# Patient Record
Sex: Female | Born: 1940 | Race: White | Smoking: Never smoker
Health system: Northeastern US, Academic
[De-identification: ages and names within clinical notes are randomized; demographics above are authoritative.]

## PROBLEM LIST (undated history)

## (undated) VITALS — BP 128/62 | HR 56 | Temp 97.3°F | Resp 16 | Ht 66.0 in | Wt 199.0 lb

## (undated) DIAGNOSIS — C50919 Malignant neoplasm of unspecified site of unspecified female breast: Secondary | ICD-10-CM

## (undated) DIAGNOSIS — W5503XA Scratched by cat, initial encounter: Secondary | ICD-10-CM

## (undated) DIAGNOSIS — E785 Hyperlipidemia, unspecified: Secondary | ICD-10-CM

## (undated) DIAGNOSIS — T7840XA Allergy, unspecified, initial encounter: Secondary | ICD-10-CM

## (undated) DIAGNOSIS — IMO0001 Reserved for inherently not codable concepts without codable children: Secondary | ICD-10-CM

## (undated) DIAGNOSIS — M719 Bursopathy, unspecified: Secondary | ICD-10-CM

## (undated) DIAGNOSIS — C8590 Non-Hodgkin lymphoma, unspecified, unspecified site: Secondary | ICD-10-CM

## (undated) DIAGNOSIS — R030 Elevated blood-pressure reading, without diagnosis of hypertension: Secondary | ICD-10-CM

## (undated) DIAGNOSIS — E119 Type 2 diabetes mellitus without complications: Secondary | ICD-10-CM

## (undated) DIAGNOSIS — R600 Localized edema: Secondary | ICD-10-CM

## (undated) DIAGNOSIS — L0390 Cellulitis, unspecified: Secondary | ICD-10-CM

## (undated) DIAGNOSIS — S80812A Abrasion, left lower leg, initial encounter: Secondary | ICD-10-CM

## (undated) HISTORY — DX: Malignant neoplasm of unspecified site of unspecified female breast: C50.919

## (undated) HISTORY — PX: TONSILLECTOMY: SHX28A

## (undated) HISTORY — PX: ROTATOR CUFF REPAIR: SHX139

## (undated) HISTORY — PX: ABDOMINAL HYSTERECTOMY: SHX81

## (undated) HISTORY — DX: Non-Hodgkin lymphoma, unspecified, unspecified site: C85.90

## (undated) HISTORY — DX: Reserved for inherently not codable concepts without codable children: IMO0001

## (undated) HISTORY — DX: Type 2 diabetes mellitus without complications: E11.9

## (undated) HISTORY — PX: APPENDECTOMY: SHX54

## (undated) HISTORY — PX: BILATERAL OOPHORECTOMY: SHX1221

## (undated) HISTORY — DX: Elevated blood-pressure reading, without diagnosis of hypertension: R03.0

## (undated) HISTORY — DX: Allergy, unspecified, initial encounter: T78.40XA

---

## 1990-10-25 DIAGNOSIS — C8590 Non-Hodgkin lymphoma, unspecified, unspecified site: Secondary | ICD-10-CM

## 1990-10-25 HISTORY — DX: Non-Hodgkin lymphoma, unspecified, unspecified site: C85.90

## 2005-09-07 ENCOUNTER — Ambulatory Visit: Payer: Self-pay | Admitting: Family Medicine

## 2005-11-15 ENCOUNTER — Ambulatory Visit: Payer: Self-pay | Admitting: Family Medicine

## 2005-11-22 ENCOUNTER — Ambulatory Visit: Payer: Self-pay | Admitting: Family Medicine

## 2006-01-14 ENCOUNTER — Encounter: Admission: RE | Admit: 2006-01-14 | Discharge: 2006-01-14 | Payer: Self-pay | Admitting: Family Medicine

## 2006-02-18 ENCOUNTER — Encounter: Admission: RE | Admit: 2006-02-18 | Discharge: 2006-02-18 | Payer: Self-pay | Admitting: Family Medicine

## 2006-06-10 ENCOUNTER — Ambulatory Visit: Payer: Self-pay | Admitting: Family Medicine

## 2006-08-12 ENCOUNTER — Ambulatory Visit: Payer: Self-pay | Admitting: Family Medicine

## 2006-08-18 ENCOUNTER — Encounter: Admission: RE | Admit: 2006-08-18 | Discharge: 2006-08-18 | Payer: Self-pay | Admitting: Family Medicine

## 2007-05-17 ENCOUNTER — Ambulatory Visit: Payer: Self-pay | Admitting: Family Medicine

## 2007-05-17 DIAGNOSIS — J309 Allergic rhinitis, unspecified: Secondary | ICD-10-CM | POA: Insufficient documentation

## 2007-05-17 DIAGNOSIS — Z87898 Personal history of other specified conditions: Secondary | ICD-10-CM

## 2007-05-17 DIAGNOSIS — R609 Edema, unspecified: Secondary | ICD-10-CM | POA: Insufficient documentation

## 2007-05-18 LAB — CONVERTED CEMR LAB
Albumin: 3.9 g/dL (ref 3.5–5.2)
Alkaline Phosphatase: 86 units/L (ref 39–117)
BUN: 11 mg/dL (ref 6–23)
Basophils Absolute: 0 10*3/uL (ref 0.0–0.1)
GFR calc Af Amer: 93 mL/min
Hemoglobin: 13.4 g/dL (ref 12.0–15.0)
Lymphocytes Relative: 31.6 % (ref 12.0–46.0)
MCHC: 34.7 g/dL (ref 30.0–36.0)
MCV: 88.1 fL (ref 78.0–100.0)
Monocytes Absolute: 0.5 10*3/uL (ref 0.2–0.7)
Monocytes Relative: 8 % (ref 3.0–11.0)
Neutro Abs: 3.8 10*3/uL (ref 1.4–7.7)
Neutrophils Relative %: 58.4 % (ref 43.0–77.0)
Potassium: 4.2 meq/L (ref 3.5–5.1)
Sodium: 143 meq/L (ref 135–145)
TSH: 4.11 microintl units/mL (ref 0.35–5.50)
Total Protein: 7.1 g/dL (ref 6.0–8.3)

## 2007-05-19 ENCOUNTER — Ambulatory Visit: Payer: Self-pay

## 2007-07-12 ENCOUNTER — Encounter: Admission: RE | Admit: 2007-07-12 | Discharge: 2007-07-12 | Payer: Self-pay | Admitting: Family Medicine

## 2007-10-26 HISTORY — PX: OTHER SURGICAL HISTORY: SHX169

## 2008-06-19 ENCOUNTER — Ambulatory Visit: Payer: Self-pay | Admitting: Family Medicine

## 2008-06-19 DIAGNOSIS — M654 Radial styloid tenosynovitis [de Quervain]: Secondary | ICD-10-CM

## 2008-06-19 DIAGNOSIS — L989 Disorder of the skin and subcutaneous tissue, unspecified: Secondary | ICD-10-CM | POA: Insufficient documentation

## 2008-08-06 ENCOUNTER — Ambulatory Visit: Payer: Self-pay | Admitting: Family Medicine

## 2008-08-06 LAB — CONVERTED CEMR LAB
AST: 59 units/L — ABNORMAL HIGH (ref 0–37)
Albumin: 4 g/dL (ref 3.5–5.2)
BUN: 13 mg/dL (ref 6–23)
Basophils Absolute: 0 10*3/uL (ref 0.0–0.1)
Basophils Relative: 0.4 % (ref 0.0–3.0)
Calcium: 9.2 mg/dL (ref 8.4–10.5)
Cholesterol: 191 mg/dL (ref 0–200)
Creatinine, Ser: 0.9 mg/dL (ref 0.4–1.2)
Eosinophils Absolute: 0 10*3/uL (ref 0.0–0.7)
Eosinophils Relative: 1.2 % (ref 0.0–5.0)
GFR calc Af Amer: 81 mL/min
GFR calc non Af Amer: 67 mL/min
HCT: 41.9 % (ref 36.0–46.0)
MCHC: 35.1 g/dL (ref 30.0–36.0)
MCV: 89.9 fL (ref 78.0–100.0)
Monocytes Absolute: 0.6 10*3/uL (ref 0.1–1.0)
Neutro Abs: 2.1 10*3/uL (ref 1.4–7.7)
Neutrophils Relative %: 54.4 % (ref 43.0–77.0)
RBC: 4.66 M/uL (ref 3.87–5.11)
TSH: 4.16 microintl units/mL (ref 0.35–5.50)
Total Bilirubin: 1 mg/dL (ref 0.3–1.2)
VLDL: 21 mg/dL (ref 0–40)
WBC: 3.9 10*3/uL — ABNORMAL LOW (ref 4.5–10.5)

## 2008-08-07 ENCOUNTER — Encounter: Payer: Self-pay | Admitting: Family Medicine

## 2008-08-08 LAB — CONVERTED CEMR LAB
Bilirubin Urine: NEGATIVE
Glucose, Urine, Semiquant: NEGATIVE
Specific Gravity, Urine: 1.025
WBC Urine, dipstick: NEGATIVE
pH: 5.5

## 2008-08-13 ENCOUNTER — Ambulatory Visit: Payer: Self-pay | Admitting: Family Medicine

## 2008-08-13 DIAGNOSIS — E119 Type 2 diabetes mellitus without complications: Secondary | ICD-10-CM | POA: Insufficient documentation

## 2008-08-13 DIAGNOSIS — E785 Hyperlipidemia, unspecified: Secondary | ICD-10-CM

## 2009-01-06 ENCOUNTER — Ambulatory Visit: Payer: Self-pay | Admitting: Gastroenterology

## 2009-01-10 ENCOUNTER — Telehealth: Payer: Self-pay | Admitting: Gastroenterology

## 2009-01-13 ENCOUNTER — Ambulatory Visit: Payer: Self-pay | Admitting: Gastroenterology

## 2009-01-13 HISTORY — PX: OTHER SURGICAL HISTORY: SHX169

## 2009-08-19 ENCOUNTER — Ambulatory Visit: Payer: Self-pay | Admitting: Family Medicine

## 2009-08-19 LAB — CONVERTED CEMR LAB
Blood in Urine, dipstick: NEGATIVE
Glucose, Urine, Semiquant: NEGATIVE
Ketones, urine, test strip: NEGATIVE
Nitrite: NEGATIVE
Specific Gravity, Urine: 1.02
pH: 5

## 2009-08-27 ENCOUNTER — Ambulatory Visit: Payer: Self-pay | Admitting: Family Medicine

## 2009-08-27 LAB — CONVERTED CEMR LAB
Albumin: 3.9 g/dL (ref 3.5–5.2)
BUN: 13 mg/dL (ref 6–23)
Basophils Absolute: 0 10*3/uL (ref 0.0–0.1)
CO2: 27 meq/L (ref 19–32)
Calcium: 8.8 mg/dL (ref 8.4–10.5)
Creatinine,U: 109.4 mg/dL
Eosinophils Absolute: 0.1 10*3/uL (ref 0.0–0.7)
Glucose, Bld: 171 mg/dL — ABNORMAL HIGH (ref 70–99)
HCT: 40.7 % (ref 36.0–46.0)
Hemoglobin: 14 g/dL (ref 12.0–15.0)
Hgb A1c MFr Bld: 6.6 % — ABNORMAL HIGH (ref 4.6–6.5)
Lymphs Abs: 1.7 10*3/uL (ref 0.7–4.0)
MCHC: 34.5 g/dL (ref 30.0–36.0)
Microalb, Ur: 1.7 mg/dL (ref 0.0–1.9)
Neutro Abs: 3.4 10*3/uL (ref 1.4–7.7)
Platelets: 187 10*3/uL (ref 150.0–400.0)
Potassium: 4.2 meq/L (ref 3.5–5.1)
RDW: 13.9 % (ref 11.5–14.6)
Sodium: 141 meq/L (ref 135–145)
TSH: 4.82 microintl units/mL (ref 0.35–5.50)
Triglycerides: 175 mg/dL — ABNORMAL HIGH (ref 0.0–149.0)

## 2009-09-03 ENCOUNTER — Encounter: Admission: RE | Admit: 2009-09-03 | Discharge: 2009-09-03 | Payer: Self-pay | Admitting: Family Medicine

## 2009-09-05 ENCOUNTER — Encounter (INDEPENDENT_AMBULATORY_CARE_PROVIDER_SITE_OTHER): Payer: Self-pay | Admitting: *Deleted

## 2009-09-05 ENCOUNTER — Encounter: Admission: RE | Admit: 2009-09-05 | Discharge: 2009-09-05 | Payer: Self-pay | Admitting: Family Medicine

## 2009-09-05 HISTORY — PX: DIAGNOSTIC MAMMOGRAM: HXRAD719

## 2009-10-14 ENCOUNTER — Encounter: Admission: RE | Admit: 2009-10-14 | Discharge: 2009-10-22 | Payer: Self-pay | Admitting: Family Medicine

## 2009-10-15 ENCOUNTER — Encounter: Payer: Self-pay | Admitting: Family Medicine

## 2010-05-14 ENCOUNTER — Encounter: Payer: Self-pay | Admitting: Family Medicine

## 2010-07-14 ENCOUNTER — Ambulatory Visit: Payer: Self-pay | Admitting: Family Medicine

## 2010-07-14 DIAGNOSIS — M549 Dorsalgia, unspecified: Secondary | ICD-10-CM | POA: Insufficient documentation

## 2010-07-14 LAB — CONVERTED CEMR LAB
Nitrite: NEGATIVE
Protein, U semiquant: NEGATIVE
Urobilinogen, UA: 0.2
WBC Urine, dipstick: NEGATIVE

## 2010-07-15 ENCOUNTER — Telehealth: Payer: Self-pay | Admitting: Family Medicine

## 2010-07-15 LAB — CONVERTED CEMR LAB
Albumin: 4.3 g/dL (ref 3.5–5.2)
Alkaline Phosphatase: 87 units/L (ref 39–117)
BUN: 16 mg/dL (ref 6–23)
Basophils Absolute: 0 10*3/uL (ref 0.0–0.1)
CO2: 27 meq/L (ref 19–32)
Calcium: 9.6 mg/dL (ref 8.4–10.5)
Creatinine, Ser: 0.9 mg/dL (ref 0.4–1.2)
Creatinine,U: 98.7 mg/dL
Eosinophils Absolute: 0.1 10*3/uL (ref 0.0–0.7)
Glucose, Bld: 224 mg/dL — ABNORMAL HIGH (ref 70–99)
Hemoglobin: 14.2 g/dL (ref 12.0–15.0)
Lymphocytes Relative: 31.7 % (ref 12.0–46.0)
Lymphs Abs: 1.8 10*3/uL (ref 0.7–4.0)
MCHC: 34.8 g/dL (ref 30.0–36.0)
Microalb, Ur: 1 mg/dL (ref 0.0–1.9)
Neutro Abs: 3.4 10*3/uL (ref 1.4–7.7)
RDW: 15 % — ABNORMAL HIGH (ref 11.5–14.6)
Sodium: 137 meq/L (ref 135–145)
TSH: 7 microintl units/mL — ABNORMAL HIGH (ref 0.35–5.50)

## 2010-07-16 ENCOUNTER — Telehealth: Payer: Self-pay | Admitting: Family Medicine

## 2010-07-20 ENCOUNTER — Telehealth: Payer: Self-pay | Admitting: Family Medicine

## 2010-11-15 ENCOUNTER — Encounter: Payer: Self-pay | Admitting: Family Medicine

## 2010-11-24 NOTE — Progress Notes (Signed)
Summary: Please return call  Phone Note Call from Patient Call back at Home Phone [phone]   Caller: Patient---live call Summary of Call: pt wants Dr Clent Ridges to return her call personally regarding her thyroid issue. Refused to speak with a nurse. Initial call taken by: Warnell Forester,  July 16, 2010 1:39 PM  Follow-up for Phone Call        She needs to speak to my nurse or else schedule an OV  Follow-up by: Nelwyn Salisbury MD,  July 17, 2010 8:34 AM  Additional Follow-up for Phone Call Additional follow up Details #1::        pt stated go ahead and call in synthriod to [facility]  and wants to make sure letter will be mailed Additional Follow-up by: Pura Spice, RN,  July 17, 2010 9:09 AM    Additional Follow-up for Phone Call Additional follow up Details #2::    done Follow-up by: Nelwyn Salisbury MD,  July 17, 2010 9:53 AM

## 2010-11-24 NOTE — Assessment & Plan Note (Signed)
Summary: CONSULT RE: BACK ISSUES/CJR   Vital Signs:  Patient profile:   70 year old female Weight:      214 pounds BMI:     38.66 O2 Sat:      97 % Temp:     98.1 degrees F Pulse rate:   89 / minute BP sitting:   140 / 80  (left arm) Cuff size:   large  Vitals Entered By: Pura Spice, RN (July 14, 2010 9:50 AM)  Contraindications/Deferment of Procedures/Staging:    Test/Procedure: FLU VAX    Reason for deferment: patient declined  CC: would like xr back    History of Present Illness: 70 yr old female for a brief wellness evaluation related to her employment. 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 Part of this exam involved Xrays of her spine, and she was told that she had more signs of "degeneration" in her spine than what this company allows their workers to have. Consequently she was fired. Her job involves lifting weights up to about 20 lbs, but it does not invlove any heavy lifting. She recently applied for unemployment benefits, and they told her to get a statement from me to certify that she is healthy enough to work. She does have some mild low back pain and stiffness from time to time, and she gets good relief by taking one or two Aleeve tablets a day. This has never prevented her from working. otherwise she feels fine. Her BP at home is stable in the range of 110-120 over 70-80.   Allergies: 1)  ! Penicillin  Past History:  Past Medical History: Reviewed history from 08/27/2009 and no changes required. Allergic rhinitis Non Hodgkins  lymphoma 1992, resolved elevated BP Diabetes mellitus, type II  Past Surgical History: Reviewed history from 08/27/2009 and no changes required. Appendectomy Hysterectomy Oophorectomy-bilateral removal of basal cell carcinoma from left upper arm 2009 per Dr. Terri Piedra colonoscopy  01-13-09 per Dr. Jarold Motto, diverticulosis only, repeat in 10 yrs  Family History: Reviewed history from 08/13/2008 and no changes required. Family History Breast cancer 1st degree relative <50 Family History of CAD Female 1st degree relative <50 Family History Diabetes 1st degree relative Family History of Prostate CA 1st degree relative <50  Social History: Reviewed history from 08/13/2008 and no changes required. Widow/Widower Alcohol use-yes Drug use-no Regular exercise-yes Former smoker  Review of Systems  The patient denies anorexia, fever, weight loss, weight gain, vision loss, decreased hearing, hoarseness, chest pain, syncope, dyspnea on exertion, peripheral edema, prolonged cough, headaches, hemoptysis, abdominal pain, melena, hematochezia, severe indigestion/heartburn, hematuria, incontinence, genital sores, muscle weakness, suspicious skin lesions, transient blindness, difficulty walking, depression, unusual weight change, abnormal bleeding, enlarged lymph nodes, angioedema, breast masses, and testicular masses.    Physical Exam  General:  overweight-appearing.   Head:  Normocephalic and atraumatic without obvious abnormalities. No apparent alopecia or balding. Eyes:  No corneal or conjunctival inflammation noted. EOMI. Perrla. Funduscopic exam benign, without hemorrhages, exudates or papilledema. Vision grossly normal. Ears:  External ear exam shows no significant lesions or deformities.  Otoscopic examination reveals clear canals, tympanic membranes are intact bilaterally without bulging, retraction, inflammation or discharge. Hearing is grossly normal bilaterally. Nose:  External nasal examination shows no deformity or inflammation. Nasal mucosa are pink and moist without lesions or exudates. Mouth:  Oral mucosa and  oropharynx without lesions or exudates.  Teeth in good repair. Neck:  No deformities, masses, or tenderness noted. Chest Wall:  No deformities, masses, or  tenderness noted. Lungs:  Normal respiratory effort, chest expands symmetrically. Lungs are clear to auscultation, no crackles or wheezes. Heart:  Normal rate and regular rhythm. S1 and S2 normal without gallop, murmur, click, rub or other extra sounds. Abdomen:  Bowel sounds positive,abdomen soft and non-tender without masses, organomegaly or hernias noted. Msk:  No deformity or scoliosis noted of thoracic or lumbar spine.  No tenderness of the spine or neck, full ROM.  Pulses:  R and L carotid,radial,femoral,dorsalis pedis and posterior tibial pulses are full and equal bilaterally Extremities:  No clubbing, cyanosis, edema, or deformity noted with normal full range of motion of all joints.   Neurologic:  No cranial nerve deficits noted. Station and gait are normal. Plantar reflexes are down-going bilaterally. DTRs are symmetrical throughout. Sensory, motor and coordinative functions appear intact. Skin:  Intact without suspicious lesions or rashes Cervical Nodes:  No lymphadenopathy noted Psych:  Cognition and judgment appear intact. Alert and cooperative with normal attention span and concentration. No apparent delusions, illusions, hallucinations   Impression & Recommendations:  Problem # 1:  WELL ADULT EXAM (ICD-V70.0)  Orders: UA Dipstick w/o Micro (automated)  (81003) Venipuncture (36644) TLB-BMP (Basic Metabolic Panel-BMET) (80048-METABOL) TLB-CBC Platelet - w/Differential (85025-CBCD) TLB-Hepatic/Liver Function Pnl (80076-HEPATIC) TLB-TSH (Thyroid Stimulating Hormone) (84443-TSH) Specimen Handling (03474)  Complete Medication List: 1)  Claritin 10 Mg Tabs (Loratadine) .... Once daily 2)  Multivitamins Tabs (Multiple vitamin) .... Once daily 3)  B-12 Microlozenge 500 Mcg Subl (Cyanocobalamin) .... Once daily 4)  Flax Oil (Flaxseed (linseed)) .... Once daily 5)  Red Yeast Rice Powd (Red yeast rice extract) .... Once daily 6)  Vitamin B Complex-c Caps (B complex-c) .... Once  daily 7)  Calcium Carbonate-vitamin D 600-400 Mg-unit Tabs (Calcium carbonate-vitamin d) .... Once daily 8)  Nattokinase 100 Mg Caps (Nattokinase) .... Once daily 9)  Hops Valerian  .... Once daily 10)  Probiotics  .... Once daily 11)  Ash [place]  .... Once daily  Other Orders: TLB-A1C / Hgb A1C (Glycohemoglobin) (83036-A1C) TLB-Microalbumin/Creat Ratio, Urine (82043-MALB) T-Cervical Spine Comp 4 Views (25956LO) T-Thoracic Spine 2 Views (75643PI) T-Lumbar Spine 2 Views (72100TC)  Patient Instructions: 1)  It is important that you exercise reguarly at least 20 minutes 5 times a week. If you develop chest pain, have severe difficulty breathing, or feel very tired, stop exercising immediately and seek medical attention.  2)  You need to lose weight. Consider a lower calorie diet and regular exercise.  3)  Get labs today.  4)  She will get Xrays of the neck and spine later today.  5)  After these results are back, we can make a statement as to her health and ability to work.   Laboratory Results   Urine Tests  Date/Time Recieved: July 14, 2010 11:14 AM  Date/Time Reported: July 14, 2010 11:14 AM   Routine Urinalysis   Color: yellow Appearance: Clear Glucose: 1+   (Normal Range: Negative) Bilirubin: negative   (Normal Range: Negative) Ketone: negative   (Normal Range: Negative) Spec. Gravity: 1.025   (Normal Range: 1.003-1.035) Blood: negative   (Normal Range: Negative) pH: 5.0   (Normal Range: 5.0-8.0) Protein: negative   (Normal Range: Negative) Urobilinogen: 0.2   (Normal Range: 0-1) Nitrite: negative   (Normal Range: Negative) Leukocyte Esterace: negative   (Normal Range: Negative)    Comments: [place]  Jeanmarie Plant, CMA  July 14, 2010 11:14 AM      Appended Document: CONSULT RE: BACK ISSUES/CJR     Allergies: 1)  ! Penicillin   Impression & Recommendations:  Problem # 1:  BACK PAIN (ICD-724.5)  Complete Medication List: 1)  Claritin 10 Mg  Tabs (Loratadine) .... Once daily 2)  Multivitamins Tabs (Multiple vitamin) .... Once daily 3)  B-12 Microlozenge 500 Mcg Subl (Cyanocobalamin) .... Once daily 4)  Flax Oil (Flaxseed (linseed)) .... Once daily 5)  Red Yeast Rice Powd (Red yeast rice extract) .... Once daily 6)  Vitamin B Complex-c Caps (B complex-c) .... Once daily 7)  Calcium Carbonate-vitamin D 600-400 Mg-unit Tabs (Calcium carbonate-vitamin d) .... Once daily 8)  Nattokinase 100 Mg Caps (Nattokinase) .... Once daily 9)  Hops Valerian  .... Once daily 10)  Probiotics  .... Once daily 11)  Ash [place]ag[place]  .... Once daily 12)  Synthroid 50 Mcg Tabs (Levothyroxine sodium) .Marland Kitchen.. 1 by mouth once daily

## 2010-11-24 NOTE — Progress Notes (Signed)
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 Initial call taken by: Pura Spice, RN,  July 15, 2010 1:01 PM  Follow-up for Phone Call        a letter was dictated today Follow-up by: Nelwyn Salisbury MD,  July 15, 2010 1:13 PM  Additional Follow-up for Phone Call Additional follow up Details #1::        pt aware.  Additional Follow-up by: Pura Spice, RN,  July 15, 2010 2:01 PM

## 2010-11-24 NOTE — Progress Notes (Signed)
Summary: REQUEST FOR Rx  Phone Note Call from Patient   Caller:  PATIENT Summary of Call: Pt adv that Rx for Synthroid was not sent into [facility]..... Pt adv they ([facility]) told her that Rx was not received ....Marland KitchenMarland KitchenTherefore, pt wants a written script for medication left up front for her to p/u...Marland KitchenMarland KitchenAlso, requested verification that letter she requested was faxed to [phone] (for unemployment).  Initial call taken by: Debbra Riding,  July 20, 2010 12:18 PM  Follow-up for Phone Call        done  Follow-up by: Pura Spice, RN,  July 20, 2010 12:28 PM    New/Updated Medications: SYNTHROID 50 MCG TABS (LEVOTHYROXINE SODIUM) 1 by mouth once daily Prescriptions: SYNTHROID 50 MCG TABS (LEVOTHYROXINE SODIUM) 1 by mouth once daily  #30 x 11   Entered by:   Pura Spice, RN   Authorized by:   Nelwyn Salisbury MD   Signed by:   Pura Spice, RN on 07/20/2010   Method used:   Print then Give to Patient   RxID:   [id number]   Appended Document: REQUEST FOR Rx Patient informed that the letterhead copy is ready to be picked up.  Appended Document: REQUEST FOR Rx Pt came by office and was provided with copy of letter / document as well as the confirmation that it was faxed to # provided.

## 2010-12-08 ENCOUNTER — Other Ambulatory Visit: Payer: Self-pay | Admitting: Family Medicine

## 2010-12-08 DIAGNOSIS — Z1231 Encounter for screening mammogram for malignant neoplasm of breast: Secondary | ICD-10-CM

## 2010-12-21 ENCOUNTER — Ambulatory Visit
Admission: RE | Admit: 2010-12-21 | Discharge: 2010-12-21 | Disposition: A | Discharge status: Home / Self Care | Payer: Medicare Other | Source: Ambulatory Visit | Attending: Family Medicine | Admitting: Family Medicine

## 2010-12-21 DIAGNOSIS — Z1231 Encounter for screening mammogram for malignant neoplasm of breast: Secondary | ICD-10-CM

## 2011-01-01 ENCOUNTER — Other Ambulatory Visit: Payer: Self-pay | Admitting: Gastroenterology

## 2011-01-01 ENCOUNTER — Other Ambulatory Visit: Payer: Self-pay | Admitting: Cardiology

## 2011-01-01 ENCOUNTER — Ambulatory Visit
Admit: 2011-01-01 | Discharge: 2011-01-01 | Disposition: A | Discharge status: Home / Self Care | Payer: Self-pay | Admitting: Cardiology

## 2011-01-01 ENCOUNTER — Encounter: Payer: Self-pay | Admitting: Gastroenterology

## 2011-01-01 ENCOUNTER — Ambulatory Visit
Admit: 2011-01-01 | Discharge: 2011-01-01 | Disposition: A | Discharge status: Home / Self Care | Payer: Self-pay | Source: Ambulatory Visit | Attending: Cardiology | Admitting: Cardiology

## 2011-01-01 ENCOUNTER — Ambulatory Visit: Admit: 2011-01-01 | Discharge: 2011-01-01 | Disposition: A | Discharge status: Home / Self Care | Payer: Self-pay

## 2011-01-01 ENCOUNTER — Encounter: Payer: Self-pay | Admitting: Cardiology

## 2011-01-01 DIAGNOSIS — I10 Essential (primary) hypertension: Secondary | ICD-10-CM | POA: Insufficient documentation

## 2011-01-01 DIAGNOSIS — R002 Palpitations: Secondary | ICD-10-CM | POA: Insufficient documentation

## 2011-01-01 HISTORY — DX: Palpitations: R00.2

## 2011-01-01 HISTORY — DX: Essential (primary) hypertension: I10

## 2011-01-01 LAB — EKG 12-LEAD
P: 58 degrees
QRS: 44 degrees
Rate: 67 {beats}/min
Severity: NORMAL
Severity: NORMAL
Statement: NORMAL
T: 17 degrees
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
## 2011-01-01 NOTE — Procedures (Signed)
 [facility]   STRESS ECHOCARDIOGRAM     NAME: Stacie Kim  MRN: [id number]  ID: [id number]  DOB: 12-12-1940  PROCEDURE DATE: January 01, 2011  REFERRING PROVIDER: ROBERT MENESES     HT: 2 in  WT: 209 lbs        Medications:  [employer] Med List reviewed with patient   Allergies: NKDA       Location: Outpatient   Technical Quality: Diagnostic.     Echo contrast:   Optison (perflutren lipid microspheres) used in small   diluted incremental doses to enhance endocardial border definition.  IV access:  #20 ga. IV placed in Rt. AF for Optison doses.  IV discontinued   after stress test completed without incident.  Comments:   Indication:  HTN, CHF, Dyspnea     The patient's identity was confirmed using at least two separate   identifiers during this visit and appropriate test/procedure was verified   with physician order : yesencounter form 3/9 Dr. Delton See.     Patient instructed and verbalizes understanding of test procedure: Yes     Other than cardiac symptoms, is patient experiencing pain that impacts   their ability to complete the test?  No    If yes, rate on 0-10 scale:       CARDIAC RISK FACTORS: Hypertension, Family History: Father and Brothers.     Daughters with Arrythmias, Other:       CARDIAC HISTORY   Myocardial infarction: None.   Cardiac catheterization: None.   CABG: None.   PCI/stenting: None.   Prior stress testing:  Stress Echo 2009 in [place].         HEMODYNAMICS     BASELINE / STRESS       --Resting heart rate: 63 bpm                                         --Max heart rate: 130 bpm       --Resting blood pressure: 140/60 mmHg       --Maximum blood pressure:  184/60 mmHg       --Resting EKG analysis: normal sinus rhythm.       --Percent of predicted maximum heart rate achieved: 85 percent            STRESS TEST PROCEDURE: Standard Bruce   per protocol          --Exercise Time: 4 minutes 43 seconds       --METS: 7.0        --Blood Pressure Response:Normal.         --Heart rate response: Normal.           --ECG analysis with stress:  no ischemic changes.         --Arrhythmias: Occasional PVC and  bigeminy with one triplet noted   during exercise.  SVT at a rate of 163, lasting aprox 30 seconds,  noted in   early recovery.         --Chest Pain: None reported.        --Reason for termination: Fatigue. Target heart rate reached.     Dizziness.     DRUGS ADMINISTERED:    Optison, (perflutren lipid microspheres) used in   small diluted incremental doses        BASELINE ECHOCARDIOGRAM     Left ventricular wall motion:  Normal resting left ventricular wall  motion.   LVEF:  55-60 percent    RVdd: 2.7cm; LVed: 4.7cm; IVSd: 1.0cm; PWd: 1.0cm; Ao: 3.2cm; LA: 4.5cm      Mitral regurgitation:  Trace to mild.     Aortic regurgitation: None.      Tricuspid regurgitation:  Trace.      Pulmonic regurgitation:  Trace to mild.               Right ventricular systolic pressure estimate: N/A  Other Findings:   Left atrial enlargement.                      ECHOCARDIOGRAM AT PEAK EXERCISE     Left ventricular wall motion:  Mild augmentation in all segments with   reduction in LV end diastolic dimension.   LVEF: 70 percent  Stress-induced ischemia: None.    OTHER:        CONCLUSIONS  1.  Reduced aerobic capacity with no chest pain with exercise.  Transient   dizziness during exercise, not associated with arrhythmia.  2.  Normal resting LV systolic function with left atrial enlargement and no   significant valvular abnormalities.  3.  Negative stress ECG for ischemia at 85% of max predicted heart rate.    Transient run of supraventricular tachycardia without significant symptoms.  4.  Mild augmentation of LV systolic function with no regional wall motion   abnormalities with stress.  5.  Negative stress echocardiogram for myocardial ischemia at the cardiac   workload achieved.     Sonographer:    Ancil Linsey RDCS, RVT         Tech / RN:  Lowella Grip RN, [facility]         Interpreting Cardiologist:        Honor Junes  MD.  Signature    Electronically signed by: Honor Junes  M.D.; 01/01/2011 2:27 PM EST;   Chartered loss adjuster.

## 2011-01-01 NOTE — Miscellaneous (Unsigned)
 Continuity of Care Record  Created: todo  From: MENESES, ROBERT  From:   From: [employer], EHR v10.2.7.53  To: DESMOND, Bryleigh  Purpose: Patient Use;       Problems  Diagnosis: Hypertension (401.9)   Diagnosis: Palpitations (Symptom) (785.1)     Family History  Family history of Coronary Artery Disease  Family history of Hypertension (V17.49)     Alerts  Allergy - Codeine Derivatives Headache     Medications  Lisinopril 10 MG Tablet ; RPT   PriLOSEC 20 MG Capsule Delayed Release; TAKE 1 CAPSULE DAILY. ; RPT

## 2011-03-12 NOTE — Letter (Signed)
July 15, 2010     RE:  Marisa Davenport, Marisa Davenport  MRN:  [id number]  /  DOB:  Sep 11, 1941   To whom it may concern,   This letter is concerning a patient of mine by the name of Marisa Davenport  (Date of birth Mar 30, 1941).  I have been seeing this patient for  some years as her primary care physician, and I most recently saw her on  July 14, 2010, to address some questions that had been raised about  her medical status.  She recently started a new job and was given a bit  of an employment physical exam, and part of this evaluation included x-  rays of her spine.  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  Consequently, she was told that she could  not work for this company.  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  She is certainly capable of bending and stooping and  lifting small amount of weights which would be involved in such a job.  She has mild back discomfort from time to time which she treats with  some over-the-counter medications effectively.  She asked me to obtain x-  rays here and to reevaluate her employability.   Indeed, we did get x-rays of her cervical spine, thoracic spine, and  lumbar spine yesterday.  The cervical and thoracic spine x-rays showed  mild degenerative changes only and nothing of significance.  Her lumbar  spine x-ray did show some scoliosis and some more advanced degenerative  changes, but nothing of particular significance.  During my examination  of her in the office yesterday, she seemed to have more than adequate  strength of her back with good flexibility and good stability of the  spine and lower body.   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  I see no reason at all that she should be prevented from  performing her expected duties in an adequate and safe fashion.   If I may be of further assistance, please let me know.    Sincerely,      Tera Mater. Clent Ridges, MD  Electronically Signed    SAF/MedQ  DD: 07/15/2010  DT: 07/16/2010  Job #: [id number]

## 2011-04-09 ENCOUNTER — Ambulatory Visit: Payer: Self-pay | Admitting: Cardiology

## 2011-04-23 ENCOUNTER — Ambulatory Visit: Payer: Self-pay | Admitting: Cardiology

## 2011-05-17 ENCOUNTER — Encounter: Payer: Self-pay | Admitting: Family Medicine

## 2011-05-17 ENCOUNTER — Ambulatory Visit (INDEPENDENT_AMBULATORY_CARE_PROVIDER_SITE_OTHER): Payer: Medicare Other | Admitting: Family Medicine

## 2011-05-17 VITALS — BP 150/90 | HR 91 | Temp 99.1°F | Wt 212.0 lb

## 2011-05-17 DIAGNOSIS — S43499A Other sprain of unspecified shoulder joint, initial encounter: Secondary | ICD-10-CM

## 2011-05-17 DIAGNOSIS — S46219A Strain of muscle, fascia and tendon of other parts of biceps, unspecified arm, initial encounter: Secondary | ICD-10-CM

## 2011-05-17 NOTE — Progress Notes (Signed)
  Subjective:    Patient ID: Marisa Davenport, female    DOB: 07/02/1941, 70 y.o.   MRN: [id number]  HPI Here to follow up on a work injury that occurred on 04-23-11 while she was lifting a bed mattress. She felt a "pop" and a sudden sharp pain in the right shoulder. Over the next 24 hours she developed a large lump in the upper right arm and some bruising there. The arm has felt weak and been painful ever since.    Review of Systems  Constitutional: Negative.   Respiratory: Negative.   Cardiovascular: Negative.        Objective:   Physical Exam  Constitutional: She appears well-developed and well-nourished.  Musculoskeletal:       The anterior right shoulder is tender. ROM is full but flexing the upper arm is painful           Assessment & Plan:  Probable torn biceps tendon. Refer to orthopedics.

## 2011-05-21 ENCOUNTER — Ambulatory Visit (INDEPENDENT_AMBULATORY_CARE_PROVIDER_SITE_OTHER): Payer: Medicare Other | Admitting: Family Medicine

## 2011-05-21 ENCOUNTER — Encounter: Payer: Self-pay | Admitting: Family Medicine

## 2011-05-21 VITALS — BP 138/90 | HR 82 | Temp 98.8°F | Wt 210.0 lb

## 2011-05-21 DIAGNOSIS — K529 Noninfective gastroenteritis and colitis, unspecified: Secondary | ICD-10-CM

## 2011-05-21 DIAGNOSIS — K5289 Other specified noninfective gastroenteritis and colitis: Secondary | ICD-10-CM

## 2011-05-21 LAB — BASIC METABOLIC PANEL
BUN: 17 mg/dL (ref 6–23)
Chloride: 100 mEq/L (ref 96–112)
Creatinine, Ser: 0.9 mg/dL (ref 0.4–1.2)
Glucose, Bld: 115 mg/dL — ABNORMAL HIGH (ref 70–99)
Potassium: 2.9 mEq/L — ABNORMAL LOW (ref 3.5–5.1)

## 2011-05-21 LAB — CBC WITH DIFFERENTIAL/PLATELET
Basophils Absolute: 0 10*3/uL (ref 0.0–0.1)
Eosinophils Relative: 1.6 % (ref 0.0–5.0)
HCT: 38.9 % (ref 36.0–46.0)
Lymphocytes Relative: 26.5 % (ref 12.0–46.0)
Lymphs Abs: 1.7 10*3/uL (ref 0.7–4.0)
Monocytes Relative: 10.4 % (ref 3.0–12.0)
Neutrophils Relative %: 61.1 % (ref 43.0–77.0)
Platelets: 260 10*3/uL (ref 150.0–400.0)
RDW: 14.5 % (ref 11.5–14.6)
WBC: 6.6 10*3/uL (ref 4.5–10.5)

## 2011-05-21 LAB — HEPATIC FUNCTION PANEL
ALT: 46 U/L — ABNORMAL HIGH (ref 0–35)
AST: 34 U/L (ref 0–37)
Albumin: 3.9 g/dL (ref 3.5–5.2)
Total Bilirubin: 1 mg/dL (ref 0.3–1.2)

## 2011-05-21 LAB — AMYLASE: Amylase: 21 U/L — ABNORMAL LOW (ref 27–131)

## 2011-05-21 LAB — POCT URINALYSIS DIPSTICK
Leukocytes, UA: NEGATIVE
Nitrite, UA: NEGATIVE
Protein, UA: NEGATIVE
Urobilinogen, UA: 0.2
pH, UA: 5

## 2011-05-21 LAB — LIPASE: Lipase: 25 U/L (ref 11.0–59.0)

## 2011-05-21 MED ORDER — CIPROFLOXACIN HCL 500 MG PO TABS: 500.0000 mg | ORAL_TABLET | Freq: Two times a day (BID) | ORAL | Status: AC

## 2011-05-21 MED ORDER — PROMETHAZINE HCL 25 MG PO TABS: 25.0000 mg | ORAL_TABLET | Freq: Four times a day (QID) | ORAL | Status: AC | PRN

## 2011-05-21 MED ORDER — METRONIDAZOLE 500 MG PO TABS: 500.0000 mg | ORAL_TABLET | Freq: Two times a day (BID) | ORAL | Status: AC

## 2011-05-21 NOTE — Progress Notes (Signed)
  Subjective:    Patient ID: Marisa Davenport, female    DOB: 05/07/1941, 70 y.o.   MRN: [id number]  HPI Here for one week of nausea and vomiting, mild diffuse abdominal cramps, and diarrhea. No fever. She is able to keep liquids down but cannot eat much solid food.    Review of Systems  Constitutional: Negative.   Respiratory: Negative.   Cardiovascular: Negative.   Gastrointestinal: Positive for nausea, vomiting, abdominal pain and diarrhea. Negative for constipation, blood in stool, abdominal distention and anal bleeding.       Objective:   Physical Exam  Constitutional: She appears well-developed and well-nourished.  Abdominal: Soft. Bowel sounds are normal. She exhibits no distension and no mass. There is no rebound and no guarding.       Mild diffuse tenderness          Assessment & Plan:   Treat with Cipro and Flagyl. Get labs.

## 2011-08-09 ENCOUNTER — Encounter: Payer: Self-pay | Admitting: Cardiology

## 2011-08-09 ENCOUNTER — Ambulatory Visit: Payer: Self-pay | Admitting: Cardiology

## 2011-08-09 ENCOUNTER — Ambulatory Visit: Payer: Self-pay

## 2011-08-09 ENCOUNTER — Ambulatory Visit
Admit: 2011-08-09 | Discharge: 2011-08-09 | Disposition: A | Discharge status: Home / Self Care | Payer: Self-pay | Source: Ambulatory Visit | Attending: Cardiology | Admitting: Cardiology

## 2011-08-09 VITALS — BP 132/72 | HR 65 | Ht 66.0 in | Wt 186.0 lb

## 2011-08-09 DIAGNOSIS — R079 Chest pain, unspecified: Secondary | ICD-10-CM

## 2011-08-09 DIAGNOSIS — I471 Supraventricular tachycardia: Secondary | ICD-10-CM

## 2011-08-09 DIAGNOSIS — I10 Essential (primary) hypertension: Secondary | ICD-10-CM

## 2011-08-09 DIAGNOSIS — R002 Palpitations: Secondary | ICD-10-CM

## 2011-08-09 MED ORDER — METOPROLOL TARTRATE 25 MG PO TABS *I*
25.0000 mg | ORAL_TABLET | Freq: Two times a day (BID) | ORAL | Status: DC
Start: 2011-08-09 — End: 2011-08-13

## 2011-08-09 NOTE — Progress Notes (Signed)
Dear Dr. Willette Pa:     [name], a 70 y.o. Female presents for follow up of  palpitations.    HPI: Stacie Kim presents with recurrent palpitations.  She states that over the past few months she has noted increased feelings of palpitations, associated with some mild chest tightness and shortness of breath.  The symptoms can occur at rest in the evening, but also seemed to be brought on by physical activity to a certain extent.  She was restarted on metoprolol tartrate once per day, with perhaps mild improvement in her symptoms.  She is currently on treatment for a UTI with ciprofloxacin, which may have slightly worsened her palpitations.  She denies orthopnea, PND or lower extremity edema.  She happily reports roughly 20 pound weight loss through dietary modifications and increased physical exercise.    Past Medical History   Diagnosis Date   . Hypertension 01/01/2011   . Palpitations (Symptom) 01/01/2011     Past Surgical History   Procedure Date   . Cholecystectomy      Cholecystectomy Conversion Data    . Hysterectomy      Hysterectomy Conversion Data        ROS: 10 point review of systems otherwise negative except as per HPI.    Allergies: Codeine and No known latex allergy    Medications:  Patient's Medications   New Prescriptions    METOPROLOL (LOPRESSOR) 25 MG TABLET    Take 1 tablet (25 mg total) by mouth 2 times daily     Previous Medications    CIPROFLOXACIN (CIPRO) 500 MG TABLET    Take 500 mg by mouth 2 times daily     Modified Medications    No medications on file   Discontinued Medications        Provider must reconcile medications    LISINOPRIL (PRINIVIL,ZESTRIL) 10 MG TABLET        METOPROLOL (TOPROL-XL) 50 MG 24 HR TABLET    Take 50 mg by mouth daily   Do not crush or chew. May be divided.     OMEPRAZOLE (PRILOSEC) 20 MG CAPSULE    TAKE 1 CAPSULE DAILY.        Vital Signs:  Blood pressure  132/72, pulse 65, height 1.676 m (5\' 6" ), weight 84.369 kg (186 lb).  Body mass index is 30.02 kg/(m^2).     GENERAL: Well appearing pleasant middle-aged white female. NAD. Color good.  NECK: Trachea midline, no stridor. Carotid upstrokes normal with no bruits. JVP normal.  RESPIRATORY: Normal bilateral breath sounds without wheezes, rales or rhonchi. No dullness to percussion.  COR: Normal precordium and PMI. Normal S1 and S2, regular. No murmur, rub, or gallops.  ABD: soft, nontender, nondistended, normoactive bowel sounds, no organomegaly.  VASCULAR: 2+ pulses at radial, dorsalis pedis, and posterior tibial pulses bilaterally. Capillary refill within 2 seconds. No signs of venous insufficiency.   EXTREMITIES: No cyanosis, clubbing, or edema.    Cholesterol Profile:  No results found for this basename: CHOL, HDL, LDLC, TRIG, CHHDC      No results found for this basename: na, K, Cl, co2, UN, CR, GLU, GFR     12 Lead ECG: Normal sinus rhythm 65 beats per minute.    Most recent cardiac testing: Stress echocardiogram 01/01/11    CONCLUSIONS   1. Reduced aerobic capacity with no chest pain with exercise. Transient   dizziness during exercise, not associated with arrhythmia.   2. Normal resting LV systolic function with left atrial enlargement  and no   significant valvular abnormalities.   3. Negative stress ECG for ischemia at 85% of max predicted heart rate.   Transient run of supraventricular tachycardia without significant symptoms.   4. Mild augmentation of LV systolic function with no regional wall motion   abnormalities with stress.   5. Negative stress echocardiogram for myocardial ischemia at the cardiac   workload achieved.      Assessment: Stacie Kim is a 70 y.o. Female with history of hypertension, previous presumed diastolic heart failure and paroxysmal SVT during the recovery period of her previous stress echocardiogram.  She presents with intermittent palpitations over the past few months, concerning for  paroxysmal SVT.  Otherwise, she appears quite well, with no signs of CHF and achievement of weight loss.    Plan:   1.  Palpitations- I recommended increasing her metoprolol tartrate and 25 mg twice a day, and updated her prescription.  I also recommended proceeding with a Holter monitor, which was fitted for her today in the office.  She will mail back the monitor to avoid having to drive all the way back to [place].  We will plan to see her in followup in one month, but I encouraged her to be in contact with [place] regarding the progress of her symptoms.  We discussed the possible consideration of a EP study with ablation of SVT if this seems to be correlating with her symptoms.    2.  Diastolic CHF- She appears quite euvolemic.  She has adequate blood pressure control on metoprolol alone.    I will see her in followup in one month, sooner if needed.    Thank you for the opportunity of sharing in the care of this patient.  We look forward to working with you in the future.    Honor Junes, MD  [facility]

## 2011-08-13 ENCOUNTER — Telehealth: Payer: Self-pay | Admitting: Cardiology

## 2011-08-13 DIAGNOSIS — R002 Palpitations: Secondary | ICD-10-CM

## 2011-08-13 MED ORDER — METOPROLOL TARTRATE 25 MG PO TABS *I*
25.0000 mg | ORAL_TABLET | Freq: Two times a day (BID) | ORAL | Status: AC
Start: 2011-08-13 — End: 2011-09-12

## 2011-08-13 NOTE — Telephone Encounter (Signed)
Hi NP's,    Patient called to say that Dr. Delton See was going to send Metoprolol to [facility] in [place] [employer]) and she has checked for three days and [employer] says they don't have it. Would you please send the Rx and call her [phone]) to confirm it's been sent?    If you respond to this message please send to [facility] [employer].  Thanks.  Truddie Hidden

## 2011-08-13 NOTE — Telephone Encounter (Signed)
Reordered even though it was ordered by Alycia Rossetti on 10/15.  Called pt and explained that it was resent.

## 2011-09-13 ENCOUNTER — Ambulatory Visit: Payer: Self-pay | Admitting: Cardiology

## 2011-10-28 ENCOUNTER — Ambulatory Visit
Admit: 2011-10-28 | Disposition: A | Discharge status: Home / Self Care | Payer: Self-pay | Source: Ambulatory Visit | Attending: Cardiology | Admitting: Cardiology

## 2011-11-01 ENCOUNTER — Ambulatory Visit: Payer: Self-pay | Admitting: Cardiology

## 2011-12-23 ENCOUNTER — Encounter: Payer: Self-pay | Admitting: Cardiology

## 2012-01-10 ENCOUNTER — Ambulatory Visit: Payer: Self-pay | Admitting: Cardiology

## 2012-07-13 ENCOUNTER — Encounter: Payer: Self-pay | Admitting: Gastroenterology

## 2012-08-03 ENCOUNTER — Encounter: Payer: Self-pay | Admitting: Breast Surgery

## 2012-08-03 DIAGNOSIS — N6099 Unspecified benign mammary dysplasia of unspecified breast: Secondary | ICD-10-CM | POA: Insufficient documentation

## 2012-08-07 ENCOUNTER — Other Ambulatory Visit: Payer: Self-pay | Admitting: Breast Surgery

## 2012-08-08 ENCOUNTER — Ambulatory Visit
Admit: 2012-08-08 | Discharge: 2012-08-08 | Disposition: A | Discharge status: Home / Self Care | Payer: Self-pay | Source: Ambulatory Visit | Attending: Breast Surgery | Admitting: Breast Surgery

## 2012-08-08 ENCOUNTER — Ambulatory Visit: Payer: Self-pay | Admitting: Breast Surgery

## 2012-08-08 VITALS — BP 159/60 | HR 61 | Temp 97.5°F | Resp 18 | Ht 66.93 in | Wt 201.3 lb

## 2012-08-08 DIAGNOSIS — N6099 Unspecified benign mammary dysplasia of unspecified breast: Secondary | ICD-10-CM

## 2012-08-08 NOTE — Patient Instructions (Addendum)
[facility] 10/24 AT 3:00PM

## 2012-08-09 LAB — SURGICAL PATHOLOGY

## 2012-08-14 ENCOUNTER — Encounter: Payer: Self-pay | Admitting: Breast Surgery

## 2012-08-14 NOTE — H&P (Signed)
Stacie Kim  [id number]    Referring Physician: Delaney Meigs  [address]  [place], [place] [address]    Chief Complaint: The patient is referred for consultation regarding management of her abnormal breast imaging.    HPI: Stacie Kim is a pleasant 71 y.o. White female seen on 08/08/2012 for consultation in [facility]. She was in her usual state of good health until 07/18/12 when she underwent her routine screening mammogram at [facility] that showed fibroglandular densities with a 9 mm asymmetry in the  upper outer quadrant of the right breast. BIRAD category 0, incomplete. Right diagnostic imaging was performed on 07/19/12 at [facility], including digital mammographic images with true lateral and spot compression/magnification views and right ultrasound which confirmed the mass with partially obscured margins in the UOQ of the right breast (10:00). The mass measured 0.9 cm on mammogram and 0.6 cm on ultrasound. BIRAD category 4, suspicious.   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 Unfortunately the post biopsy clip migrated quite a bit and no longer represents the area of biopsy.   Stacie Kim was asymptomatic at the time of diagnosis. She comes in today to discuss her management options.     Past Breast History:   She denies any prior breast masses, nipple discharge, skin changes or significant breast pain. She denies any prior breast biopsies or other breast surgery. She has been getting mammograms every year since the age of 48. She does perform breast self examination every few months.    Gynecologic History:  She underwent menarche at the age of 80 and had regular periods every 28 days. She is postmenopausal. No LMP recorded. Patient has had a hysterectomy. She has never used an IUD or hormonal birth control. She is G5P5. She had her first child  at the age of 70. She did breast feed her children for 5 months. She has never been evaluated for infertility.  Her ovaries are intact She underwent surgical menopause at the age of 59. She has not been using HRT.    Medical History:   Past Medical History   Diagnosis Date   . Hypertension 01/01/2011   . Palpitations (Symptom) 01/01/2011         Medications:   Current Outpatient Prescriptions   Medication Sig   . METOPROLOL TARTRATE By 25 mg no specified route   . pantoprazole (PROTONIX) 20 MG EC tablet Take 20 mg by mouth daily   Swallow whole. Do not crush, break, or chew.   . ciprofloxacin (CIPRO) 500 MG tablet Take 500 mg by mouth 2 times daily       No current facility-administered medications for this visit.       Allergies: is allergic to codeine and no known latex allergy.    Surgical history:   Past Surgical History   Procedure Laterality Date   . Cholecystectomy       Cholecystectomy Conversion Data    . Hysterectomy       Hysterectomy Conversion Data        Social:   History     Social History   . Marital Status: Legally Separated     Spouse Name: N/A     Number of Children: N/A   . Years of Education: N/A     Occupational History   . Not on file.  Social History Main Topics   . Smoking status: Never Smoker    . Smokeless tobacco: Never Used   . Alcohol Use: No   . Drug Use: No   . Sexually Active: Yes -- Female partner(s)     Other Topics Concern   . Not on file     Social History Narrative   . No narrative on file       Problem List:    Patient Active Problem List   Diagnosis Code   . Hypertension 401.9   . Palpitations (Symptom) 785.1   . 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 Conversion Other      20120309^Hypertension^401.9^Active^   . Conversion Other      [id number] Artery ZOXWRUE^454.00^Active^   . Breast cancer Neg Hx    . Ovarian cancer Neg Hx       No family status information on file.       Review of  Systems: Comprehensive ROS was taken on the patient intake form and reviewed with the patient. Pertinent items are noted in HPI.     Physical Exam:  Vitals: BP 159/60  Pulse 61  Temp(Src) 36.4 C (97.5 F) (Temporal)  Resp 18  Ht 5' 6.93" (1.7 m)  Wt 201 lb 4.5 oz (91.3 kg)  BMI 31.59 kg/m2  General appearance: alert, appears stated age and cooperative  Head: Normocephalic, without obvious abnormality, atraumatic  Eyes: conjunctivae/corneas clear. PERRL, EOM's intact.  Ears: External ears are normal, hearing is grossly intact.  Nose: Nares normal and patent.  Throat: lips, mucosa, and tongue normal; teeth and gums normal  Neck: no adenopathy and supple, symmetrical, trachea midline  Back: symmetric, no curvature. ROM normal. No CVA tenderness.  Lungs: clear to auscultation bilaterally  Heart: regular rate and rhythm, S1, S2 normal, no murmur, click, rub or gallop  Abdomen: Soft, nontender, nondistended, no organomegaly, no palpable masses.  Extremities: extremities normal, atraumatic, no cyanosis or edema  Pulses: 2+ and symmetric  Skin: Skin color, texture, turgor normal. No rashes or lesions  Lymph nodes: Cervical, supraclavicular, and axillary nodes normal.  Neurologic: Grossly normal  Breasts: Comprehensive breast examination was performed in the seated and supine positions. The breasts are of medium size and moderately ptotic/pendulous. The breasts are symmetric with normal shape and contour. The skin is without erythema, edema, nodules or other lesions. Nipples are normal and everted.There are no skin changes on arm maneuvers. On palpation there are no masses palpable in either breast. No discharge is expressible.      Imaging:     All breast imaging studies were personally reviewed by [place] and findings are per the HPI.    Pathology:   All pathology reports were personally reviewed by [place] and findings are per the HPI. Slides have been requested for our review.       Impression/Plan: Stacie Kim is a  lovely 71 y.o. year old woman with a new area of suspicious architectural distortion seen on mammogram. Stereotactic core biopsy showed atypical ductal hyperplasia.   Based on the modified Gail model, her risk of developing breast cancer in the next 5 years  is 4.4% and her lifetime risk is 13%, compared to 2.2% and 6.6% in the average 69 y.o. y.o. year old woman.    We reviewed her workup to date.  We discussed the clinical significance of atypical  ductal hyperplasia.  ADH is felt to be a biomarker for increased breast cancer risk imparting a 4 fold increase in the risk of developing breast cancer anywhere where in either breast. Because it is simply a risk factor rather than a pre-malignant condition, there is no treatment per se for this condition.     That being said there is a significant risk of under-staging with a core biopsy that shows atypical hyperplasia.  Depending on the study the risk of upstaging from ADH to DCIS is on the order of 10-30%. Because of this the general recommendation is for wider excision to exclude more significant disease in the local area.  At this time her alternatives include observation with high risk surveillance versus standard surgical needle directed excisional biopsy versus site select stereotactic excisional biopsy. Site Select excisional biopsy is a relatively new, minimally invasive technique that excises an intact specimen of 10, 15, or 22mm diameter, depending on the size of the device used. Because of the stereotactic localization, the area of concern can be targeted with 1mm accuracy, allowing much more precise excision than with the standard surgical approach. Therefore, less tissue can be removed while achieving the same goals: complete excision of the mammographic lesion and prior biopsy site while maintaining the ability to orient the specimen and evaluate the margins of excision. It can be used in lesions up to 1.5cm in diameter and still remove adequate tissue to  have negative margins if a cancer is found. This is done as a combined procedure with the mammographer and the surgeon in the breast imaging stereotactic suite. The risks and benefits of the various alternatives were discussed.  At the conclusion of our discussion she voiced a desire to pursue stereotactic site select excisional biopsy.  We will repeat a mammogram today to make sure that we can localize the area of the mammographic abnormality. If this is possible, we will schedule this in the next few weeks.     In terms of her risk management, we reviewed recommendations for high risk surveillance.  In general we feel that patients who are at high risk for developing breast cancer should undergo clinical breast examination every 6 month basis and annual screening mammogram.  If their lifetime risk is greater than 20% based on a modified Gail model, annual screening MRI is recommended. If both screening MRI and screening mammography are being done we recommend that they be offset by 6 months of this she is getting some form of breast imaging every 6 months.     We discussed discussed the role of selective estrogen receptor modulators for risk reduction.  Based on the NSABP-P1 study, tamoxifen confers a 50% reduction in the risk of subsequent breast cancer in women who are at increased risk of breast cancer, as defined as a Gail model 5-year risk of 1.67% or higher.  The Star trial shows an equivalent effect of raloxifene. She was too overwhelmed to make a decision about this at this time and we will discuss her options for chemoprevention further at a future visit.

## 2012-08-16 ENCOUNTER — Telehealth: Payer: Self-pay

## 2012-08-16 NOTE — Telephone Encounter (Signed)
Message copied by Norvel Richards on Wed Aug 16, 2012  4:17 PM  ------       Message from: Budd Palmer       Created: Fri Aug 11, 2012  4:03 PM       Regarding: Breast Imaging       Contact: [phone]                Please call patient on Tues.08/15/12 to go over the Breast Procedure Pre Call Information Sheet.   Patient is scheduled for [facility] Stereotactic Biopsy on Thurs.08/17/12 at 3:00 pm. She should arrive at 2:30 pm.              Please document in an erecord phone encounter using the smart text called [facility] Breast Imaging Pre-call. (smart text found in documentation > create note)              Thanks!                ------

## 2012-08-16 NOTE — Telephone Encounter (Signed)
Attempted to call pt, no voice mailbox set up.  Unable to leave msg for pt.                                         Breast Biopsy Pre-Call    BREAST IMAGING PRE-CALL    Following information given to patient for day of procedure: no   Patient told to eat a light meal (No need to fast)   Patient instructed not to use powder or deodorant under arms or breasts.   Patient instructed to wear a comfortable, 2 piece outfit and supportive and/or sports bra.   Instructed patient to take their prescribed medications, unless they were instructed to stop; take any medications for diabetes, heart, or high blood pressure.   Encouraged patient to bring someone to drive them home, but it is not necessary.    Instruct patient to arrive 15 minutes prior to procedure.   Patient instructed that they would  be awake for the biopsy but they will receive local anesthetic(lidocaine) to numb the biopsy area.   Patient told that they should plan to be at the [facility] or MRI for 2-3 hours total.   Instructed patient that a Radiologist and technologist would be with them during the entire procedure.   Patient instructed that there will also be a registered nurse available for them, if  Needed.   Plan to return to work unless your job requires lifting.   A nurse will call the patient the day after the procedure to see how you are doing.   The Radiologist or technologist will call the patient with results 48 hours after the biopsy.

## 2012-08-17 ENCOUNTER — Encounter: Payer: Self-pay | Admitting: Breast Surgery

## 2012-08-17 ENCOUNTER — Ambulatory Visit: Payer: Self-pay | Admitting: Breast Surgery

## 2012-08-17 ENCOUNTER — Ambulatory Visit
Admit: 2012-08-17 | Discharge: 2012-08-17 | Disposition: A | Discharge status: Home / Self Care | Payer: Self-pay | Source: Ambulatory Visit | Attending: Breast Surgery | Admitting: Breast Surgery

## 2012-08-17 ENCOUNTER — Telehealth: Payer: Self-pay

## 2012-08-17 VITALS — BP 145/67 | HR 62 | Temp 97.5°F | Resp 18 | Ht 66.93 in | Wt 199.7 lb

## 2012-08-17 DIAGNOSIS — Z9889 Other specified postprocedural states: Secondary | ICD-10-CM | POA: Insufficient documentation

## 2012-08-17 DIAGNOSIS — N6099 Unspecified benign mammary dysplasia of unspecified breast: Secondary | ICD-10-CM

## 2012-08-17 NOTE — Telephone Encounter (Signed)
Breast Biopsy Pre-Call   Pre procedure call made to Kory Dimarzio for Site Select biopsy scheduled on 08/17/12 at 3:00pm   Spoke with patient  Inform Radiologist if patient taking any of the following   Do you take Coumadin/Heparin/Plavix/Lovenox? No   If on Coumadin, has patient had a recent PTT? NA   If on Heparin, has patient had a recent INR? NA   Do you take aspirin? No   BREAST IMAGING PRE-CALL   Following information given to patient for day of procedure: yes   Patient told to eat a light meal (No need to fast)   Patient instructed not to use powder or deodorant under arms or breasts. (she has already left for [facility]- will remove when she arrives)  Patient instructed to wear a comfortable, 2 piece outfit and supportive and/or sports bra.   Instructed patient to take their prescribed medications, unless they were instructed to stop; take any medications for diabetes, heart, or high blood pressure.   Encouraged patient to bring someone to drive them home, but it is not necessary.   Instruct patient to arrive 15 minutes prior to procedure.   Patient instructed that they would be awake for the biopsy but they will receive local anesthetic(lidocaine) to numb the biopsy area.   Patient told that they should plan to be at the [facility] or MRI for 2-3 hours total.   Instructed patient that a Radiologist and technologist would be with them during the entire procedure.  Patient instructed that there will also be a registered nurse available for them, if Needed.   Plan to return to work unless your job requires lifting.  A nurse will call the patient the day after the procedure to see how you are doing.   The Radiologist or technologist will call the patient with results 48 hours after the biopsy.  08/17/12 11:15am; CMPeters, RN

## 2012-08-17 NOTE — Patient Instructions (Addendum)
Discharge Instructions    Breast Site Select Excisional Biopsy Postoperative Instructions    Wound Care:    The wound is sealed with tissue glue. Do not pick at the glue. It will begin to peel off on its own after 1-2 weeks, at which point you can pull it off.     You may shower beginning the day after your surgery. Pat your incision(s) dry afterwards. Do not soak your incision in a tub, hot tub, or pool for 7 days after your surgery.      The stitches are all under the skin and will dissolve on their own. They do not need to be removed.     You may benefit from wearing a sports bra or snuggly fitting bra around the clock for the first 3-5 days. This will compress the breast and may keep you much more comfortable.     Avoid jogging, aerobics or other strenuous activity for 2 weeks to allow the breast to heal. Walking or exercising on a stationary bicycle is permitted. (In other words, you don't want to engage in any activities that shake up your breast too much. It can cause pain and bleeding)    Postoperative Pain Management:   You should not have a lot of pain. The local anesthetic you received will wear off after 6-8 hours. For pain, use Tylenol or Ibuprofen (Advil, Aleve, Motrin, etc).    When to call:   Call if you have bleeding, severe pain, swelling, redness around the wound, or fever.     Call if you have any questions.    We will call you with the results.    [facility]  Telephone: [phone]

## 2012-08-17 NOTE — Procedures (Signed)
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     Additional findings (including unexpected complications):  none    SURGEON: Zenda Alpers, MD        ANESTHESIA: Local anesthesia using 1% lidocaine and 0.5% Marcaine 50/50   mixture by local infiltration.     ESTIMATED BLOOD LOSS: 10 cc.     INDICATIONS FOR PROCEDURE: The patient is a 71 y.o. year-old woman who on recent screening mammogram was noted to have an area of mass. Core biopsy showed ADH. The mammographic abnormality was fairly small and amenable to excision with a 22-mm SiteSelect. The patient was given the options of standard needle localization excisional biopsy versus an attempt at adequate excision using the 22-mm SiteSelect device and stereotactic localization. She opted for the SiteSelect procedure. The risks, benefits, and alternatives were discussed. She understands that should cancer be found and any of the margins be inadequate, she will require surgical excision.     DESCRIPTION OF PROCEDURE: After informed consent was obtained and the surgical site was marked, the patient was taken to the stereotactic room and positioned in a prone position on the stereotactic table. The right breast was put into compression and scout films were taken. 30-degree offset images were used to localize the lesion in conjunction with Dr. Royanne Foots. Unfortunately, the clip had migrated a fair distance, and so Dr Gershon Crane focused the localization on the mass.The target was centered over the lesion. The localization needle was advanced and 30-degree offset views were taken to confirm adequate localization. Once the localization was  assured, the wire was deployed. An approximately 2-cm skin incision was made after local anesthesia was achieved using 1% lidocaine and 0.5% Marcaine 50/50 mixture by local infiltration. The skin edges were retracted and the device was advanced to the appropriate depth. The entire area of tissue surrounding the tip of the guidewire was excised using the 22-mm SiteSelect device. Specimen mammogram confirmed capture of the mass.     Hemostasis was achieved using direct pressure. Hemoclips were deployed to mark the approximate area of the abnormality. Post clip 30-degree offset images were taken to confirm clip placement. The patient was then taken out of compression and turned over   into a supine position. Once hemostasis was assured, the wound was then closed in two layers. Subcutaneous tissues were approximated with interrupted 3-0 Vicryl suture. Skin was closed with running 4-0 Monocryl subcuticular closure. Dermabond tissue glue was applied. Both specimens were inked and placed into formalin. The specimens were then hand-carried to pathology. The patient tolerated the procedure well. There were no complications. Final sponge and needle counts were correct at the end of the case.     Estimated Blood Loss:  Minimal  Packing:  No  Drains:  No  Fluid Totals:  NA  Specimens to Pathology:  yes  Patient Condition:  good    Signed:  Zenda Alpers, MD   on 08/17/2012 at 3:06 PM

## 2012-08-18 ENCOUNTER — Encounter: Payer: Self-pay | Admitting: Family Medicine

## 2012-08-18 ENCOUNTER — Ambulatory Visit (INDEPENDENT_AMBULATORY_CARE_PROVIDER_SITE_OTHER): Payer: Medicare Other | Admitting: Family Medicine

## 2012-08-18 VITALS — BP 130/80 | HR 80 | Temp 97.7°F | Resp 20 | Ht 63.0 in | Wt 207.0 lb

## 2012-08-18 DIAGNOSIS — E785 Hyperlipidemia, unspecified: Secondary | ICD-10-CM

## 2012-08-18 DIAGNOSIS — R32 Unspecified urinary incontinence: Secondary | ICD-10-CM | POA: Insufficient documentation

## 2012-08-18 DIAGNOSIS — M549 Dorsalgia, unspecified: Secondary | ICD-10-CM

## 2012-08-18 DIAGNOSIS — Z23 Encounter for immunization: Secondary | ICD-10-CM

## 2012-08-18 DIAGNOSIS — Z87898 Personal history of other specified conditions: Secondary | ICD-10-CM

## 2012-08-18 DIAGNOSIS — E119 Type 2 diabetes mellitus without complications: Secondary | ICD-10-CM

## 2012-08-18 LAB — CBC WITH DIFFERENTIAL/PLATELET
Basophils Absolute: 0 10*3/uL (ref 0.0–0.1)
HCT: 40.4 % (ref 36.0–46.0)
Lymphs Abs: 1.4 10*3/uL (ref 0.7–4.0)
Monocytes Relative: 10.3 % (ref 3.0–12.0)
Neutrophils Relative %: 55.2 % (ref 43.0–77.0)
Platelets: 172 10*3/uL (ref 150.0–400.0)
RDW: 15.4 % — ABNORMAL HIGH (ref 11.5–14.6)

## 2012-08-18 LAB — POCT URINALYSIS DIPSTICK
Leukocytes, UA: NEGATIVE
Nitrite, UA: NEGATIVE
Protein, UA: NEGATIVE
Spec Grav, UA: 1.025
Urobilinogen, UA: 0.2

## 2012-08-18 LAB — HEMOGLOBIN A1C: Hgb A1c MFr Bld: 6.3 % (ref 4.6–6.5)

## 2012-08-18 LAB — BASIC METABOLIC PANEL
CO2: 26 mEq/L (ref 19–32)
Chloride: 106 mEq/L (ref 96–112)
Creatinine, Ser: 0.9 mg/dL (ref 0.4–1.2)
Glucose, Bld: 158 mg/dL — ABNORMAL HIGH (ref 70–99)

## 2012-08-18 LAB — HEPATIC FUNCTION PANEL
ALT: 29 U/L (ref 0–35)
Albumin: 3.7 g/dL (ref 3.5–5.2)
Alkaline Phosphatase: 78 U/L (ref 39–117)
Bilirubin, Direct: 0.1 mg/dL (ref 0.0–0.3)
Total Protein: 7.4 g/dL (ref 6.0–8.3)

## 2012-08-18 LAB — MICROALBUMIN / CREATININE URINE RATIO: Microalb Creat Ratio: 1.7 mg/g (ref 0.0–30.0)

## 2012-08-18 LAB — LIPID PANEL
Total CHOL/HDL Ratio: 5
Triglycerides: 163 mg/dL — ABNORMAL HIGH (ref 0.0–149.0)

## 2012-08-18 LAB — TSH: TSH: 3.38 u[IU]/mL (ref 0.35–5.50)

## 2012-08-18 NOTE — Progress Notes (Signed)
  Subjective:    Patient ID: Marisa Davenport, female    DOB: 07/07/1941, 71 y.o.   MRN: [id number]  HPI 71 yr old female for a cpx. She feels well although she still deals with frequent urinations and oocasional incontinence.    Review of Systems  Constitutional: Negative.   HENT: Negative.   Eyes: Negative.   Respiratory: Negative.   Cardiovascular: Negative.   Gastrointestinal: Negative.   Genitourinary: Negative for dysuria, urgency, frequency, hematuria, flank pain, decreased urine volume, enuresis, difficulty urinating, pelvic pain and dyspareunia.  Musculoskeletal: Negative.   Skin: Negative.   Neurological: Negative.   Hematological: Negative.   Psychiatric/Behavioral: Negative.        Objective:   Physical Exam  Constitutional: She is oriented to person, place, and time. She appears well-developed and well-nourished. No distress.  HENT:  Head: Normocephalic and atraumatic.  Right Ear: External ear normal.  Left Ear: External ear normal.  Nose: Nose normal.  Mouth/Throat: Oropharynx is clear and moist. No oropharyngeal exudate.  Eyes: Conjunctivae normal and EOM are normal. Pupils are equal, round, and reactive to light. No scleral icterus.  Neck: Normal range of motion. Neck supple. No JVD present. No thyromegaly present.  Cardiovascular: Normal rate, regular rhythm, normal heart sounds and intact distal pulses.  Exam reveals no gallop and no friction rub.   No murmur heard. Pulmonary/Chest: Effort normal and breath sounds normal. No respiratory distress. She has no wheezes. She has no rales. She exhibits no tenderness.  Abdominal: Soft. Bowel sounds are normal. She exhibits no distension and no mass. There is no tenderness. There is no rebound and no guarding.  Musculoskeletal: Normal range of motion. She exhibits no edema and no tenderness.  Lymphadenopathy:    She has no cervical adenopathy.  Neurological: She is alert and oriented to person, place, and time. She has  normal reflexes. No cranial nerve deficit. She exhibits normal muscle tone. Coordination normal.  Skin: Skin is warm and dry. No rash noted. No erythema.  Psychiatric: She has a normal mood and affect. Her behavior is normal. Judgment and thought content normal.          Assessment & Plan:  Well exam. Get fasting labs.

## 2012-08-24 ENCOUNTER — Telehealth: Payer: Self-pay | Admitting: Breast Surgery

## 2012-08-24 NOTE — Telephone Encounter (Signed)
Writer is not able to give bx results -forwarding to provider

## 2012-08-24 NOTE — Telephone Encounter (Signed)
Writer phoned the patients daughter related to biopsy- the information is not in e record - Clinical research associate called Dr Basilia Jumbo- who stated that special stains are being done- and should be complete tomorrow-   Writer will remind Dr Basilia Jumbo to call the patient tomorrow-

## 2012-08-25 ENCOUNTER — Encounter: Payer: Self-pay | Admitting: Family Medicine

## 2012-08-25 ENCOUNTER — Telehealth: Payer: Self-pay | Admitting: Breast Surgery

## 2012-08-25 MED ORDER — LISINOPRIL 10 MG PO TABS: 10.0000 mg | ORAL_TABLET | Freq: Every day | ORAL | Status: DC

## 2012-08-25 NOTE — Addendum Note (Signed)
Addended by: Aniceto Boss A on: 08/25/2012 03:16 PM   Modules accepted: Orders

## 2012-08-25 NOTE — Progress Notes (Signed)
Quick Note:  I spoke with pt, sent script e-scribe and put a copy of results in mail. Pt wants to know what could be the reason for the protein in the urine? She said that she doesn't have diabetes. ______

## 2012-08-25 NOTE — Telephone Encounter (Signed)
Left message on daughter's cell that Dr. Basilia Jumbo will call her today at 2:00 pm.

## 2012-08-25 NOTE — Telephone Encounter (Signed)
Left message on daughter's cell phone that you will call at 2:00 pm.

## 2012-08-28 LAB — SURGICAL PATHOLOGY

## 2012-08-28 NOTE — Progress Notes (Signed)
Quick Note:  I spoke with and she does understand now the reason for the new script. ______

## 2012-08-29 ENCOUNTER — Ambulatory Visit: Payer: Self-pay | Admitting: Cardiology

## 2012-08-29 ENCOUNTER — Ambulatory Visit
Admit: 2012-08-29 | Discharge: 2012-08-29 | Disposition: A | Discharge status: Home / Self Care | Payer: Self-pay | Source: Ambulatory Visit | Attending: Breast Surgery | Admitting: Breast Surgery

## 2012-08-29 ENCOUNTER — Ambulatory Visit
Admit: 2012-08-29 | Discharge: 2012-08-29 | Disposition: A | Discharge status: Home / Self Care | Payer: Self-pay | Source: Ambulatory Visit | Attending: Cardiology | Admitting: Cardiology

## 2012-08-29 ENCOUNTER — Encounter: Payer: Self-pay | Admitting: Breast Surgery

## 2012-08-29 ENCOUNTER — Ambulatory Visit: Payer: Self-pay | Admitting: Breast Surgery

## 2012-08-29 ENCOUNTER — Encounter: Payer: Self-pay | Admitting: Cardiology

## 2012-08-29 ENCOUNTER — Other Ambulatory Visit: Payer: Self-pay | Admitting: Gastroenterology

## 2012-08-29 VITALS — BP 164/100 | HR 60 | Temp 97.9°F | Ht 65.98 in | Wt 199.0 lb

## 2012-08-29 VITALS — BP 164/100 | HR 60 | Ht 66.0 in | Wt 199.0 lb

## 2012-08-29 DIAGNOSIS — R002 Palpitations: Secondary | ICD-10-CM

## 2012-08-29 DIAGNOSIS — R0600 Dyspnea, unspecified: Secondary | ICD-10-CM

## 2012-08-29 DIAGNOSIS — I471 Supraventricular tachycardia: Secondary | ICD-10-CM

## 2012-08-29 DIAGNOSIS — D0590 Unspecified type of carcinoma in situ of unspecified breast: Secondary | ICD-10-CM | POA: Insufficient documentation

## 2012-08-29 DIAGNOSIS — I10 Essential (primary) hypertension: Secondary | ICD-10-CM

## 2012-08-29 LAB — COMPREHENSIVE METABOLIC PANEL
ALT: 42 U/L — ABNORMAL HIGH (ref 0–35)
AST: 22 U/L (ref 0–35)
Albumin: 4.5 g/dL (ref 3.5–5.2)
Alk Phos: 77 U/L (ref 35–105)
Anion Gap: 11 (ref 7–16)
Bilirubin,Total: 0.7 mg/dL (ref 0.0–1.2)
CO2: 27 mmol/L (ref 20–28)
Calcium: 9.2 mg/dL (ref 8.6–10.2)
Chloride: 104 mmol/L (ref 96–108)
Creatinine: 0.95 mg/dL (ref 0.51–0.95)
GFR,Black: 71 *
GFR,Caucasian: 61 *
Glucose: 85 mg/dL (ref 60–99)
Lab: 16 mg/dL (ref 6–20)
Potassium: 4.7 mmol/L (ref 3.3–5.1)
Sodium: 142 mmol/L (ref 133–145)
Total Protein: 7 g/dL (ref 6.3–7.7)

## 2012-08-29 LAB — CBC AND DIFFERENTIAL
Baso # K/uL: 0 10*3/uL (ref 0.0–0.1)
Basophil %: 0.2 % (ref 0.1–1.2)
Eos # K/uL: 0.1 10*3/uL (ref 0.0–0.4)
Eosinophil %: 1.4 % (ref 0.7–5.8)
Hematocrit: 45 % (ref 34–45)
Hemoglobin: 14.5 g/dL (ref 11.2–15.7)
Lymph # K/uL: 2.4 10*3/uL (ref 1.2–3.7)
Lymphocyte %: 26.1 % (ref 19.3–51.7)
MCV: 84 fL (ref 79–95)
Mono # K/uL: 0.6 10*3/uL (ref 0.2–0.9)
Monocyte %: 6.1 % (ref 4.7–12.5)
Neut # K/uL: 6.1 10*3/uL (ref 1.6–6.1)
Platelets: 203 10*3/uL (ref 160–370)
RBC: 5.4 MIL/uL — ABNORMAL HIGH (ref 3.9–5.2)
RDW: 14.5 % — ABNORMAL HIGH (ref 11.7–14.4)
Seg Neut %: 66.2 % (ref 34.0–71.1)
WBC: 9.2 10*3/uL (ref 4.0–10.0)

## 2012-08-29 NOTE — Progress Notes (Signed)
Pt saw her cardiologist this AM (Dr. Andree Coss at [facility]). Pt has been having increased SOB, sleeps on 3 pillows at night, and sometimes wakes up during the night and has to sit up to catch her breath. Dr.Nelson has ordered a nuclear stress test for this Thurs Nov 7.   Pre-op teaching completed with patient and daughter today - pt to have surgery at [facility] with Dr.Farkas on Wed Nov 13 if Dr.Nelson clears her for surgery. Written information was reviewed and given to patient including instructions for  the day before and day of surgery. Pt instructed not to take any aspirin, ibuprofen, aleve, omega 3 / fish oil, or Vitamin E for 1 week prior to surgery. Pt instructed to remain NPO after midnight the night before surgery, and has been instructed to take her metoprolol and protonix with a very small sip of water the AM of surgery.  Pt has [facility] phone # to call the afternoon before surgery for her arrival time. The patient will have her pre-op labs and CXR, done today. She is scheduled for a nuclear stress test on Nov 7, and will have her cardiology clearance for surgery by Dr.Nelson.  Patient has our phone number to call if she has any further questions or concerns. Patient and daughter also met with social worker Jay Schlichter today regarding staying at [facility] the night before surgery since they live in [place], [place].  Patient and daughter demonstrated good understanding of all instructions.

## 2012-08-29 NOTE — Progress Notes (Signed)
Subjective:       Stacie Kim presents to the [facility] 1 week following right site select biopsy for ADH. 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  Pathological Staging: pTis (DCIS) cNx cMx      Assessment:       Stacie Kim is an otherwise healthy woman with a newly diagnosed DCIS, right breast, ER/PR positive, Clinical: Stage 0 (Tis, N0, cM0)  Doing well postoperatively.       Plan:     We reviewed her work-up to date and the results. We briefly reviewed the epidemiology, diagnosis, prognosis and treatment options available for patients with DCIS. Ductal carcinoma in situ represents a group of proliferative disorders with a diverse malignant potential and the most rapidly growing subgroup of breast cancers with over 62,000 new cases diagnosed annually. We pointed out that DCIS is both a direct precursor of invasive breast cancer as well as a major risk factor for the development of a new primary invasive breast cancer as well.   Most of our discussion was focused on her surgical options which include: breast conserving therapy (partial mastectomy followed by radiation therapy to the remaining breast tissue) vs mastectomy with or without reconstruction.  She understands that, in the absence of specific contraindications, these 2 surgical approaches are felt to be equivalent in the treatment of early stage breast cancer.  The risks and benefits of both approaches were discussed in detail. Risks common to both surgical approaches include the risk of bleeding, infection, fluid collection, recurrence. We discussed the importance of adequate surgical margins for successful breast conserving therapy and the potential need for further surgery to reexcise close or positive margins.   We discussed the need for needle localization as this is a nonpalpable lesion.    We talked about the difference between immediate and delayed reconstruction. We discussed in general terms options for reconstruction (implant based vs tissue transfer) after mastectomy and the use of a prosthesis if no reconstruction is performed. We discussed the expectations regarding cosmetic results following breast conserving therapy and reconstructive options available postlumpectomy. She was offered referral to a Engineer, petroleum.   We reviewed the role of radiation and how it is an essential component of breast conserving approaches. We reviewed the indications for postmastectomy radiation and pointed out that although it would be very rare for a woman with DCIS who undergoes mastectomy to require radiation, choosing mastectomy does not ensure the avoidance of radiation. We outlined the time requirements for standard whole breast radiation and briefly explained that in highly selected individuals, shorter courses of radiation may be possible. These decisions require final pathology results and she will be referred to a radiation oncologist once the pathology is available.   We also discussed with her the possibility of finding an invasive  cancer at the time of surgery. For this reason, we routinely perform sentinel node biopsy in women with DCIS who elect to undergo mastectomy. If she chooses breast conservation and is found to have an invasive component, a sentinel node biopsy would be performed later.    We noted that some DCIS are hormonally responsive as defined by  expression of receptors for estrogen and/or progesterone. In these cases, anti-estrogen therapy is also an option and if she is a candidate she will be referred to a medical oncologist once these results are known.   We reviewed what is known about the impact of lifestyle issues on breast cancer risk.  It is clear that a healthy lifestyle, with maintenance of an ideal body weight, diet high in fiber and low in fat, regular exercise, and avoidance of alcohol intake are associated with lower risk of the development of breast cancer. Further, based on rather compelling epidemiologic data showing an association between low vitamin D levels and breast cancer risk and breast cancer mortality, we recommend that levels be maintained at the mid to higher end of the normal range (40-80).    Finally we reviewed the recommendations for ongoing surveillance once she completes her treatment. We recommend a history and physical every 3 months for the first 2-3 years after primary therapy; every 6 months for years 4 and 5; then annually . We also recommend post-treatment mammogram of the treated breast upon resolution of symptoms from radiation therapy and then every 6 months for 3 years in patients undergoing breast conserving therapy. The contralateral breast should be imaged annually. There is no good data to support routine annual screening MRIs in this population and these decisions are made on an individualized basis.   All of her questions were answered and at the end of our discussion she expressed her desire to proceed with lumpectomy.    We spent approximately 75 minutes with the patient, 90 percent of which was spent in counseling and treatment planning.

## 2012-08-29 NOTE — Progress Notes (Signed)
Dear Dr. Willette Pa:     [name], a 71 y.o. Female presents for follow up of  palpitations and Paroxysmal SVT.    HPI: Stacie Kim is generally doing reasonably well.  She unfortunately was recently diagnosed with breast cancer, and will require surgery.  She will be meeting with her surgeon later today to discuss the recommended surgical procedure.  She reports weight gain over the summer, she feels related to stress.  She is generally feeling less stressed out now, as her divorce proceedings are finally over.  She continues to have palpitations in the setting of anxiety, but not in other situations.  She will occasionally use extra metoprolol on an as-needed basis, but has continued twice a day dosing.  She does complain of shortness of breath over the past several months, in the setting of this weight gain.  She also describes needing 3 pillows at night for comfortable breathing; she has used 2 pillows chronically.  She denies lower extremity edema or substernal chest pain accompanying this dyspnea.  She would generally be able to align 3-4 flights of stairs without stopping, though she does not do this frequently unless the elevator at her apartment complex is out of order.    Past Medical History   Diagnosis Date   . Hypertension 01/01/2011   . Palpitations (Symptom) 01/01/2011     Past Surgical History   Procedure Laterality Date   . Cholecystectomy       Cholecystectomy Conversion Data    . Hysterectomy       Hysterectomy Conversion Data        ROS: 10 point review of systems otherwise negative except as per HPI.    Allergies: Codeine and No known latex allergy    Medications:  Current Outpatient Prescriptions   Medication Sig Dispense Refill   . metoprolol (LOPRESSOR) 25 MG tablet Take 25 mg by mouth 2 times daily       . pantoprazole (PROTONIX) 20 MG EC tablet Take 20 mg by mouth daily   Swallow whole. Do  not crush, break, or chew.         No current facility-administered medications for this visit.        Vital Signs:  Blood pressure 164/100, pulse 60, height 1.676 m (5\' 6" ), weight 90.266 kg (199 lb).  Body mass index is 32.13 kg/(m^2).     GENERAL: Well appearing pleasant white female. NAD. Color good.  NECK: Trachea midline, no stridor. Carotid upstrokes normal with no bruits. JVP 6-7 cm.  RESPIRATORY: Normal bilateral breath sounds without wheezes, rales or rhonchi. No dullness to percussion.  COR: Normal precordium and PMI. Normal S1 and S2, regular. No murmur, rub, or gallops.  ABD: soft, nontender, nondistended, normoactive bowel sounds, no organomegaly.  VASCULAR: 2+ pulses at radial, dorsalis pedis, and posterior tibial pulses bilaterally. Capillary refill within 2 seconds. No signs of venous insufficiency.   EXTREMITIES: No cyanosis, clubbing, or edema.    Cholesterol Profile:  No results found for this basename: CHOL, HDL, LDLC, TRIG, CHHDC      No results found for this basename: na, K, Cl, co2, UN, CR, GLU, GFR     12 Lead ECG: Normal sinus rhythm 59 beats per minute.    Reason for ECG:  dyspnea.    Most recent cardiac testing: Holter monitor 08/09/11    Impression    1. Sinus rhythm with a mean heart rate of 65 bpm, a maximum heart  rate of 106 bpm, and  a minimum heart rate of 46 bpm.   Approximately 8 hours of sinus bradycardia.    2. There were frequent multifocal premature ventricular  contractions and rare couplets. There were also rare periods of  ventricular trigeminy noted.    3. 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 The  longest and fastest run was 13 beats at 113 bpm.    4. No pauses.    5. Diary was returned without any symptoms logged.      Assessment: Stacie Kim is a 71 y.o. Female with history of hypertension, palpitations with paroxysmal SVT and recently diagnosed breast  cancer.  She presents with exertional dyspnea symptoms, in the setting of weight gain and hypertension despite use of metoprolol.  Her ECG and physical exam are benign.    Plan:   1.  Dyspnea on exertion- given her new symptoms, including exertional dyspnea with mild orthopnea, I recommended proceeding with repeat stress testing prior to breast cancer surgery.  Given her poor exercise capacity previously, I would prefer pharmacologic modality to achieve the highest possible sensitivity.  Given her previous documented SVT, I would prefer not to give her a dobutamine stress echocardiogram.  Thus, I recommended a nuclear stress test, which will be arranged for her at her earliest convenience, likely later this week.  She knows to be n.p.o. after midnight and hold her metoprolol the morning of the test, and she will receive further instructions from our office.  I tried to reassure her that her ECG and exam are quite normal, but nonetheless this evaluation is appropriate especially in light of her upcoming surgery.     2.  Hypertension- I suspect she will require titration of medication to achieve better blood pressure control.  We reviewed that her hypertension, as well as weight gain, certainly could be the root of her dyspnea symptoms.  She will continue metoprolol for now, and we may wish to add another medication such as lisinopril or a thiazide diuretic.    3.  Paroxysmal SVT-  She will continue twice a day metoprolol, which seems to be controlling her symptoms nicely.  Certainly some aspect of her palpitations may truly be anxiety related.  We reviewed the Holter tracings, which showed a few short episodes of SVT, at a relatively slow rate (less than 120 beats per minute).  We reviewed that this is not expected to be dangerous or cause significant perioperative issues.      I will see her in followup at the time of the nuclear stress test.    Thank you for the opportunity of sharing in the care of this patient.   We look forward to working with you in the future.    Honor Junes, MD  [facility]

## 2012-08-30 NOTE — Progress Notes (Signed)
Contacts:  Tristynn and her daughter, Angelique Blonder  Intervention: I met with Imanni and her daughter, Angelique Blonder, in part to answer questions about staying at [facility] the night before her surgery which is scheduled for 09/08/12. They are mainly worried about the weather turning bad and needing to be here overnight as a precaution.  Draxie is a newly divorced woman who indicated she had been through a rough time with her husband.  Her daughter, Angelique Blonder, is the oldest of her 5 children and her primary support person. I described the process of utilizing [facility] and told them I would send in a referral just in case they needed it.  I also related the other types of services which might be available to Mit[place] to assist her such as our pharmacy fund and our backups to helping people cope with the financial stress of their treatment process.   Plan: I will continue to be available as needed for psychosocial and logistical support.

## 2012-08-31 ENCOUNTER — Ambulatory Visit: Payer: Self-pay

## 2012-08-31 ENCOUNTER — Ambulatory Visit: Payer: Self-pay | Admitting: Cardiology

## 2012-08-31 ENCOUNTER — Ambulatory Visit
Admit: 2012-08-31 | Discharge: 2012-08-31 | Disposition: A | Discharge status: Home / Self Care | Payer: Self-pay | Source: Ambulatory Visit | Attending: Cardiology | Admitting: Cardiology

## 2012-08-31 ENCOUNTER — Encounter: Payer: Self-pay | Admitting: Cardiology

## 2012-08-31 VITALS — BP 150/80 | HR 76 | Ht 66.0 in | Wt 196.0 lb

## 2012-08-31 DIAGNOSIS — R002 Palpitations: Secondary | ICD-10-CM

## 2012-08-31 DIAGNOSIS — R0600 Dyspnea, unspecified: Secondary | ICD-10-CM

## 2012-08-31 DIAGNOSIS — I10 Essential (primary) hypertension: Secondary | ICD-10-CM

## 2012-08-31 DIAGNOSIS — I471 Supraventricular tachycardia: Secondary | ICD-10-CM

## 2012-08-31 DIAGNOSIS — Z01810 Encounter for preprocedural cardiovascular examination: Secondary | ICD-10-CM

## 2012-08-31 LAB — EKG 12-LEAD
P: 46 degrees
QRS: 16 degrees
Rate: 59 {beats}/min
Severity: NORMAL
Severity: NORMAL
T: 25 degrees

## 2012-08-31 MED ORDER — LISINOPRIL 5 MG PO TABS *I*
5.0000 mg | ORAL_TABLET | Freq: Every day | ORAL | Status: DC
Start: 2012-08-31 — End: 2013-03-13

## 2012-08-31 NOTE — Progress Notes (Signed)
Dear Dr. Willette Pa:     [name], a 71 y.o. Female presents for follow up of  dyspnea.    HPI: [name] that presented today for a nuclear stress test, following her recent presentation with exertional dyspnea in the setting of hypertension and recent weight gain.  She will be requiring mastectomy, as well as likely radiation therapy, for breast cancer.  She denies any new symptoms since our last visit.  She has shortness of breath on the treadmill, but no chest pain.    Past Medical History   Diagnosis Date   . Hypertension 01/01/2011   . Palpitations (Symptom) 01/01/2011     Past Surgical History   Procedure Laterality Date   . Cholecystectomy       Cholecystectomy Conversion Data    . Hysterectomy       Hysterectomy Conversion Data        Allergies: Codeine and No known latex allergy    Medications:  Patient's Medications   New Prescriptions    LISINOPRIL (PRINIVIL,ZESTRIL) 5 MG TABLET    Take 1 tablet (5 mg total) by mouth daily   Previous Medications    METOPROLOL (LOPRESSOR) 25 MG TABLET    Take 25 mg by mouth 2 times daily    MULTIPLE VITAMINS-MINERALS (MULTIVITAMIN PO)    Take by mouth daily    PANTOPRAZOLE (PROTONIX) 20 MG EC TABLET    Take 20 mg by mouth daily   Swallow whole. Do not crush, break, or chew.   Modified Medications    No medications on file   Discontinued Medications    No medications on file        Vital Signs:  Blood pressure 150/80, pulse 76, height 1.676 m (5\' 6" ), weight 88.905 kg (196 lb).  Body mass index is 31.65 kg/(m^2).     GENERAL: Well appearing pleasant middle-aged white female. NAD. Color good.  NECK: Trachea midline, no stridor. Carotid upstrokes normal with no bruits. JVP normal.  RESPIRATORY: Normal bilateral breath sounds without wheezes, rales or rhonchi. No dullness to percussion.  COR: Normal precordium and PMI. Normal S1 and S2, regular. No murmur, rub, or  gallops.  ABD: soft, nontender, nondistended, normoactive bowel sounds, no organomegaly.  EXTREMITIES: No cyanosis, clubbing, or edema.    Cholesterol Profile:  No results found for this basename: CHOL, HDL, LDLC, TRIG, [facility]      Lab Results   Component Value Date    NA 142 08/29/2012    K 4.7 08/29/2012    CL 104 08/29/2012    CO2 27 08/29/2012    UN 16 08/29/2012    GLU 85 08/29/2012         Most recent cardiac testing: Nuclear stress test 08/31/12    CONCLUSION:   1. Mildly reduced aerobic capacity with no exercise induced  chest pain.  2. Negative stress ECG for ischemia at >100% of max predicted  heart rate. Mildly symptomatic supraventricular tachycardia with  exercise, with spontaneous termination in recovery.  3. Normal left ventricular size and systolic function with no  regional wall motion abnormalities at rest or with stress.  4. Normal rest and stress myocardial perfusion with no  reversible defects.  5. Negative nuclear SPECT myocardial perfusion imaging study for  inducible myocardial ischemia at the cardiac work load achieved.      Assessment: Stacie Kim is a 71 y.o. Female with a history of palpitations, paroxysmal SVT, hypertension, exertional dyspnea and recently diagnosed breast cancer.  She will be  requiring mastectomy surgery.  As part of this evaluation she underwent a nuclear stress test today, which was negative for ischemia.  She did have paroxysmal SVT during exercise, which was well tolerated symptomatically and hemodynamically.    Plan:   1.  Preoperative cardiac risk evaluation- She is of acceptable low (less than 1%) risk of perioperative acute cardiac complications in the setting of mastectomy surgery.  She certainly could have recurrent paroxysmal SVT in the perioperative period.  If this is the case, I would uptitrate her oral beta blockers, and/or potentially use IV beta blockers or calcium channel blockers for acute issues.    2.  Paroxysmal SVT- By review her ECGs, her SVT is of a long  RP morphology, which could suggest a form of atrial tachycardia, though AV or AV nodal reentry cannot completely be excluded.  Regardless, she will continue metoprolol, which has also greatly helped with her symptomatic PVCs.  If recurrent SVT becomes more of an issue, she would be a candidate for electrophysiologic study with possible ablation.  This is not needed prior to surgery.    3.  Hypertension- I recommended the addition of lisinopril 5 mg per day, in addition to twice a day metoprolol.  I encouraged her to increase her exercise, with the goal of weight reduction.  We reviewed that weight reduction may obviate the need for lisinopril at some point.  We also reviewed that her lisinopril can be further titrated as needed to goal blood pressure of less than 140/90, though strict blood pressure control is not an absolute requirement prior to surgery.    I will see her in followup in 6 months, sooner if needed.     Thank you for the opportunity of sharing in the care of this patient.  We look forward to working with you in the future.    Honor Junes, MD  [facility]

## 2012-08-31 NOTE — Patient Instructions (Signed)
Add lisinopril 5 mg once a day for blood pressure control,    Continue metoprolol as you are taking it

## 2012-09-04 LAB — VITAMIN D
25-OH VIT D2: <4 ng/mL
25-OH VIT D3: 26 ng/mL
25-OH Vit Total: 26 ng/mL — ABNORMAL LOW (ref 30–80)

## 2012-09-06 ENCOUNTER — Ambulatory Visit
Admit: 2012-09-06 | Disposition: A | Discharge status: Home / Self Care | Payer: Self-pay | Source: Ambulatory Visit | Attending: Breast Surgery | Admitting: Breast Surgery

## 2012-09-06 DIAGNOSIS — Z9010 Acquired absence of unspecified breast and nipple: Secondary | ICD-10-CM | POA: Insufficient documentation

## 2012-09-06 LAB — TYPE AND SCREEN
ABO RH Blood Type: A POS
Antibody Screen: NEGATIVE

## 2012-09-06 MED ORDER — PROMETHAZINE HCL 25 MG/ML IJ SOLN *I*
6.2500 mg | Freq: Once | INTRAMUSCULAR | Status: AC | PRN
Start: 2012-09-06 — End: 2012-09-06
  Administered 2012-09-06: 6.25 mg via INTRAVENOUS
  Filled 2012-09-06: qty 1

## 2012-09-06 MED ORDER — LIDOCAINE HCL 1 % IJ SOLN
0.1000 mL | INTRAMUSCULAR | Status: DC | PRN
Start: 2012-09-06 — End: 2012-09-06
  Administered 2012-09-06: 0.1 mL via SUBCUTANEOUS

## 2012-09-06 MED ORDER — MEPERIDINE HCL 25 MG/ML IJ SOLN *I*
12.5000 mg | INTRAMUSCULAR | Status: DC | PRN
Start: 2012-09-06 — End: 2012-09-06

## 2012-09-06 MED ORDER — HYDROCODONE-ACETAMINOPHEN 5-325 MG PO TABS *I*
2.0000 | ORAL_TABLET | Freq: Four times a day (QID) | ORAL | Status: DC | PRN
Start: 2012-09-06 — End: 2012-09-07

## 2012-09-06 MED ORDER — LACTATED RINGERS IV SOLN *I*
100.0000 mL/h | INTRAVENOUS | Status: DC
Start: 2012-09-06 — End: 2012-09-07

## 2012-09-06 MED ORDER — CEFAZOLIN SODIUM-2000 MG DEXTROSE 3% DUPLEX IV *I*
INTRAVENOUS | Status: AC
Start: 2012-09-06 — End: 2012-09-06
  Filled 2012-09-06: qty 1

## 2012-09-06 MED ORDER — SODIUM CHLORIDE 0.9 % IV SOLN WRAPPED *I*
20.0000 mL/h | Status: DC
Start: 2012-09-06 — End: 2012-09-06

## 2012-09-06 MED ORDER — METHYLENE BLUE 1 % IV SOLN *WRAPPED*
INTRAVENOUS | Status: AC
Start: 2012-09-06 — End: 2012-09-06
  Filled 2012-09-06: qty 1

## 2012-09-06 MED ORDER — FENTANYL CITRATE 50 MCG/ML IJ SOLN *WRAPPED*
25.0000 ug | INTRAMUSCULAR | Status: DC | PRN
Start: 2012-09-06 — End: 2012-09-06
  Administered 2012-09-06: 25 ug via INTRAVENOUS
  Filled 2012-09-06: qty 2

## 2012-09-06 MED ORDER — HALOPERIDOL LACTATE 5 MG/ML IJ SOLN *I*
0.5000 mg | Freq: Once | INTRAMUSCULAR | Status: DC | PRN
Start: 2012-09-06 — End: 2012-09-06

## 2012-09-06 MED ORDER — DIPHENHYDRAMINE HCL 50 MG/ML IJ SOLN *I*
25.0000 mg | INTRAMUSCULAR | Status: DC | PRN
Start: 2012-09-06 — End: 2012-09-06

## 2012-09-06 MED ORDER — HYDROCODONE-ACETAMINOPHEN 5-325 MG PO TABS *I*
1.0000 | ORAL_TABLET | Freq: Four times a day (QID) | ORAL | Status: DC | PRN
Start: 2012-09-06 — End: 2012-09-28

## 2012-09-06 MED ORDER — HYDROCODONE-ACETAMINOPHEN 5-325 MG PO TABS *I*
1.0000 | ORAL_TABLET | Freq: Four times a day (QID) | ORAL | Status: DC | PRN
Start: 2012-09-06 — End: 2012-09-07
  Administered 2012-09-06: 1 via ORAL
  Filled 2012-09-06: qty 1

## 2012-09-06 MED ORDER — LACTATED RINGERS IV SOLN *I*
20.0000 mL/h | INTRAVENOUS | Status: DC
Start: 2012-09-06 — End: 2012-09-06
  Administered 2012-09-06: 20 mL/h via INTRAVENOUS

## 2012-09-06 NOTE — Op Note (Signed)
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    Indications for Procedure: Stacie Kim is a 71 y.o. 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    Description of Procedure: After informed consent was obtained, the surgical site marked and the Day of Surgery Update completed, the patient was taken to the operating room and positioned in a supine position on the operating room table. General LMA anesthesia was induced without difficulty. SCDs were placed for DVT prophylaxis. 2gm of Ancef was given within 60 minutes prior to incision for surgical prophylaxis. Lower body Bair Hugger was applied to help maintain normothermia.     The right neck, shoulder, axilla, breast, and chest wall were then prepped and draped in the usual sterile fashion. Surgical time-out was completed. Preemptive analgesia was achieved using 1% lidocaine and 0.5% Marcaine 50:50 mixture by local infiltration.  After adequate anesthesia was assured the prior skin incision was reopened with a 15 blade scalpel.  Subcutaneous tissues were sharply divided down to and into the biopsy cavity using Bovie cautery. Given the size of the biopsy cavity the entire area around the site select cavity was excised enbloc. The tissue was labeled right  breast re-excision and was laid out on Telfa and oriented. Grossly the lesion appeared to be too close to the Posterior margin. We therefore returned to the operating room and excised more tissue from the Posterior walls of the cavity. The new margins were laid out on Telfa and oriented. The specimen was labeled new posterior margin. Both specimen were hand carried to pathology where They were oriented for the pathologist. Margins were inked and the specimen was breadloafed and examined by inspection and palpation with the attending pathologist. Grossly all margins appeared adequate.     We returned to the operating room. Hemostasis was achieved with a combination of direct pressure and Bovie cautery. The breast cavity was marked with hemoclips. After hemostasis was assured, the wound was irrigated with sterile saline solution. Surgical sweep was performed. Again hemostasis was confirmed, and the breast incision was then closed in 2 layers.  The superficial subcutaneous tissues were reapproximated with interrupted deep dermal inverted 3-0 Vicryl sutures. The skin was closed with a running 4-0 Moncryl subcuticular closure. Dermabond tissue glue was applied to both wounds.      Anesthesia was reversed and the patient was extubated without difficulty. She was transferred to the PACU in good condition having tolerated the procedure well. There were no complications. Estimated blood loss was less than 50 cc. Final sponge and needle counts x 2 were correct at the end of the case.        Estimated Blood Loss:  50cc  Packing:  No  Drains:  No  Fluid Totals: See OR record   Specimens  to Pathology:  yes  Patient Condition:  good    Signed:  Zenda Alpers, MD   on 09/06/2012 at 1:03 PM

## 2012-09-06 NOTE — Discharge Instructions (Addendum)
[facility]  Telephone: [phone]    Right Partial Mastectomy Postoperative Instructions    Wound Care:    The wound is sealed with tissue glue. Do not pick at the glue. It will begin to peel off on its own after1-2 weeks, at which point you can pull it off.     You may shower beginning the day after your surgery. Pat your incision(s) dry afterwards. Do not soak your incision(s) in a tub, hot tub,  or pool for 7 days after your surgery.      The stitches are all under the skin and will dissolve on their own. They do not need to be removed.     You may benefit from wearing a sports bra or snuggly fitting bra around the clock for the first 3-5 days. This will compress the breast and may keep you much more comfortable.     Avoid jogging, aerobics or other strenuous activity for 2 weeks to allow the breast to heal. Walking or exercising on a stationary bicycle is permitted. (In other words, do not take part in activities that shake up your breast too much. It could cause bleeding and pain). If you had a sentinel node biopsy, do not do any heavy (anything heavier than a gallon of milk) lifting or vigorous upper body exercise for 2 weeks to allow the incision in the armpit to heal.    Keep the ace bandage on until tomorrow.  You may take this off before you shower tomorrow    Postoperative Pain Management:   You should not have a lot of pain. The local anesthetic or block you received at the time of surgery will wear off after 8-12 hours. The medications given in the recovery room should keep you comfortable overnight.     You will be given a prescription for pain medications when you leave the hospital. Use these prescription medications if you are having serious pain. For mild pain, use Tylenol or Ibuprofen (Advil, Aleve, Motrin, etc).    When to call:   Call if you have bleeding, severe pain, swelling, redness around the wound, or fever.     Call if you have any questions.    Call to make an  appointment to see me in the office in one week for a check of your wound and to discuss the results of your biopsy.

## 2012-09-06 NOTE — OR PreOp (Signed)
Day of Surgery/Procedure Update:  History  History reviewed and no change    Physical  Physical exam updated and no change

## 2012-09-06 NOTE — INTERIM OP NOTE (Signed)
Interim Op Note    Date of Surgery: 09/06/2012  Surgeon: Zenda Alpers, MD  First Assistant: Jefm Bryant, PA    Pre-Op Diagnosis: DCIS right breast ER/PR Positive, right breast mass    Anesthesia Type: General    Post-Op Diagnosis:    Primary: Same    Additional Findings (Including unexpected complications): none    Procedure(s) Performed (including CPT 4 Code if available)   Right partial mastectomy    Estimated Blood Loss: 15cc   Packing: No  Drains: No  Fluid Totals: Intakes: 1000cc IVF Outputs: None  Specimens to Pathology: yes  Patient Condition: Stable in PACU

## 2012-09-06 NOTE — Anesthesia Post-procedure Eval (Signed)
Anesthesia Post-op Note    Patient: Stacie Kim    Procedure(s) Performed: Right Partial Mastectomy    Anesthesia type: General    Patient location: PACU    Mental Status: Recovered to baseline    Patient able to participate in this evaluation: yes  Last Vitals: BP: 146/68 mmHg (09/06/12 1133)  Heart Rate: 62 (09/06/12 1133)  Temp: 36.8 C (98.2 F) (09/06/12 1133)  Resp: 16 (09/06/12 1133)  Height: 167.6 cm (5\' 6" ) (09/06/12 1133)  Weight: 89.359 kg (197 lb) (09/06/12 1133)  BMI (Calculated): 31.9 (09/06/12 1133)  SpO2: 97 % (09/06/12 1133)      Post-op vital signs noted above are within patient's normal range  Post-op vitals signs: stable  Respiratory function: baseline    Airway patent: Yes    Cardiovascular and hydration status stable: Yes    Post-Op pain: Adequate analgesia    Post-Op nausea and vomiting: controlled    Post-Op assessment: no apparent anesthetic complications    Complications: none    Attending Attestation: All indicated post anesthesia care provided    Author: Crecencio Mc, MD  as of: 09/06/2012  at: 2:52 PM

## 2012-09-06 NOTE — Anesthesia Pre-procedure Eval (Signed)
Anesthesia Pre-operative Evaluation for Stacie Kim      Health History  Past Medical History   Diagnosis Date   . Hypertension 01/01/2011   . Palpitations (Symptom) 01/01/2011     Past Surgical History   Procedure Laterality Date   . Cholecystectomy       Cholecystectomy Conversion Data    . Hysterectomy       Hysterectomy Conversion Data      Social History  History   Substance Use Topics   . Smoking status: Never Smoker    . Smokeless tobacco: Never Used   . Alcohol Use: No      History   Drug Use No     ______________________________________________________________________  Allergies:   Allergies   Allergen Reactions   . Codeine        Headache;    . No Known Latex Allergy      Prior to Admission Medications    Last Medication Reconciliation Action:  Up-To-Date Daneen Schick, RN 08/31/2012  8:26 AM              Last Dose Start Date End Date Provider     Multiple Vitamins-Minerals (MULTIVITAMIN PO) Past Week at Unknown  --  --  [provider]     lisinopril (PRINIVIL,ZESTRIL) 5 MG tablet 09/06/2012 at 700  08/31/12  --  Honor Junes, MD     Take 1 tablet (5 mg total) by mouth daily     metoprolol (LOPRESSOR) 25 MG tablet 09/06/2012 at 700  --  --  [provider]     pantoprazole (PROTONIX) 20 MG EC tablet 09/06/2012 at 700  --  --  [provider]        Current Facility-Administered Medications   Medication   . Lactated Ringers Infusion   . sodium chloride IV   . lidocaine 1 % injection 0.1 mL   . ceFAZolin in dextrose (ANCEF) 2-3 GM-% DUPLEX bag     Admission Medications:  Scheduled Meds     . ceFAZolin in dextrose     IV Meds   PRN Meds     . lidocaine 0.1 mL at 09/06/12 1208     Anesthesia EvaluationInformation Source: per patient, per records  General  Pertinent (-):  history of anesthetic complications or FamHx of anesthetic complications    HEENT    + Corrective Eyewear            glasses    Pulmonary   Denies pulmonary issues Cardiovascular  Excellent(10+METs) Exercise  Tolerance    + Hypertension            well controlled    GI/Hepatic/Renal  Last PO Intake: >8hr before procedure      + GERD            well controlled    + Hiatal hernia Neuro/Psych    Denies neuro/psych issues    Endo/Other    Denies endo issues    Hematologic  Denies hematologic issues     Nursing Reported PO Status: Date Last PO Fluids: 09/05/12 2000  Date Last PO Solids: 09/05/12 1730  ______________________________________________________________________  Physical Exam    Airway            Mouth opening: normal            Mallampati: II            TM distance (fb): >3 FB  TM distance (cm): 3            Neck ROM: full  Dental   Normal Exam   Upper: dentures   Cardiovascular  Normal Exam    General Survey    Normal Exam   Pulmonary   pulmonary exam normal    Mental Status   Normal  evaluation    + Agitated         Most Recent Vitals: BP: 146/68 mmHg (09/06/12 1133)  Heart Rate: 62 (09/06/12 1133)  Temp: 36.8 C (98.2 F) (09/06/12 1133)  Resp: 16 (09/06/12 1133)  Height: 167.6 cm (5\' 6" ) (09/06/12 1133)  Weight: 89.359 kg (197 lb) (09/06/12 1133)  BMI (Calculated): 31.9 (09/06/12 1133)  SpO2: 97 % (09/06/12 1133)    Vital Sign Ranges (last 24hrs)  Temp:  [36.8 C (98.2 F)] 36.8 C (98.2 F)  Heart Rate:  [62] 62  Resp:  [16] 16  BP: (146)/(68) 146/68 mmHg        Most Recent Lab Results   Blood Type  Lab Results   Component Value Date    ABORH A RH POS 09/06/2012    ABS Negative 09/06/2012   CBC  Lab Results   Component Value Date    WBC 9.2 08/29/2012    HCT 45 08/29/2012    PLT 203 08/29/2012   Chem-7  Lab Results   Component Value Date    NA 142 08/29/2012    K 4.7 08/29/2012    CL 104 08/29/2012    CO2 27 08/29/2012    UN 16 08/29/2012    CREAT 0.95 08/29/2012    GLU 85 08/29/2012   Estimated Creatinine Clearance: 62.91 ml/min (based on Cr of 0.95).  Electrolytes  Lab Results   Component Value Date    CA 9.2 08/29/2012   Coags  No results found for this basename: PTI, INR, PTT   LFTs  Lab Results   Component  Value Date    AST 22 08/29/2012    ALT 42* 08/29/2012    ALK 77 08/29/2012     Bilirubin,Total   Date Value Range Status   08/29/2012 0.7  0.0 - 1.2 mg/dL Final     Pregnancy Status: Hysterectomy [3]  No LMP recorded. Patient has had a hysterectomy.    No results found for this basename: PUPT, UPREG, SPREG, HCG1, BHCG2, BHCG, HCGB     ECG Results  Lab Results   Component Value Date/Time    RATE 59 08/29/2012 11:47 AM    RATE 67 01/01/2011 11:45 AM    STATEMENT SINUS RHYTHM 08/29/2012 11:47 AM    STATEMENT Normal sinus rhythm, rate 67 01/01/2011 11:45 AM     ANES CPM    Radiology: No relevant studies     ________________________________________________________________________  Medical Problems  Patient Active Problem List    Diagnosis Date Noted   . DCIS, right breast, ER/PR positive 08/29/2012   . 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 Hypertension 01/01/2011     Created by Conversion       . Palpitations (Symptom) 01/01/2011     Created by Conversion           PreOp/PreProcedure Diagnosis (For more detail see procedural consent)            Right Breast Carcinoma in Situ  Planned Procedure (For more detail see procedural consent)            Right Breast Partial Mastectomy  Plan  ASA Score 2  Anesthetic Plan (general); Induction (routine IV); Airway (LMA); Line ( use current access); Monitoring (standard ASA); Positioning (supine and arms out); Pain (per surgical team); PostOp (PACU)    Informed Consent     Risks:          Risks discussed were commensurate with the plan listed above with the following specific points:  N/V, aspiration, sore throat , damage to:(eyes, nerves, teeth), awareness, unexpected serious injury, allergic Rx    Anesthetic Consent:         Anesthetic plan and risks discussed with:  patient and adult children    Plan discussed with:  surgeon      Attending Attestation: The patient or proxy understand and accept the risks and benefits of the anesthesia plan. By accepting this note, I  attest that I have personally performed the history and physical exam and prescribed the anesthetic plan within 48 hours prior to the anesthetic as documented by me above.    Author: Crecencio Mc, MD

## 2012-09-08 ENCOUNTER — Encounter: Payer: Self-pay | Admitting: Oncology

## 2012-09-08 LAB — SURGICAL PATHOLOGY

## 2012-09-12 ENCOUNTER — Encounter: Payer: Self-pay | Admitting: Oncology

## 2012-09-12 ENCOUNTER — Ambulatory Visit: Payer: Self-pay | Admitting: Oncology

## 2012-09-12 VITALS — BP 155/64 | HR 60 | Temp 97.0°F | Resp 18 | Ht 65.98 in | Wt 200.6 lb

## 2012-09-12 DIAGNOSIS — E559 Vitamin D deficiency, unspecified: Secondary | ICD-10-CM | POA: Insufficient documentation

## 2012-09-12 DIAGNOSIS — R002 Palpitations: Secondary | ICD-10-CM

## 2012-09-12 DIAGNOSIS — I10 Essential (primary) hypertension: Secondary | ICD-10-CM

## 2012-09-12 DIAGNOSIS — D0590 Unspecified type of carcinoma in situ of unspecified breast: Secondary | ICD-10-CM

## 2012-09-12 DIAGNOSIS — Z9010 Acquired absence of unspecified breast and nipple: Secondary | ICD-10-CM

## 2012-09-12 MED ORDER — ERGOCALCIFEROL 50000 UNIT PO CAPS *I*
50000.0000 [IU] | ORAL_CAPSULE | ORAL | Status: AC
Start: 2012-09-12 — End: 2012-11-29

## 2012-09-12 NOTE — Progress Notes (Signed)
[facility] SOCIAL WORK  PHARMACY FORM  Today's date:  September 12, 2012    Patient Name: Stacie Kim   Medical Record #:  [id number]       Patient's Address:  [address]                     DOB:  10/28/1940      Social Worker: Serafina Mitchell, LMSW      Date of Service: September 12, 2012         Funding Source   []  Medicaid Pending       [x]  [place] Medication Assistance Fund   []  [employer]     [] Other source                    ___________________________________________________________________  Pharmacy Information:     Date/time sent:September 12, 2012 Time needed: ASAP       []  ED []  After hours [x]  Outpatient []  Inpatient (Specify unit) Room/bed info not found     []  Please call ext.     when ready  [x]  Patient will pick up at pharmacy   []  Please tube to station #      when ready      Pharmacy Contact:Tammy     Social work signature: Serafina Mitchell, LMSW  Supervisor/Manager Approval (if indicated) :         Date:  (supervisor signature not required for [facility] pending)    Pharmacy Fax # [phone]  Tube [phone]  Print form and fax or tube to pharmacy.  If it requires Supervisor approval copy and email to supervisor

## 2012-09-12 NOTE — Patient Instructions (Addendum)
Stool softener - Colace (ducosate sodium) 100 mg one or two daily for constipation until resolved  Pick up Vit D prescription at [facility] pharmacy and take once per week for 12 weeks and then recheck lab test about 2 weeks later.  AM of surgery take lopressor and protonix with sip of water - otherwise nothing to eat or drink after midnight before surgerySurgical Site Infections FAQs    What is a Surgical Site infection (SSI)?  A surgical site infection is an infection that occurs after surgery In the part of the body where the surgery took place. Most patients who have surgery do not develop an infection. However, Infections develop in about 1 to 3 out of every 100 patients who have surgery.     Some of the common symptoms of a surgical site infection are:   Marland Kitchen Redness and pain around the area where you had surgery   . Drainage of cloudy fluid from your surgical wound   . Fever     Can SSIs be treated?   Yes. Most surgical site infections can be treated with antibiotics. The antibiotic given to you depends on the bacteria (germs) causing the Infection. Sometimes patients with SSIs also need another surgery to treat the infection.     What are some of the things that hospitals are doing to prevent SSls?   To prevent SSIs, doctors, nurses, and other healthcare providers:   . Clean their hands and arms up to their elbows with an antiseptic agent just before the surgery.   . Clean their hands with soap and water or an alcohol-based hand rub before and after caring for each patient.   . May remove some of your hair Immediately before your surgery using electric clippers If the hair Is in the same area where the procedure will occur. They should not shave you with a razor.   . Wear special hair covers, masks, gowns, and gloves during surgery to keep the surgery area clean.   . Give you antibiotics before your surgery starts. in most cases, you should get antibiotics within 60 mInutes before the surgery starts and the  antibiotics should be stopped within 24 hours after surgery.   . Clean the skin at the site of your surgery with a special soap that kills germs.     What can I do to help prevent SSIs?   Before your surgery:   . Tell your doctor about other medical problems you may have. Health problems such as allergies, diabetes, and obesity could affect your surgery and your treatment.   . Quit smoking. Patients who smoke get more Infections. Talk to your doctor about how you can quit before your surgery.   . Do not shave near where you will have surgery. Shaving with a razor can Irritate your skin and make it easier to develop an infection.   At the time of your surgery:   . Speak up if someone tries to shave you with a razor before surgery. Ask why you need to be shaved and talk with your surgeon if you have any concerns.   . Ask if you will get antibiotics before surgery.   After your surgery:   . Make sure that your healthcare providers clean their hands before examining you, either with soap and water or an alcohol-based hand rub,      If you do not see your healthcare providers wash their hands,   please ask them to do so.    Marland Kitchen  Family and friends who visit you should not touch the surgical wound or dressings.   . Family and friends should clean their hands with soap and water or an alcohol-based hand rub before and after visiting you. If you do not see them clean their hands, ask them to clean their hands.   What do I need to do when I go home from the hospital?   . Before you go home, your doctor or nurse should explain everyt hing you need to know about taking care of your wound. Make sure you understand how to care for your wound before you leave the hospital.   . Always clean your hands before and after caring for your wound.   . Before you go home, make sure you know who to contact If you have questions or problems after you get home.   . If you have any symptoms of an Infection, such as redness and pain at the  surgery site, drainage, or fever, call your doctor immediately.   if you have additional questions, Please ask your doctor or nurse.

## 2012-09-12 NOTE — Progress Notes (Signed)
Subjective:       Stacie Kim presents to the [facility] 6 days following right  Partial Mastectomy Re excision .  She is complaining of pain in the right breast of 4/10 especially when first removes bra. She is only taking pain pill at bedtime. She rates cosmesis as 4/5, satisfied. She is Complaining of some constipation despite prunes and fluids. Last BM this am of small amount. Her daughter is here with her   Objective:       Vitals: BP 155/64  Pulse 60  Temp(Src) 36.1 C (97 F) (Temporal)  Resp 18  Ht 167.6 cm (5' 5.98")  Wt 91 kg (200 lb 9.9 oz)  BMI 32.4 kg/m2    General:  alert, appears stated age and cooperative       Breast Incision: healing well, no dehiscence, no redness, mild yellowing ecchymosis, no significant swelling, upper outer quadrant   Cardiovascular: carotids without bruit, heart S1S2 RRR without murmur, gallop or rubs  Respiratory: breathing easily, lungs clear to ausculation    Pathology: Summary:  Ductal Carcinoma in situ (DCIS)  Size: spans approximately 2.5 cm  Grade = 2/3  Microinvasion: Not identified  ER: Positive, per outside report  PR: Positive, per outside report  Margins: Final margins:  Anterior: 1.5 mm  All other margins > 1 cm    PATHOLOGICAL STAGING: pTis Nx cMx    Labs: Vit D 25 OH level was 26 on 09/18/12  CBC and CMP unremarkable on 08/29/12    EKG NSR on 08/31/12    Assessment:      6 days following right  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 Doing well postoperatively.     Constipation   Vit D insufficiency  HTN controlled  Plan:      1. Continue any current medications. Urged to use good support or sports bra to minimize pain. Also can use ice prn. She may use Aleve for a few days but not in 7 days prior to surgery. Discussed diet and stool softener option for constipation.   2. Wound care discussed.  3. Discussed pathology and that anterior margin 1.5 mm is considered too close. We recommend re excision of  margin. Her other options are no surgery but then radiation would be more strongly recommended or mastectomy though not necessary at this point. She chose partial mastectomy re excision of margin. This will be booked on 09/28/12 at [facility]. We reviewed risks for bleeding, infection, change in sensation, fluid accumulation and again potential risk of needing more surgery. Informed consent signed. Instructed to not eat or drink after midnight before surgery except should take lopressor and protonix that am with a sip of water.   4. Begin range of motion exercises   5.Pt is to increase activities as tolerated.  6. We discussed tumor board recommendations that may not need radiation if can get good margins. Hormonal treatment would be discussed.   7. Return about 1 week post op.  8. Vitamin D: The most recent value was 26.  Based on rather compelling epidemiologic data showing an association between low vitamin D levels and breast cancer risk, we recommend that levels be maintained at the high end of the normal range (40-80). We therefore recommend that she begin replacement therapy consisting of 16109 units once a week for twelve weeks. We will recheck her levels in 3 months.      Please schedule surgery as Follows:  Patient: Stacie Kim    Surgeon: Zenda Alpers    Diagnosis: DCIS, right breast, ER/PR positive    Primary site: Breast (Right)    Staging method: AJCC 7th Edition    Clinical: Stage 0 (Tis, N0, cM0) signed by Zenda Alpers, MD on 08/29/2012  1:57 PM    Pathologic free text: solid papillary type    Pathologic: Stage 0 (Tis (DCIS), N0, cM0)    Summary: Stage 0 (Tis (DCIS), N0, cM0)    Date:09/28/12               Time: tbd  Procedure Length: 1 hour                 Hospital: [facility]    Admission Type: Outpatient    Side: right    Procedure: Partial Mastectomy (96045) re excison of margins    Reconstruction Planned?: no    Wire Localization: Not needed    Nuclear Medicine:Not  needed    Anesthesia: General    MD/NP PRAT Needed: no      Reason done 09/12/12    Post Op Visit: Schedule 1 weeks postop

## 2012-09-28 ENCOUNTER — Ambulatory Visit
Admit: 2012-09-28 | Disposition: A | Discharge status: Home / Self Care | Payer: Self-pay | Source: Ambulatory Visit | Attending: Breast Surgery | Admitting: Breast Surgery

## 2012-09-28 DIAGNOSIS — Z9010 Acquired absence of unspecified breast and nipple: Secondary | ICD-10-CM | POA: Insufficient documentation

## 2012-09-28 MED ORDER — CEFAZOLIN SODIUM-2000 MG DEXTROSE 3% DUPLEX IV *I*
2000.0000 mg | INTRAVENOUS | Status: AC
Start: 2012-09-28 — End: 2012-09-28
  Administered 2012-09-28: 1000 mg via INTRAVENOUS
  Filled 2012-09-28: qty 1

## 2012-09-28 MED ORDER — DIPHENHYDRAMINE HCL 50 MG/ML IJ SOLN *I*
25.0000 mg | INTRAMUSCULAR | Status: DC | PRN
Start: 2012-09-28 — End: 2012-09-28

## 2012-09-28 MED ORDER — SODIUM CHLORIDE 0.9 % IV SOLN WRAPPED *I*
20.0000 mL/h | Status: DC
Start: 2012-09-28 — End: 2012-09-29

## 2012-09-28 MED ORDER — LACTATED RINGERS IV SOLN *I*
20.0000 mL/h | INTRAVENOUS | Status: DC
Start: 2012-09-28 — End: 2012-09-28
  Administered 2012-09-28: 20 mL/h via INTRAVENOUS

## 2012-09-28 MED ORDER — PROMETHAZINE HCL 25 MG/ML IJ SOLN *I*
6.2500 mg | Freq: Once | INTRAMUSCULAR | Status: AC | PRN
Start: 2012-09-28 — End: 2012-09-28
  Administered 2012-09-28: 6.25 mg via INTRAVENOUS
  Filled 2012-09-28: qty 1

## 2012-09-28 MED ORDER — HYDROCODONE-ACETAMINOPHEN 5-325 MG PO TABS *I*
1.0000 | ORAL_TABLET | ORAL | Status: DC | PRN
Start: 2012-09-28 — End: 2012-09-29
  Administered 2012-09-28: 1 via ORAL
  Filled 2012-09-28: qty 1

## 2012-09-28 MED ORDER — MEPERIDINE HCL 25 MG/ML IJ SOLN *I*
12.5000 mg | INTRAMUSCULAR | Status: DC | PRN
Start: 2012-09-28 — End: 2012-09-28

## 2012-09-28 MED ORDER — HYDROCODONE-ACETAMINOPHEN 5-325 MG PO TABS *I*
1.0000 | ORAL_TABLET | ORAL | Status: DC | PRN
Start: 2012-09-28 — End: 2012-10-05

## 2012-09-28 MED ORDER — HYDROMORPHONE HCL PF 1 MG/ML IJ SOLN *WRAPPED*
0.4000 mg | INTRAMUSCULAR | Status: DC | PRN
Start: 2012-09-28 — End: 2012-09-28
  Administered 2012-09-28 (×6): 0.4 mg via INTRAVENOUS
  Filled 2012-09-28 (×3): qty 1

## 2012-09-28 MED ORDER — ONDANSETRON HCL 2 MG/ML IV SOLN *I*
1.0000 mg | Freq: Once | INTRAMUSCULAR | Status: DC | PRN
Start: 2012-09-28 — End: 2012-09-28

## 2012-09-28 MED ORDER — HALOPERIDOL LACTATE 5 MG/ML IJ SOLN *I*
0.5000 mg | Freq: Once | INTRAMUSCULAR | Status: DC | PRN
Start: 2012-09-28 — End: 2012-09-28

## 2012-09-28 MED ORDER — LIDOCAINE HCL 1 % IJ SOLN
0.1000 mL | INTRAMUSCULAR | Status: DC | PRN
Start: 2012-09-28 — End: 2012-09-28
  Administered 2012-09-28: 0.1 mL via SUBCUTANEOUS

## 2012-09-28 NOTE — Discharge Instructions (Signed)
[facility] Discharge Instructions    Date: 09/28/2012  Procedure: right breast re-excision of margins  Physician: Zenda Alpers, MD    You have received sedative medication and/or general anesthesia which may make you drowsy for as long as 24 hours:  A) DO NOT drive or operate [place] machinery for 24 hours  B) DO NOT drink alcoholic beverages for 24 hours  C) DO NOT make major decisions, sign contracts, etc. for 24 hours    Diet: Resume your previous diet    Pain Management: Per medication list below.      Medication List      ASK your doctor about these medications         ergocalciferol 50000 UNIT capsule   Commonly known as:  DRISDOL   Take 1 capsule (50,000 Units total) by mouth once a week       HYDROcodone-acetaminophen 5-325 MG per tablet   Commonly known as:  NORCO   Take 1-2 tablets by mouth every 6 hours as needed for Pain   MDD 8 tablets       lisinopril 5 MG tablet   Commonly known as:  PRINIVIL,ZESTRIL   Take 1 tablet (5 mg total) by mouth daily       metoprolol 25 MG tablet   Commonly known as:  LOPRESSOR       MULTIVITAMIN PO       pantoprazole 20 MG EC tablet   Commonly known as:  PROTONIX             Norco/Percocet contains tylenol (acetaminophen). Please do not take tylenol or tylenol containing products while on these medications to avoid tylenol overdose. Many over the counter medications contain acetaminophen. Do not exceed 4000 mg of tylenol (acetaminophen) per day.     CARE OF THE INCISION - After the surgery, the breast incision will be covered with a Topical Skin Adhesive. The film will usually remain in place for 5 - 10 days then naturally fall off your skin.  Do not scratch, rub, or pick at the adhesive skin film.  This may loosen the film before your wound is healed.  If you are required to place a dressing over the incision, do not place tape over the skin adhesive since this may remove the skin adhesive. The stitches are underneath the skin and will dissolve.  You may shower  tonight. Do not scrub the incision.  Just let the water run over it and then gently pat it dry.    The breast may look bruised(black and blue) and swollen after a biopsy is done.  The swelling will go down and the bruising will fade after a week or so.    COMFORT MEASURES - Wearing a bra day and night after your biopsy can be comfortable for the first week.  This will prevent the breast from pulling on the stitches and minimize any pain or discomfort that you may have.  Most women find a "sport bra" to be more comfortable than the traditional styles.  An ice pack may be applied to the biopsy site for 20 minutes every hour for the first 24 - 48 hours after surgery.  You may also take Tylenol, aspirin, or ibuprofen(Motrin, Advil) as needed to relieve any pain that you have.     FOLLOW-UP OFFICE VISIT - You need to be seen in the office one to two weeks after your breast biopsy.  This visit can be scheduled as soon as you know the  date of your surgery.    PATHOLOGY RESULTS - Information about your breast surgery will usually be available five to seven business days after your operation.  You will be called at home with the results, usually in the evening.    ACTIVITY/RETURN TO WORK - Limit your activities after surgery to what is comfortable for you.  You should plan to return to work the day after your surgery.  Some women have found though, that they need an extra day or two of rest before they are ready to resume their usual schedules.    WHEN TO CALL THE OFFICE - Do not hesitate to call the office if you develop a fever(temperature greater than 101), shaking chills, bleeding or drainage from your incision, redness around the incision, persistent or increased pain, or with any other problem that concerns you.  The office is open 9a.m. to 5p.m. Monday through Friday.  After hours and weekends the answering service will direct your call to the on call physician.    Call Dr. Zenda Alpers, MD  at [phone] with  questions/concerns.

## 2012-09-28 NOTE — OR PreOp (Signed)
Day of Surgery/Procedure Update:  History  History reviewed and no change    Physical  Physical exam updated and no change

## 2012-09-28 NOTE — INTERIM OP NOTE (Signed)
Interim Op Note    Date of Surgery: 09/28/2012  Surgeon: Dr. Jenne Pane  Co-Surgeon:   First Assistant: Dr. Ivory Broad  Second Assistant:     Pre-Op Diagnosis: DCIS right breast    Anesthesia Type: General    Post-Op Diagnosis:    Primary: same  Secondary:   Tertiary:     Additional Findings (Including unexpected complications): none    Procedure(s) Performed (including CPT 4 Code if available)   partial mastectomy right breast, re-excision of margin    Estimated Blood Loss: 1 cc  Packing: No  Drains: No  Fluid Totals: Intakes: 800 cc Outputs:   Specimens to Pathology: yes  Patient Condition: good

## 2012-09-28 NOTE — Op Note (Signed)
OPERATIVE NOTE    Stacie Kim  [id number]    Date of Procedure: 09/28/2012    Preoperative Diagnosis: Right Breast DCIS with close Anterior margins    Postoperative Diagnosis: Right Breast DCIS with close Anterior margins    PROCEDURE: Right Reexcision Partial Mastectomy (19301)    Additional findings (including unexpected complications):  none    Attending Surgeon:Cedrica Brune Basilia Jumbo, MD    Anesthesia: General LMA anesthesia, Local.    Indications for Procedure: Stacie Kim is a 71 y.o. year old female who underwent partial mastectomy for DCIS, right breast, ER/PR positive, Pathologic: Stage 0 (Tis (DCIS), N0, cM0) At pathology the ductal carcinoma in-situ was within 2 mm of the Anterior margin, and reexcision was recommended.    Description of Procedure: After informed consent was obtained, the surgical site marked and the Day of Surgery Update completed, the patient was taken to the operating room and positioned in a supine position on the operating room table. General LMA anesthesia was induced without difficulty. SCDs were placed for DVT prophylaxis. 2gm of Ancef was given within 60 minutes prior to incision for surgical prophylaxis. Lower body Bair Hugger was applied to help maintain normothermia.     The right neck, shoulder, axilla, breast, and chest wall were then prepped and draped in the usual sterile fashion. Surgical time-out was completed. Preemptive analgesia was achieved using 1% lidocaine and 0.5% Marcaine 50:50 mixture by local infiltration.  After adequate anesthesia was assured the prior skin incision was reopened with a 15 blade scalpel.  Subcutaneous tissues were sharply divided down to and into the lumpectomy cavity using Bovie cautery.  The entire Anterior margin of the the cavity was excised using Metzenbaum scissors. The new margin was laid out on Telfa and oriented. The specimen was labeled new Anterior margin. The specimen was hand carried to pathology where it was oriented for the pathologist.  Margins were inked and the specimen breadloafed and examined by inspection and palpation with the attending pathologist. Grossly all margins appeared adequate.   We returned to the operating room. Hemostasis was achieved with a combination of direct pressure and Bovie cautery. The breast cavity was marked with hemoclips. After hemostasis was assured, the wound was irrigated with sterile saline solution. Surgical sweep was performed. Again hemostasis was confirmed, and the breast incision was then closed in 2 layers.  The superficial subcutaneous tissues were reapproximated with interrupted deep dermal inverted 3-0 Vicryl sutures. The skin was closed with a running 4-0 Moncryl subcuticular closure. Dermabond tissue glue was applied to both wounds.    Anesthesia was reversed and the patient was extubated without difficulty. She was transferred to the PACU in good condition having tolerated the procedure well. There were no complications. Estimated blood loss was less than 50 cc. Final sponge and needle counts x 2 were correct at the end of the case.        Estimated Blood Loss:  50cc  Packing:  No  Drains:  No  Fluid Totals: See OR record   Specimens to Pathology:  yes  Patient Condition:  good    Signed:  Zenda Alpers, MD   on 09/28/2012 at 4:46 PM

## 2012-09-28 NOTE — Anesthesia Pre-procedure Eval (Signed)
Anesthesia Pre-operative Evaluation for Stacie Kim      Health History  Past Medical History   Diagnosis Date   . Hypertension 01/01/2011   . Palpitations (Symptom) 01/01/2011     Past Surgical History   Procedure Laterality Date   . Cholecystectomy       Cholecystectomy Conversion Data    . Hysterectomy       Hysterectomy Conversion Data      Social History  History   Substance Use Topics   . Smoking status: Never Smoker    . Smokeless tobacco: Never Used   . Alcohol Use: No      History   Drug Use No     ______________________________________________________________________  Allergies:   Allergies   Allergen Reactions   . Codeine        Headache;    . No Known Latex Allergy      Prior to Admission Medications    Last Medication Reconciliation Action:  Up-To-Date Reggy Eye, RN 09/28/2012 12:22 PM              Last Dose Start Date End Date Provider     HYDROcodone-acetaminophen (NORCO) 5-325 MG per tablet Past Month at Unknown  09/06/12  10/06/12  Jefm Bryant, PA     Take 1-2 tablets by mouth every 6 hours as needed for Pain   MDD 8 tablets     Multiple Vitamins-Minerals (MULTIVITAMIN PO) Past Week at Unknown  --  --  [provider]     ergocalciferol (DRISDOL) 50000 UNIT capsule Past Month at Unknown  09/12/12  11/29/12  Iona Beard, NP     Take 1 capsule (50,000 Units total) by mouth once a week     lisinopril (PRINIVIL,ZESTRIL) 5 MG tablet 09/28/2012 at 0730  08/31/12  --  Honor Junes, MD     Take 1 tablet (5 mg total) by mouth daily     metoprolol (LOPRESSOR) 25 MG tablet 09/28/2012 at 0730  --  --  [provider]     pantoprazole (PROTONIX) 20 MG EC tablet 09/28/2012 at 0730  --  --  [provider]        Current Facility-Administered Medications   Medication   . Lactated Ringers Infusion   . lidocaine 1 % injection 0.1 mL   . ceFAZolin 2 g/50 mL dextrose DUPLEX BAG     Admission Medications:  Scheduled Meds     . cefazolin IV     IV Meds   PRN Meds     . lidocaine  0.1 mL at 09/28/12 1238     Anesthesia EvaluationInformation Source: per patient          Pulmonary   Denies pulmonary issues Cardiovascular    + Hypertension    GI/Hepatic/Renal  Last PO Intake: >8hr before procedure         Endo/Other    Denies endo issues         Nursing Reported PO Status: Date Last PO Fluids: 09/28/12 0730 (water with meds)  Date Last PO Solids: 09/27/12 1800  ______________________________________________________________________  Physical Exam    Airway            Mouth opening: normal            Mallampati: I            TM distance (fb): >3 FB  Dental    Upper: all teeth missing except marked   Cardiovascular  Rhythm: regular           Rate: normal     Pulmonary   pulmonary exam normal    Mental Status   Normal  evaluation         Most Recent Vitals: BP: 148/72 mmHg (09/28/12 1152)  Heart Rate: 62 (09/28/12 1152)  Temp: 36.3 C (97.3 F) (09/28/12 1152)  Resp: 16 (09/28/12 1152)  Height: 167.6 cm (5\' 6" ) (09/28/12 1152)  Weight: 90.266 kg (199 lb) (09/28/12 1152)  BMI (Calculated): 32.2 (09/28/12 1152)  SpO2: 99 % (09/28/12 1152)    Vital Sign Ranges (last 24hrs)  Temp:  [36.3 C (97.3 F)] 36.3 C (97.3 F)  Heart Rate:  [62] 62  Resp:  [16] 16  BP: (148)/(72) 148/72 mmHg        Most Recent Lab Results   Blood Type  Lab Results   Component Value Date    ABORH A RH POS 09/06/2012    ABS Negative 09/06/2012   CBC  Lab Results   Component Value Date    WBC 9.2 08/29/2012    HCT 45 08/29/2012    PLT 203 08/29/2012   Chem-7  Lab Results   Component Value Date    NA 142 08/29/2012    K 4.7 08/29/2012    CL 104 08/29/2012    CO2 27 08/29/2012    UN 16 08/29/2012    CREAT 0.95 08/29/2012    GLU 85 08/29/2012   Estimated Creatinine Clearance: 63.26 ml/min (based on Cr of 0.95).  Electrolytes  Lab Results   Component Value Date    CA 9.2 08/29/2012   Coags  No results found for this basename: PTI, INR, PTT   LFTs  Lab Results   Component Value Date    AST 22 08/29/2012    ALT 42* 08/29/2012    ALK 77  08/29/2012     Bilirubin,Total   Date Value Range Status   08/29/2012 0.7  0.0 - 1.2 mg/dL Final     Pregnancy Status: Hysterectomy [3]  No LMP recorded. Patient has had a hysterectomy.    No results found for this basename: PUPT, UPREG, SPREG, HCG1, BHCG2, BHCG, HCGB     ECG Results  Lab Results   Component Value Date/Time    RATE 59 08/29/2012 11:47 AM    RATE 67 01/01/2011 11:45 AM    STATEMENT SINUS RHYTHM 08/29/2012 11:47 AM    STATEMENT Normal sinus rhythm, rate 67 01/01/2011 11:45 AM     ANES CPM    Radiology: No relevant studies     ________________________________________________________________________  Medical Problems  Patient Active Problem List    Diagnosis Date Noted   . Vitamin D insufficiency 09/12/2012   . S/P partial mastectomy re excision of margins 09/06/12 09/06/2012   . DCIS, right breast, ER/PR positive 08/29/2012   .  Right stereotactic localization excisional breast biopsy using the 22-mm SiteSelect device. 08/17/12 08/17/2012   . 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 Hypertension 01/01/2011     Created by Conversion       . Palpitations (Symptom) 01/01/2011     Created by Conversion           PreOp/PreProcedure Diagnosis (For more detail see procedural consent)            R breast cancer  Planned Procedure (For more detail see procedural consent)            R breast partial mastectomy, re-excision of  margin  Plan   ASA Score 2  Anesthetic Plan (general); PostOp (PACU)    Informed Consent     Risks:          Risks discussed were commensurate with the plan listed above with the following specific points:  N/V, aspiration, sore throat , damage to:(eyes, nerves, teeth), allergic Rx, unexpected serious injury, death    Anesthetic Consent:         Anesthetic plan and risks discussed with:  patient      Attending Attestation: The patient or proxy understand and accept the risks and benefits of the anesthesia plan. By accepting this note, I attest that I have personally performed the  history and physical exam and prescribed the anesthetic plan within 48 hours prior to the anesthetic as documented by me above.    Author: Althea Charon, MD

## 2012-09-29 NOTE — Anesthesia Post-procedure Eval (Signed)
Anesthesia Post-op Note    Patient: Stacie Kim    Procedure(s) Performed: breast re-excision    Anesthesia type: General    Patient location: 2nd Stage Recovery    Mental Status: Recovered to baseline    Patient able to participate in this evaluation: yes  Last Vitals: BP: 128/62 mmHg (09/28/12 1853)  BP MAP : 63 mmHg (09/28/12 1745)  Heart Rate: 56 (09/28/12 1853)  Temp: 36.3 C (97.3 F) (09/28/12 1615)  Resp: 16 (09/28/12 1853)  Height: 167.6 cm (5\' 6" ) (09/28/12 1152)  Weight: 90.266 kg (199 lb) (09/28/12 1152)  BMI (Calculated): 32.2 (09/28/12 1152)  SpO2: 96 % (09/28/12 1853)      Post-op vital signs noted above are within patient's normal range  Post-op vitals signs: stable  Respiratory function: baseline    Airway patent: Yes    Cardiovascular and hydration status stable: Yes    Post-Op pain: Adequate analgesia    Post-Op nausea and vomiting: none    Post-Op assessment: no apparent anesthetic complications    Complications: none    Attending Attestation: All indicated post anesthesia care provided    Author: Althea Charon, MD  as of: 09/29/2012  at: 8:27 AM

## 2012-10-04 LAB — SURGICAL PATHOLOGY

## 2012-10-05 ENCOUNTER — Encounter: Payer: Self-pay | Admitting: Oncology

## 2012-10-05 ENCOUNTER — Ambulatory Visit: Payer: Self-pay | Admitting: Oncology

## 2012-10-05 ENCOUNTER — Telehealth: Payer: Self-pay | Admitting: Breast Surgery

## 2012-10-05 VITALS — BP 147/83 | HR 70 | Temp 97.5°F | Resp 18 | Ht 65.98 in | Wt 200.8 lb

## 2012-10-05 DIAGNOSIS — D0590 Unspecified type of carcinoma in situ of unspecified breast: Secondary | ICD-10-CM

## 2012-10-05 DIAGNOSIS — Z9010 Acquired absence of unspecified breast and nipple: Secondary | ICD-10-CM

## 2012-10-05 DIAGNOSIS — E559 Vitamin D deficiency, unspecified: Secondary | ICD-10-CM

## 2012-10-05 NOTE — Progress Notes (Signed)
Subjective:       Stacie Kim presents to the [facility] 7 days following right  Partial Mastectomy Re excision She is complaining of mild pain relieved with extra strength tylenol, She rates cosmesis as 5/5 very satisfied. She is accompanied by her daughter Stacie Kim stating she "forgets things that are said".  Objective:       Vitals: BP 147/83  Pulse 70  Temp(Src) 36.4 C (97.5 F) (Temporal)  Resp 18  Ht 167.6 cm (5' 5.98")  Wt 91.1 kg (200 lb 13.4 oz)  BMI 32.43 kg/m2    General:  alert, appears stated age and cooperative       Breast Incision: healing well, no dehiscence, no redness or swelling     Pathology: Surgical Pathology FINAL DIAGNOSIS:    Breast, right, new anterior margin, re-excision:  - Prior biopsy site change present.  - Proliferative fibrocystic change present, including adenosis,  ductal hyperplasia without atypia, cyst formation  and apocrine metaplasia.  - No residual carcinoma or malignancy identified.      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 Doing well postoperatively.    Vit D insufficiency     Plan:      1. Continue any current medications.  2. Wound care discussed.  3.  Explained pathology. No further surgery needed.  4. Begin range of motion exercises.  5.Pt is to increase activities as tolerated.  6. Schedule consultation with medical oncology for discussion regarding hormonal treatment option per tumor board recommendation.   Tumor board recommendation states "radiation if margins are not generous". We will therefore defer to Dr. Basilia Kim to evaluate this and decide if she should discuss radiation ie have a radiation consult or not and call [name] with her thoughts.   7. Return in  1 month for ongoing followup.  8. Vitamin D: The most recent value was 26 on 08/29/12. Based on rather compelling epidemiologic data showing an association between low vitamin D levels and breast cancer risk, we  recommend that levels be maintained at the high end of the normal range (40-80). We therefore recommend that she continue her current regimen of  45409 units once a week for twelve weeks. We will recheck her levels about 2 weeks after completion and order is in the system.

## 2012-10-11 ENCOUNTER — Telehealth: Payer: Self-pay | Admitting: Breast Surgery

## 2012-10-11 NOTE — Telephone Encounter (Signed)
Pt distressed that her app't for consultation with medical oncology is not until February and pt does not see app't for radiology oncology. Pt anxious and really wants an earlier app't This Clinical research associate said that S.Mandeville,NP will be consulted and perhaps pt can be seen @ [facility] (an affiliate of [facility]) for both her Med and Rad. Onc consultations.Pt is agreeable to this and understands that she will be notified of new app't.   S.Mandeville,NP said that pt may see medical oncologist @ [facility] and as for rad. Onc. , Dr Basilia Jumbo needs to be consulted as to whether or not pt needs a radiation oncology consult. This Clinical research associate will forward message to both Dr Basilia Jumbo and Prince Rome, Diplomatic Services operational officer. Sheri,RN

## 2012-11-01 ENCOUNTER — Ambulatory Visit: Payer: Self-pay | Admitting: Oncology

## 2012-11-01 ENCOUNTER — Encounter: Payer: Self-pay | Admitting: Oncology

## 2012-11-01 VITALS — BP 143/63 | HR 74 | Temp 97.5°F | Resp 16 | Ht 65.98 in | Wt 200.2 lb

## 2012-11-01 DIAGNOSIS — Z780 Asymptomatic menopausal state: Secondary | ICD-10-CM

## 2012-11-01 DIAGNOSIS — D0590 Unspecified type of carcinoma in situ of unspecified breast: Secondary | ICD-10-CM

## 2012-11-01 MED ORDER — ANASTROZOLE 1 MG PO TABS *I*
1.0000 mg | ORAL_TABLET | Freq: Every day | ORAL | Status: DC
Start: 2012-11-01 — End: 2013-12-31

## 2012-11-01 NOTE — Patient Instructions (Signed)
Your doctor has recommended starting on a medication called Arimidex or the generic name for this drug is Anastrozole.  You will remain on the medication for 5 years.     You should start the medication about 1 week after you complete radiation.      Here are tips on how to take the correct dosage of ARIMIDEX  Take one tablet at the same time each day   If you forget to take a tablet, take one as soon as you remember. If it is almost time for your next dose, skip the missed dose   Do not take two tablets at once to make up for a missed dose   Always take ARIMIDEX as your doctor prescribes     SIDE EFFECTS   Prescription ARIMIDEX (anastrozole) is only for postmenopausal women. ARIMIDEX should not be taken if you are pregnant because it may harm your unborn child.  ARIMIDEX should not be taken with tamoxifen or estrogen-containing therapies.    Based on information from a study of patients with early breast cancer, women with a history of blockages in heart arteries (ischemic heart disease) who take ARIMIDEX may have a slight increase in this type of heart disease compared with similar patients who take tamoxifen.    ARIMIDEX (anastrozole) can cause bone softening/weakening (osteoporosis), increasing the chance of fractures. In a clinical study in early breast cancer, there were more fractures (including fractures of the spine, hip, and wrist) with ARIMIDEX (10%) than with tamoxifen (7%).   Your doctor will instruct you when to have your bone density checked with a test called a Dexa Scan.    In a clinical study in early breast cancer, some patients taking ARIMIDEX had an increase in cholesterol. Skin reactions, allergic reactions, and changes in blood tests of liver function have also been reported.    In the early breast cancer adjuvant clinical trial, the most common side effects seen with ARIMIDEX include hot flashes, joint symptoms (including arthritis and arthralgia), weakness, mood changes, pain, back pain,  sore throat, nausea and vomiting, rash, depression, high blood pressure, osteoporosis, fractures, swelling of arms/legs, insomnia, and headache.    In advanced breast cancer trials, the most common side effects seen with ARIMIDEX versus tamoxifen include hot flashes, nausea, decreased energy and weakness, pain, back pain, headache, bone pain, increased cough, shortness of breath, sore throat, and swelling of arms and legs. Joint pain/stiffness has been reported in association with the use of ARIMIDEX (anastrozole).    Managing side effects of ARIMIDEX  ARIMIDEX may cause side effects. Some may be serious. Stop taking ARIMIDEX and call your doctor right away if you experience any of the following:  Chest pain or shortness of breath; these can be symptoms of heart disease   Skin lesions, ulcers, or blisters   Signs or symptoms of a liver problem   Yellowing of the skin or whites of the eyes   Pain on the right side of your abdomen    If you are experiencing any problems or have questions about the medication, please contact the office [phone]) and ask to speak with a nurse.    Regarding your bone health    I have requested a DXA scan as baseline screening for osteoporosis.  You can get this done anytime in the next 6 months.  Further monitoring of bone density should be repeated every 1-2 years. We reviewed the first step in the prevention or treatment of osteoporosis is ensuring adequate nutrition,  particularly maintaining an adequate intake of calcium and vitamin D. In general, for postmenopausal women, 1000-1200 mg of elemental calcium daily and 1000 int. units of vitamin D daily are suggested.

## 2012-11-04 ENCOUNTER — Encounter: Payer: Self-pay | Admitting: Oncology

## 2012-11-04 NOTE — Progress Notes (Signed)
INITIAL CONSULTATION     I had the pleasure of seeing Stacie Kim  at the [facility] today.  She was referred by Dr. 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        Stacie Kim  is a very pleasant 72 y.o. postmenopausal female that underwent screening mammography on 07/18/12 in [place], [place].  The exam revealed an area of asymmetry measuring 9 mm in the upper outer right breast.  A corresponding 0.6 cm irregular mass was seen on ultrasound.  She underwent a core biopsy that revealed atypical ductal hyperplasia.  Postbiopsy imaging  revealed clip migration and likely that there was undersampling of the lesion.   The biopsy was reviewed at [facility].  The cytology favored at least in situ carcinoma, and the solid papillary carcinoma with endocrine differentiation was favored.  An invasive carcinoma could not be ruled out she given the detached and fragmented nature of the biopsy.      She was referred to Dr. Zenda Alpers for further recommendations and surgical management.  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  The tumor was ER positive (Allred score 8), PR positive (Allred score 8).  The superior and posterior margins were positive and the inferior margin < 1 mm. Lymph nodes were not sampled.  Pathology comments that the carcinoma is present on 4 consecutive sections from the entirely submitted specimen. The greatest dimension on any one slide is 1.3 cm, however, the tumor may be up to 2.5 cm greatest dimension bycalculation (13.3 cm divided by 21 sections multiplied by 4 sections).    Stacie Kim underwent right partial mastectomy on 09/06/12.  Pathology revealed ductal carcinoma in situ (DCIS) measuring approximately 2.5 cm, Grade 2/3, with no  microinvasion. There was punctate area of necrosis. The closest anterior margin was 1.5 mm, with all other margins > 1 cm.  Lymph nodes were not sampled.  Given the extent of disease, she had a re-excision for the anterior margin on 09/28/12 which revealed no residual carcinoma.  She has done well postoperatively, without any complications.  She denies breast pain or swelling.  She presents today with her daughter to discuss postoperative management.      PRIOR BREAST HISTORY:   She underwent menarche at 14 years.  She is postmenopausal.  She had a hysterectomy at age 36.  Her ovaries are intact. She is G5 P5 with age at 31st delivery of 72 years old.  She did not use OCP's.  She did not use HRT.  ]She has had no prior breast biopsies/surgery.      PAST MEDICAL/SURGICAL HISTORY:   She has a past medical history of hypertension and palpitations.  She has past surgical history that includes Cholecystectomy (1967); Hysterectomy (2006); and Tonsillectomy and adenoidectomy.     SOCIAL/FAMILY HISTORY:     History   Substance Use Topics   . Smoking status: Never Smoker    . Smokeless tobacco: Never Used   . Alcohol Use: No     History     Social History Narrative    She is divorced.  She lives alone in [place], [place].  She is a retired Designer, jewellery.  She has 5 adult children who are very suppportive.  She has a  college education.      She has no family history of breast cancer.  She has no family history of ovarian cancer.      Current Medications:   Reviewed and updated in the EMR as appropriate.     ALLERGIES:   Codeine - headaches     REVIEW OF SYSTEMS:   CONSTITUTIONAL:  She has no symptoms.    SKIN:   She has no problems.   HEENT:   She has mild hearing loss and tinnitus.    CARDIAC/RESPIRATORY:   She has an irregular heartbeat and intermittent palpitations.    GASTROINTESTINAL:   She has occasional heartburn.    GYNECOLOGICAL:  She has no problems.  She has annual mammograms, the last in Sept 2013 ([facility]).       GENITOURINARY:   She has no problems.   NEUROLOGIC:   She has no problems.    ENDOCRINE:   She has no problems.    MUSCULOSKELETAL:   She has no problems.  Last DXA scan - patient unable to recall.  HEMATOLOGIC/LYMPHATIC:   She has no problems.    PSYCHIATRIC:   She has no psychiatric history.       PHYSICAL EXAM:   BP 143/63  Pulse 74  Temp(Src) 36.4 C (97.5 F) (Temporal)  Resp 16  Ht 167.6 cm (5' 5.98")  Wt 90.8 kg (200 lb 2.8 oz)  BMI 32.32 kg/m2   ECOG status 0.  Current pain score 0  General: The patient is in Good general condition, well-developed, well-nourished in No acute distress.    HEENT:  The sclera are anicteric. Oropharynx mucous membranes moist, pharynx normal without lesions.  No cervical or supraclavicular lymphadenopathy.  HEART:  Regular rate and rhythm, without murmurs, rubs, or gallops.   LUNGS:  clear to auscultation, no wheezes, rales or rhonchi, symmetric air entry  ABDOMEN:  Soft, nontender, nondistended without organomegaly or masses.    NEUROLOGIC:  Alert and oriented x 3.  Right-handed.  Normal strength.  Gait is normal.    EXTREMITIES:  No edema   SKIN:  No rashes or petechiae.  RECTAL:  exam deferred  BREAST: S/P right partial mastectomy.  Incisions are well-healed.   No suspicious masses, skin changes, nipple discharge, or axillary lymphadenopathy bilaterally.     LABORATORY:   No recent labs for review.    RADIOLOGY:   Breast imaging as reviewed above.    IMPRESSION:   Stage 0 Right Breast DCIS, solid papillary type, ER/PR (+)  S/P right lumpectomy and re-excision X 2 with negative margins  Postmenopausal female    RECOMMENDATIONS:     I discussed with the patient the diagnosis, including a review of her  pathology, staging, prognosis, and treatment options.  I reviewed the features of her cancer that determine the approach to treatment including the size, grade,  Absence of microinvasion or central necrosis and hormone receptor status. Overall, she has an excellent  prognosis.     I discussed that patients with DCIS are at an increased risk of invasive cancer in either breast, as well as an increased risk of DCIS in the opposite breast.  I reviewed the option of adjuvant hormone therapy or high-risk observation.  Adjuvant hormone therapy can lower her risk of ipsilateral and contralateral invasive and non-invasive breast cancers by 50%.   I discussed the option of  anastrozole (1 mg daily) taken for 5 years.  The most common adverse side effects were reviewed. Written information was  provided. After a long discussion, reviewing the pros and cons of chemoprevention vs high-risk observation, the patient has elected to start anastrozole.    She is scheduled to meet with Dr. Lovie Chol of Radiation Oncology later this month.  Her VNPI score is estimated 7-8.  Patients with intermediate scores (7-9) could be considered for treatment with radiation therapy to decrease the risk for locoregional recurrence.      For women who have undergone breast-conserving surgery, a post-treatment unilateral mammogram of the right breast should be obtained at least 6 months after surgery or completion of radiation therapy and continued every 6 months for the duration of 2 years. A yearly bilateral mammographic evaluation should also be performed.     Regular history, physical examination, and mammography are recommended for breast cancer follow-up. Physical examinations should be performed every 3 to 6 months for the first 3 years, every 6 to 12 months for years 4 and 5, and annually thereafter. This can be coordinated with between myself and her other health care providers.     I have requested a DXA scan as baseline screening for osteoporosis.  Further monitoring of bone density should be repeated every 1-2 years. We reviewed the first step in the prevention or treatment of osteoporosis is ensuring adequate nutrition, particularly maintaining an adequate intake of calcium and vitamin D. In  general, for postmenopausal women, 1000-1200 mg of elemental calcium daily and 1000 int. units of vitamin D daily are suggested.    Although the patient lives in [place], she would like to continue her follow up care here in [place].  We discussed the feasibility of receiving her radiation therapy here or closer at the [facility] in [place], [place] should she require treatments.  She is interested in receiving a recommendation from our Radiation Oncologist before making further decisions regarding location of treatment.  I will see if we can get her in sooner for a consult. She is also interested in a nutrition consult regarding general dietary counseling for better health.     Time was taken to answer her questions to the best of my ability and her  apparent satisfaction.  Time spent with the patient was 75 minutes with >50% spent in face-to-face education, counseling, and coordination of care. She expressed understanding of my recommendations and agrees with the treatment plan as outlined above.      I will see her back in 6 months for further follow up and monitoring.  She knows to call in the interim should she have additional questions or concerns.    Brynda Greathouse, MD  Medical Oncology    Dictated using Dragon voice recognition software. To expedite communication some inadvertent typographical and transcription errors generated by the transcription software may have been missed despite a reasonable effort to identify and correct them.

## 2012-11-07 NOTE — H&P (Addendum)
Radiation Oncology CONSULTATION (11/07/2012)    Patient Identifier:  Mrs. JALEEZA SHIMP is a 72 y.o. 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    Diagnosis:  Right breast DCIS, Stage pTis cN0 cM0, AJCC Stage 0.    History of Present Illness:   Mrs. SHAKIRIA PERL is a 72 y.o. woman with a past medical history significant for hypertension.    The patient was in her usual state of health until 07/18/2012 when she underwent a routine screening mammogram ([place], [place]) which revealed a new irregular 9 mm focal assymmetry in the upper outer quadrant of the right breast, 10.9 cm from the nipple with suggestion of associated architectural distortion. Subsequent diagnostic mammogram and ultrasound were performed also on 07/18/2012.  Spot compression view mammogram demonstrated persistence of an irregular 6 mm mass with partially obscured margins in the upper outer quadrant of the right breast.  Ultrasound showed in the 10:00 axis of the right breast, 10 cm from the nipple, and it irregular hypoechoic mass felt to correspond to the new mammographic mass.  The mass measured 5 x 5 x 6 mm.  Findings were suspicious for malignancy. On 08/08/2012 a stereotactic biopsy of right breast at the 10 o'clock position was significant for at least atypical ductal hyperplasia with epithelial fragments with a differential of low-grade DCIS.    Review of this pathology at the [facility] showed breast tissue with detached fragments of epithelial cells.  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    The patient was seen by Dr. Zenda Alpers on 08/08/12.  Right diagnostic mammogram was done that same day in the [facility].  A small  residual focal asymmetry was noted in the upper outer right breast at the site of prior biopsy.  On 08/17/12 right breast sites elected excisional biopsy was done.  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  Tumor size was at least 1.3 cm and might be up to 2.5 cm.  This was a single focus, low to focally intermediate nuclear grade.  The predominant pattern was solid papillary.  Necrosis was present, central, focal, possibly due to prior biopsy.  Lobular involvement by DCIS was present.  Estrogen receptor was positive at 99% with an Allred score of 8.  Progesterone receptor was positive at 99% with an Allred score of 8.  Distance to the closest margin was posterior and superior which were positive.  Comment was made that the carcinoma was present in 4 consecutive sections from the entirely submitted specimen.  The greatest dimension on any 1 slide was 1.3 cm, however the tumor might be up to 2.5 cm by greatest dimension by calculation.    Parke Poisson subsequently underwent a right reexcision partial mastectomy by Dr. Basilia Jumbo on 09/06/2012  Pathology showed a 2.5 cm DCIS, Grade: is as2 with no areas of microinvasion. The closest anterior margin was 1.5 mm with all other margins > 1cm.  On 09/28/12 the patient underwent right reexcision partial mastectomy.  No residual carcinoma or malignancy was identified.    The patient has subsequently been seen by Dr. Laroy Apple on 11/04/2012; she has recommended for patient to undergo adjuvant hormone therapy with anastrozole.    Past Breast History: She denies any prior breast masses,  nipple discharge, skin changes or significant breast pain. She denies any prior breast biopsies or other breast surgery.     Gynecologic History: She had menarche at age 61.  She is G5 P5, with her first child born at age 69.  She breast-fed her children.  She had a hysterectomy at the age of 58 and She has never used HRT.  She did not use oral  contraceptives.  An annual screening GYN exam was 1 years ago.    Active Problems:  Patient Active Problem List   Diagnosis Code   . Hypertension 401.9   . Palpitations (Symptom) 785.1   . 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 DCIS, right breast, ER/PR positive 233.0   .  Right stereotactic localization excisional breast biopsy using the 22-mm SiteSelect device. 08/17/12 V45.89   . S/P partial mastectomy re excision of margins 09/06/12 V45.71   . Vitamin D insufficiency 268.9   . S/P partial mastectomy re excision of margins 09/28/12 V45.71     Past Medical History:  Past Medical History   Diagnosis Date   . Hypertension 01/01/2011   . Palpitations (Symptom) 01/01/2011     Nuc stress     Past Surgical History:  Past Surgical History   Procedure Laterality Date   . Cholecystectomy  1967   . Hysterectomy  2006     Partial hystterectomy for uterine prolapse   . Tonsillectomy       Medications:  Current Outpatient Prescriptions   Medication   . anastrozole (ARIMIDEX) 1 MG tablet   . ergocalciferol (DRISDOL) 50000 UNIT capsule   . lisinopril (PRINIVIL,ZESTRIL) 5 MG tablet   . metoprolol (LOPRESSOR) 25 MG tablet   . Multiple Vitamins-Minerals (MULTIVITAMIN PO)   . pantoprazole (PROTONIX) 20 MG EC tablet     No current facility-administered medications for this visit.     Prior Chemotherapy/Radiation Therapy History:  The patient does not have a prior history of radiation or chemotherapy.    Allergies:  Allergies   Allergen Reactions   . Codeine        Headache;    . No Known Latex Allergy      Family History:  Family History   Problem Relation Age of Onset   . Conversion Other      [id number]^Hypertension^401.9^Active^   . Conversion Other      [id number] Artery ZOXWRUE^454.00^Active^   . Breast cancer Neg Hx    . Ovarian cancer Neg Hx      Social History:  Social History   . Marital Status: Legally Separated     Spouse Name: N/A     Number of Children: N/A   . Years of Education: N/A     Occupational  History   . 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     Social History Main Topics   . Smoking status: Never Smoker    . Smokeless tobacco: Never Used   . Alcohol Use: No   . Drug Use: No   . Sexually Active: Yes -- Female partner(s)     Social History Narrative    She is divorced.  She lives alone in [place], [place].  She is a retired Designer, jewellery.  She has 5 adult children who are very suppportive.  She has a Naval architect.      Review of Systems: As per history of present illness.  Review of Systems   Constitutional: Negative for fever, chills and weight loss.  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 Negative for neck pain.    Eyes: Negative for blurred vision, double vision and photophobia.   Respiratory: Negative for cough, hemoptysis and sputum production.    Cardiovascular: Negative for chest pain and palpitations.   Gastrointestinal: Negative for heartburn, nausea, vomiting, abdominal pain and diarrhea.   Genitourinary: Positive for urgency and frequency. Negative for dysuria.   Musculoskeletal: Positive for joint pain. Negative for myalgias.   Skin: Negative for itching and rash.   Neurological: Negative for dizziness, tingling, tremors, seizures and headaches.   Endo/Heme/Allergies: Does not bruise/bleed easily.   Psychiatric/Behavioral: Negative for depression.     Physical Examination:  Wt Readings from Last 3 Encounters:   11/08/12 90.8 kg (200 lb 2.8 oz)   11/01/12 90.8 kg (200 lb 2.8 oz)   10/05/12 91.1 kg (200 lb 13.4 oz)     Temp Readings from Last 3 Encounters:   11/08/12 36.5 C (97.7 F) Temporal   11/01/12 36.4 C (97.5 F) Temporal   10/05/12 36.4 C (97.5 F) Temporal     BP Readings from Last 3 Encounters:   11/08/12 110/56   11/01/12 143/63   10/05/12 147/83     Pulse Readings from Last 3 Encounters:   11/08/12 66   11/01/12 74   10/05/12 70      Physical Exam   Constitutional: She is oriented to person, place, and time. She appears well-developed and well-nourished.    HENT:   Head: Normocephalic and atraumatic.   Right Ear: External ear normal.   Left Ear: External ear normal.   Eyes: EOM are normal. Pupils are equal, round, and reactive to light. Right eye exhibits no discharge. Left eye exhibits no discharge. No scleral icterus.   Neck: No JVD present. No tracheal deviation present. No thyromegaly present.   Cardiovascular: Normal rate, regular rhythm, normal heart sounds and intact distal pulses.  Exam reveals no gallop and no friction rub.    No murmur heard.  Pulmonary/Chest: Effort normal and breath sounds normal. No stridor. No respiratory distress. She has no wheezes. She has no rales. She exhibits deformity (surgical scar is well healed). She exhibits no mass, no tenderness, no laceration and no retraction.   Healed surgical scar in the right mid axillary area. No other masses were detected in the breast or axilla.   Abdominal: Soft. She exhibits no distension and no mass. There is no tenderness. There is no rebound and no guarding. No hernia.   Musculoskeletal: Normal range of motion. She exhibits no edema and no tenderness.   Lymphadenopathy:     She has no cervical adenopathy.   Neurological: She is alert and oriented to person, place, and time. She displays normal reflexes. No cranial nerve deficit. She exhibits normal muscle tone. Coordination normal.   Skin: No rash noted. No erythema. No pallor.   Psychiatric: She has a normal mood and affect. Her behavior is normal. Judgment and thought content normal.   Breast Exam:  The patient was examined in the sitting up and supine positions.  The fairly good symmetry.  Both nipples are everted.  There is a well-healing lumpectomy scar in the upper outer quadrant right breast.  There is no visible or palpable suspicious dominant mass, skin change, nipple discharge or tenderness bilaterally.  Lymph node survey is negative.    Diagnostic Data Reviewed:  Both radiologic images and reports were personally reviewed and  discussed with Parke Poisson.  See above.  Laboratory Data Reviewed:  Lab results: 08/29/12  1550   WBC 9.2   Hemoglobin 14.5   Hematocrit 45   RBC 5.4*   Platelets 203       Chemistry        Lab results: 08/29/12  1550   Sodium 142   Potassium 4.7   Chloride 104   CO2 27   GFR,Caucasian 61   GFR,Black 71   UN 16   Creatinine 0.95        Lab results: 08/29/12  1550   Glucose 85   Calcium 9.2   Total Protein 7.0   Albumin 4.5   ALT 42*   AST 22   Alk Phos 77   Bilirubin,Total 0.7        Imaging and Pathology:  This was reviewed in detail with the patient.  See above.    IMPRESSION:  Mrs. AMAJAH DANDURAND is a 72 y.o. 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 We carefully reviewed events leading up to her diagnosis as well as her management to date. We a reviewed the multidisciplinary approach to the management of breast cancer.  The patient has completed her surgical care.  She desires a breast conserving approach for which she remains a good candidate.  A detailed discussion regarding the potential role of adjuvant hormonal therapy was had with Dr. Laroy Apple who has recommended for patient to be treated with a course of adjuvant anastrazole.  The patient will start this after completion of adjuvant radiation therapy course.    The majority of our conversation was related to the role of adjuvant radiation therapy in her care. We described the evidence to recommend adjuvant radiation including the results of the NSABP B-17 trial which showed a  decrease in the risk of local recurrence.  The patient's Judith Part prognostic index score is calculated as approximately 7 [size 2, age 50, pathology 2, margin 2].  She would benefit from adjuvant radiation therapy. The process of radiation therapy including treatment planning CT- simulation and a standard once daily course to the left whole breast followed by a tumor bed boost was discussed in detail. Various fractionation regimens were also discussed. The potential acute and late side effects of radiation therapy were described in detail. All of the patients questions were addressed to the patient's satisfaction.  Informational papers were given to the patient summarizing our discussion.  Parke Poisson desired to have a course of radiotherapy closer to home due to the 2[place]5 hour drive required to [place].  She would like to radiation to occur in [place], [place].    RECOMMENDATIONS:   Adjuvant radiation therapy to the right breast to decrease the risk of a local recurrence.  This will occur closer to home in [place].  The patient was given Dr. Tish Men name and contact information.   Adjuvant hormone therapy with anastrozole per Dr. Laroy Apple.   Continued surveillance imaging per routine.  New baseline right mammogram would occur approximately 2 months after completion of adjuvant radiation therapy.  For example this might be in may 2014.  Right breast would be imaged every 6 months for 2-3 years.  Annual mammogram of the left breast.    Patient was seen and examined with Dr. Bertha Stakes, MD on 11/08/2012 at 5:19 PM     I saw and evaluated the patient. I have reviewed and edited the resident's note and confirm the findings and plan of care as documented above.    Lovie Chol, MD

## 2012-11-08 ENCOUNTER — Ambulatory Visit: Payer: Self-pay | Admitting: Radiation Oncology

## 2012-11-08 ENCOUNTER — Telehealth: Payer: Self-pay

## 2012-11-08 ENCOUNTER — Encounter: Payer: Self-pay | Admitting: Radiation Oncology

## 2012-11-08 VITALS — BP 110/56 | HR 66 | Temp 97.7°F | Resp 18 | Ht 65.98 in | Wt 200.2 lb

## 2012-11-08 DIAGNOSIS — D0590 Unspecified type of carcinoma in situ of unspecified breast: Secondary | ICD-10-CM

## 2012-11-08 NOTE — Telephone Encounter (Signed)
Referral from Dr. Laroy Apple for nutrition consult; recommendations for Ca/vit D intake.  Left message for pt to call writer at [phone] to schedule appointment. Await call back.

## 2012-11-14 ENCOUNTER — Ambulatory Visit: Payer: Self-pay | Admitting: Oncology

## 2012-11-21 ENCOUNTER — Encounter: Payer: Self-pay | Admitting: Gastroenterology

## 2012-11-22 ENCOUNTER — Ambulatory Visit: Payer: Self-pay | Admitting: Radiation Oncology

## 2012-12-04 ENCOUNTER — Encounter: Payer: Self-pay | Admitting: Gastroenterology

## 2012-12-06 ENCOUNTER — Encounter: Payer: Self-pay | Admitting: Gastroenterology

## 2012-12-20 ENCOUNTER — Encounter: Payer: Self-pay | Admitting: Oncology

## 2012-12-26 ENCOUNTER — Telehealth: Payer: Self-pay | Admitting: Breast Surgery

## 2012-12-26 NOTE — Telephone Encounter (Signed)
As surgery was in December, she is not due for right diagnostic mammogram until June 2014. The date may need adjustment depending on when radiation.

## 2013-01-24 ENCOUNTER — Encounter: Payer: Self-pay | Admitting: Gastroenterology

## 2013-01-25 ENCOUNTER — Other Ambulatory Visit: Payer: Self-pay

## 2013-01-25 DIAGNOSIS — Z1231 Encounter for screening mammogram for malignant neoplasm of breast: Secondary | ICD-10-CM

## 2013-01-25 DIAGNOSIS — Z803 Family history of malignant neoplasm of breast: Secondary | ICD-10-CM

## 2013-01-30 ENCOUNTER — Ambulatory Visit: Payer: Self-pay | Admitting: Transplant

## 2013-01-30 VITALS — BP 140/59 | HR 82 | Temp 97.7°F | Resp 16 | Ht 65.98 in | Wt 202.4 lb

## 2013-01-30 DIAGNOSIS — D0590 Unspecified type of carcinoma in situ of unspecified breast: Secondary | ICD-10-CM

## 2013-01-30 DIAGNOSIS — C50919 Malignant neoplasm of unspecified site of unspecified female breast: Secondary | ICD-10-CM

## 2013-01-30 NOTE — Progress Notes (Signed)
Finished radiation 01-24-13 in [place] with Dr.Kara Byland.

## 2013-01-30 NOTE — Progress Notes (Signed)
Stacie Kim  [id number]    REFERRING PHYSICIAN: Delaney Meigs    HPI: Stacie Kim is a lovely 72 y.o. year old woman seen on 01/30/2013 for routine follow-up in the [facility]. She is currently 5 months S/P right  Partial Mastectomy and Re excision on 09/06/12 with no reconstruction for DCIS, right breast, ER/PR positive    Primary site: Breast (Right)    Staging method: AJCC 7th Edition    Clinical: Stage 0 (Tis, N0, cM0) signed by Zenda Alpers, MD on 08/29/2012  1:57 PM    Pathologic free text: solid papillary type    Pathologic: Stage 0 (Tis (DCIS), N0, cM0)    Summary: Stage 0 (Tis (DCIS), N0, cM0). Her oncologic history is as follows:       DCIS, right breast, ER/PR positive    07/20/2012 -  Other [place] biopsy ADH    08/17/2012 Surgery Right Site Select excisional biopsy     08/29/2012 Initial Diagnosis DCIS, right breast, ER/PR positive    09/06/2012 Surgery Right partial mastectomy  re excision of margins       She received Whole (std of care) breast radiation, completed on 01/25/12 in [place] by Dr Ulysees Barns. She has been on Arimidex 1 mg/day since tomorrow 01/31/13.      She is very anxious and has many questions but no complaints.   She rates her cosmetic satisfaction as very satisfied.      She is in good health and spirits and has no new symptoms referable to her breasts. She feels she tolerated radiation treatment well and has some skin darkening and mild to moderate discomfort.  Her biggest complaint is fatigue since stopping the radiation treatments, she also caught a virus.      Since her last visit with [place] she has developed insomnia but is reluctant to take medication for it.     Past medical and surgical history, social and family history: These were documented on the patient intake form and reviewed with the patient and notes no significant changes since her last visit with [place].     MEDICATIONS:   Current Outpatient Prescriptions   Medication Sig   . lisinopril  (PRINIVIL,ZESTRIL) 5 MG tablet Take 1 tablet (5 mg total) by mouth daily   . metoprolol (LOPRESSOR) 25 MG tablet Take 25 mg by mouth 2 times daily   . Multiple Vitamins-Minerals (MULTIVITAMIN PO) Take by mouth daily   . pantoprazole (PROTONIX) 20 MG EC tablet Take 20 mg by mouth daily   Swallow whole. Do not crush, break, or chew.   . anastrozole (ARIMIDEX) 1 MG tablet Take 1 tablet (1 mg total) by mouth daily     No current facility-administered medications for this visit.       ALLERGIES: is allergic to codeine and no known latex allergy.    REVIEW OF SYSTEMS: Comprehensive ROS was taken on the patient intake form and reviewed with the patient.  Pertinent items are noted in HPI.    PHYSICAL EXAMINATION:  Vitals: BP 140/59  Pulse 82  Temp(Src) 36.5 C (97.7 F) (Temporal)  Resp 16  Ht 167.6 cm (5' 5.98")  Wt 91.8 kg (202 lb 6.1 oz)  BMI 32.68 kg/m2  General appearance: alert, appears stated age and cooperative  Head: Normocephalic, without obvious abnormality, atraumatic  Eyes: conjunctivae/corneas clear. PERRL, EOM's intact.  Ears: External ears are normal, hearing is grossly intact.  Nose: Nares normal and patent.  Throat: lips, mucosa,  and tongue normal; teeth and gums normal  Neck: no adenopathy, no carotid bruit, no JVD, supple, symmetrical, trachea midline and thyroid not enlarged, symmetric, no tenderness/mass/nodules  Back: symmetric, no curvature. ROM normal. No CVA tenderness.  Lungs: clear to auscultation bilaterally  Heart: regular rate and rhythm, S1, S2 normal, no murmur, click, rub or gallop  Abdomen: Soft, nontender, nondistended, no organomegaly, no palpable masses.  Extremities: extremities normal, atraumatic, no cyanosis or edema  Pulses: 2+ and symmetric  Skin: Skin color, texture, turgor normal. No rashes or lesions  Lymph nodes: Cervical, supraclavicular, and axillary nodes normal.  Neurologic: Grossly normal  Chest: right breast smaller than left, incision line is well healed, she has  dark discoloration of the breast from radiation treatments, there are no lumps, she does have some tenderness, Left breast no lumps, dimpling, nipple discharge or pain.     LABS:  Vitamin D level was 51.6 on 12/04/12.    Imaging:   She was scheduled for her first right breast screening mammogram at her next appointment with DR Krebs on 05/24/13.  She will be due for left breast diagnostic mammogram at one year in 10/14. Followed by right breast screening mammogram every 6 months.     DEXA scan on 12/06/12 at [facility] in [place] shows a normal spine and neck osteopenia.  .    Pathology:   Pathology: Summary:  Ductal Carcinoma in situ (DCIS)  Size: spans approximately 2.5 cm  Grade = 2/3  Microinvasion: Not identified  ER: Positive, per outside report  PR: Positive, per outside report  Margins: Final margins:  Anterior: 1.5 mm  All other margins > 1 cm    PATHOLOGICAL STAGING: pTis Nx cMx          Impression: She is a 72 year old female status post right site select biopsy for atypical ductal hyperplasia on 08/17/12 pathology showed solid papillary ductal carcinoma in situ and she underwent right partial mastectomy on 09/06/12.  She completed radiation treatments locally and tolerated them well.  She complains of mild pain and darkening of the skin.     Plan:  1) Surveillance plan: Alicemae continues to be seen on an every 6 month basis with right mammogram every 6 months and left mammogram annually. She is scheduled to see Dr Laroy Apple in  July, Dr Basilia Jumbo  in October.  Her next  mammogram is due in July at [facility] and since she comes a distance it was scheduled at [facility] when she follows with Dr Laroy Apple.    2)Vitamin D: The most recent value was 51.6 on 12/08/12. Based on rather compelling epidemiologic data showing an association between low vitamin D levels and breast cancer risk, we recommend that levels be maintained at the mid to high end of the normal range (40-80).  She is presently not on a Vitamin D supplement but has neck  osteopenia and is to start Arimidex tomorrow (01/31/13).  I recommended that she purchase an over the counter calcium preparation with Vitamin D and take that daily.  We discussed foods high in calcium and she will add two daily. We will recheck her levels in 3 months.     She is worried about her 4 daughters risk for developing breast cancer.  She does not know her family history so wanted to have genetic testing.  We discussed this with DR Imogene Burn today and she was given reassurance since she developed cancer at 89 ( greater than 45-50) that a genetic genotype is  not likely.     For her insomnia we discussed relaxation before retiring and that she may try benadryl.

## 2013-01-30 NOTE — Patient Instructions (Addendum)
Please take calcium and vitamin D supplement.     7/31-Mammogram @ 11:30, Dr. Laroy Apple @ 1:45

## 2013-02-06 ENCOUNTER — Ambulatory Visit: Payer: Self-pay | Admitting: Breast Surgery

## 2013-02-06 ENCOUNTER — Ambulatory Visit: Payer: Self-pay | Admitting: Oncology

## 2013-02-27 ENCOUNTER — Ambulatory Visit: Payer: Self-pay | Admitting: Cardiology

## 2013-03-13 ENCOUNTER — Ambulatory Visit
Admit: 2013-03-13 | Discharge: 2013-03-13 | Disposition: A | Discharge status: Home / Self Care | Payer: Self-pay | Source: Ambulatory Visit | Attending: Cardiology | Admitting: Cardiology

## 2013-03-13 ENCOUNTER — Encounter: Payer: Self-pay | Admitting: Cardiology

## 2013-03-13 ENCOUNTER — Ambulatory Visit: Payer: Self-pay | Admitting: Cardiology

## 2013-03-13 ENCOUNTER — Other Ambulatory Visit: Payer: Self-pay | Admitting: Gastroenterology

## 2013-03-13 VITALS — BP 144/80 | HR 70 | Ht 66.0 in | Wt 199.0 lb

## 2013-03-13 DIAGNOSIS — R002 Palpitations: Secondary | ICD-10-CM

## 2013-03-13 DIAGNOSIS — I10 Essential (primary) hypertension: Secondary | ICD-10-CM | POA: Insufficient documentation

## 2013-03-13 DIAGNOSIS — R5383 Other fatigue: Secondary | ICD-10-CM

## 2013-03-13 DIAGNOSIS — I471 Supraventricular tachycardia: Secondary | ICD-10-CM | POA: Insufficient documentation

## 2013-03-13 MED ORDER — LISINOPRIL 5 MG PO TABS *I*
5.0000 mg | ORAL_TABLET | Freq: Every day | ORAL | Status: DC
Start: 2013-03-13 — End: 2013-10-08

## 2013-03-13 MED ORDER — METOPROLOL TARTRATE 25 MG PO TABS *I*
25.0000 mg | ORAL_TABLET | Freq: Two times a day (BID) | ORAL | Status: DC
Start: 2013-03-13 — End: 2013-10-08

## 2013-03-13 NOTE — Progress Notes (Signed)
Dear Dr. Willette Pa:     [name], a 72 y.o. Female presents for follow up of  Paroxysmal SVT.    HPI: Stacie Kim continues to do reasonably well.  As you know she underwent partial right mastectomy for ductal carcinoma, followed by a course of radiation therapy which she has just completed.  She does complain of fatigue at this point, she feels related to the radiation.  She also admits to a sedentary lifestyle this winter, and hopes to become more active physically and lose some weight this spring.  She describes occasional palpitations, but denies symptoms consistent with sustained tachycardia.  She denies chest pain with exertion.  She admits that she has been under quite a bit of stress, between her radiation therapy and as well as with some issues at her apartment complex.  She states that she will occasionally take an extra metoprolol tablet she feels that her palpitations are more bothersome, usually with good effect.    Past Medical History   Diagnosis Date   . Hypertension 01/01/2011   . Palpitations (Symptom) 01/01/2011     Nuc stress   . Breast cancer      Past Surgical History   Procedure Laterality Date   . Cholecystectomy  1967   . Hysterectomy  2006     Partial hystterectomy for uterine prolapse   . Tonsillectomy         ROS: 10 point review of systems otherwise negative except as per HPI.    Allergies: Codeine and No known latex allergy    Medications:  Patient's Medications   New Prescriptions    No medications on file   Previous Medications    ANASTROZOLE (ARIMIDEX) 1 MG TABLET    Take 1 tablet (1 mg total) by mouth daily    CALCIUM CARBONATE-VITAMIN D 600-400 MG-UNIT PER TABLET    Take 1 tablet by mouth daily    MULTIPLE VITAMINS-MINERALS (MULTIVITAMIN PO)    Take by mouth daily    PANTOPRAZOLE (PROTONIX) 20 MG EC TABLET    Take 20 mg by mouth daily   Swallow whole. Do not crush, break, or chew.    Modified Medications    Modified Medication Previous Medication    LISINOPRIL (PRINIVIL,ZESTRIL) 5 MG TABLET lisinopril (PRINIVIL,ZESTRIL) 5 MG tablet       Take 1 tablet (5 mg total) by mouth daily    Take 1 tablet (5 mg total) by mouth daily    METOPROLOL (LOPRESSOR) 25 MG TABLET metoprolol (LOPRESSOR) 25 MG tablet       Take 1 tablet (25 mg total) by mouth 2 times daily    Take 25 mg by mouth 2 times daily   Discontinued Medications    No medications on file        Vital Signs:  Blood pressure 144/80, pulse 70, height 1.676 m (5\' 6" ), weight 90.266 kg (199 lb).  Body mass index is 32.13 kg/(m^2).     GENERAL: Well appearing pleasant white female. NAD. Color good.  NECK: Trachea midline, no stridor. Carotid upstrokes normal with no bruits. JVP 6 cm.  RESPIRATORY: Normal bilateral breath sounds without wheezes, rales or rhonchi. No dullness to percussion.  COR: Normal precordium and PMI. Normal S1 and S2, regular with frequent ectopy. No murmur, rub, or gallops.  ABD: soft, nontender, nondistended, normoactive bowel sounds, no organomegaly.  VASCULAR: 2+ pulses at radial, dorsalis pedis, and posterior tibial pulses bilaterally. Capillary refill within 2 seconds. No signs of venous insufficiency.  EXTREMITIES: No cyanosis, clubbing, or edema.    Cholesterol Profile:  No results found for this basename: CHOL, HDL, LDLC, TRIG, [facility]      Lab Results   Component Value Date    NA 142 08/29/2012    K 4.7 08/29/2012    CL 104 08/29/2012    CO2 27 08/29/2012    UN 16 08/29/2012    GLU 85 08/29/2012     12 Lead ECG: Sinus rhythm 70 beats per minute.  Normal axis and intervals.    Reason for ECG:  palpitations.    Most recent cardiac testing: Nuclear stress test 08/31/12    CONCLUSION:   1. Mildly reduced aerobic capacity with no exercise induced  chest pain.  2. Negative stress ECG for ischemia at >100% of max predicted  heart rate. Mildly symptomatic supraventricular tachycardia with  exercise, with spontaneous termination in  recovery.  3. Normal left ventricular size and systolic function with no  regional wall motion abnormalities at rest or with stress.  4. Normal rest and stress myocardial perfusion with no  reversible defects.  5. Negative nuclear SPECT myocardial perfusion imaging study for  inducible myocardial ischemia at the cardiac work load achieved.      Assessment: Stacie Kim is a 72 y.o. Female with history of palpitations, paroxysmal SVT, hypertension and breast cancer status post recent partial mastectomy and adjuvant radiation therapy.  She remains clinically stable.  She endorses palpitations suggestive of intermittent ectopy, noted on exam today.  She describes exertional symptoms consistent with physical deconditioning, in the setting of a normal stress test back in November.    Plan:   1.  Palpitations, paroxysmal SVT- She will remain on metoprolol 25 mg twice a day, with the option to take additional tablets as needed for palpitations.  I told her that if she is quite frequently taking an extra dose, we may wish to increase her dose to 50 mg twice a day.    2.  Hypertension- Her blood pressure control seems reasonable.  I did not recommend any changes.  I encouraged her to increase her exercise as planned, as I would anticipate this will help to improve her blood pressures as well as her symptoms and fatigue.    I will see her in followup in 6 months, sooner if needed.    Thank you for the opportunity of sharing in the care of this patient.  We look forward to working with you in the future.    Honor Junes, MD  [facility]

## 2013-03-20 ENCOUNTER — Ambulatory Visit: Payer: BC Managed Care – PPO

## 2013-03-20 LAB — EKG 12-LEAD
P: 48 degrees
QRS: 8 degrees
Rate: 70 {beats}/min
Severity: NORMAL
Severity: NORMAL
T: 43 degrees

## 2013-05-24 ENCOUNTER — Ambulatory Visit: Payer: Self-pay | Admitting: Oncology

## 2013-05-24 ENCOUNTER — Encounter: Payer: Self-pay | Admitting: Oncology

## 2013-05-24 ENCOUNTER — Ambulatory Visit
Admit: 2013-05-24 | Discharge: 2013-05-24 | Disposition: A | Discharge status: Home / Self Care | Payer: Self-pay | Source: Ambulatory Visit | Attending: Family Medicine | Admitting: Family Medicine

## 2013-05-24 VITALS — BP 137/81 | HR 78 | Wt 199.5 lb

## 2013-05-24 DIAGNOSIS — D0590 Unspecified type of carcinoma in situ of unspecified breast: Secondary | ICD-10-CM

## 2013-05-24 NOTE — Patient Instructions (Addendum)
11/26/12 at 11:30am Right Mammo at [facility].

## 2013-05-24 NOTE — Progress Notes (Signed)
Patient here for 6 month follow up. Tolerating Anastrozole well so far, began beginning of June.

## 2013-05-24 NOTE — Progress Notes (Signed)
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  female that presents today for a  6 month visit.    INTERVAL HISTORY:      Today, she is without breast-related complaints, specifically breast lumps, pain, or skin changes.  She tolerated radiation therapy well.  She is satisfied with her cosmesis.   She  is without axillary pain, swelling, or lymphedema.     She began on anastrozole following radiation therapy.  She denies any significant side effects.   She is without complaint today.  She states that she feels well.    REVIEW OF SYSTEMS:     Review of Systems   All other systems reviewed and are negative.      PAST MEDICAL/SURGICAL/SOCIAL/FAMILY HISTORY:     The pertinent past medical, social, family history were reviewed and updated as appropriate in the EMR.    CURRENT MEDICATIONS:     Medications and allergies reviewed and updated as appropriate in the EMR.  Current Outpatient Prescriptions   Medication Sig   . calcium carbonate-vitamin D 600-400 MG-UNIT per tablet Take 1 tablet by mouth daily   . metoprolol (LOPRESSOR) 25 MG tablet Take 1 tablet (25 mg total) by mouth 2 times daily   . lisinopril (PRINIVIL,ZESTRIL) 5 MG tablet Take 1 tablet (5 mg total) by mouth daily   . anastrozole (ARIMIDEX) 1 MG tablet Take 1 tablet (1 mg total) by mouth daily   . Multiple Vitamins-Minerals (MULTIVITAMIN PO) Take by mouth daily   . pantoprazole (PROTONIX) 20 MG EC tablet Take 20 mg by mouth daily   Swallow whole. Do not crush, break, or  chew.     PHYSICAL EXAM:      BP 137/81  Pulse 78  Wt 90.5 kg (199 lb 8.3 oz)  BMI 32.22 kg/m2  ECOG: 0     Current pain score: 1 (Right breast - following a MMG today)  Physical Exam   Vitals reviewed.  Constitutional: She is oriented to person, place, and time. She appears well-developed and well-nourished.   Eyes: No scleral icterus.   Neck:   No cervical or supraclavicular lymphadenopathy.   Cardiovascular: Normal rate and regular rhythm.    No murmur heard.  Pulmonary/Chest: Effort normal and breath sounds normal. No respiratory distress.   BREAST: S/P right partial mastectomy.  The incisions are well-healed.  She has mild post radiation skin changes with erythema.  Minimal induration surrounding the lumpectomy scar in the right breast upper outer quadrant.  No suspicious masses, skin changes, nipple discharge, or axillary lymphadenopathy bilaterally.    Abdominal: Soft. She exhibits no distension.   Musculoskeletal: She exhibits no edema.   Neurological: She is alert and oriented to person, place, and time.   Skin: No erythema.   Psychiatric: She has a normal mood and affect. Her behavior is normal.  LABORATORY:   No recent labs for review.    RADIOLOGY:     LAST MMG    Bilateral ([facility]) 05/24/13 - post treatment related changes to right breast   Right - due in 6 months  LAST DXA  12/06/12 [facility]) - normal; T score 0.5    IMPRESSION:      Right Breast DCIS   ER (+) - On anastrozole.  Tolerating well.   S/P Right Lumpectomy and XRT   H/O vitamin D insufficiency    PLAN:      I discussed the risk of local recurrence and the higher risk for ipsilateral or contralateral breast cancers.  I reviewed the role of hormone therapy for risk reduction.  Continue on anastrozole.  Planned duration 5 years.   Arrange for 6 mos Right DX MMG  At her ROV in 6 months.    Repeat DXA scan in 2 years   Discussed the role of Vit D for bone health.      FOLLOW UP:      She will follow up in 6 month(s).  No  labs are required PRIOR TO NEXT VISIT.     [name]  knows to call in the interim should questions or problems develop.     Brynda Greathouse, MD  Medical Oncology    Dictated using Dragon voice recognition software.  To expedite communication some inadvertent typographical and transcription errors generated by the transcription software may have been missed despite a reasonable effort to identify and correct them.

## 2013-07-31 ENCOUNTER — Ambulatory Visit: Payer: Self-pay | Admitting: Breast Surgery

## 2013-08-27 ENCOUNTER — Ambulatory Visit: Payer: Self-pay | Admitting: Breast Surgery

## 2013-09-17 ENCOUNTER — Ambulatory Visit: Payer: Self-pay | Admitting: Cardiology

## 2013-10-08 ENCOUNTER — Other Ambulatory Visit: Payer: Self-pay | Admitting: Cardiology

## 2013-10-08 MED ORDER — LISINOPRIL 5 MG PO TABS *I*
5.0000 mg | ORAL_TABLET | Freq: Every day | ORAL | Status: DC
Start: 2013-10-08 — End: 2014-04-19

## 2013-10-08 MED ORDER — METOPROLOL TARTRATE 25 MG PO TABS *I*
25.0000 mg | ORAL_TABLET | Freq: Two times a day (BID) | ORAL | Status: DC
Start: 2013-10-08 — End: 2014-04-19

## 2013-10-30 ENCOUNTER — Ambulatory Visit: Payer: Self-pay | Admitting: Cardiology

## 2013-10-30 ENCOUNTER — Ambulatory Visit: Payer: Self-pay | Admitting: Breast Surgery

## 2013-10-30 ENCOUNTER — Other Ambulatory Visit: Payer: Self-pay | Admitting: Oncology

## 2013-10-30 DIAGNOSIS — R928 Other abnormal and inconclusive findings on diagnostic imaging of breast: Secondary | ICD-10-CM

## 2013-11-26 ENCOUNTER — Ambulatory Visit: Payer: Self-pay | Admitting: Oncology

## 2013-11-26 ENCOUNTER — Ambulatory Visit: Payer: Self-pay | Admitting: Breast Surgery

## 2013-12-31 ENCOUNTER — Other Ambulatory Visit: Payer: Self-pay | Admitting: Oncology

## 2013-12-31 ENCOUNTER — Ambulatory Visit: Payer: Self-pay | Admitting: Breast Surgery

## 2013-12-31 ENCOUNTER — Ambulatory Visit
Admit: 2013-12-31 | Discharge: 2013-12-31 | Disposition: A | Discharge status: Home / Self Care | Payer: Self-pay | Source: Ambulatory Visit | Attending: Family Medicine | Admitting: Family Medicine

## 2013-12-31 ENCOUNTER — Ambulatory Visit: Payer: Self-pay | Admitting: Oncology

## 2013-12-31 VITALS — BP 144/68 | HR 68 | Temp 97.2°F | Resp 18 | Ht 65.98 in | Wt 209.2 lb

## 2013-12-31 DIAGNOSIS — R928 Other abnormal and inconclusive findings on diagnostic imaging of breast: Secondary | ICD-10-CM

## 2013-12-31 DIAGNOSIS — D0591 Unspecified type of carcinoma in situ of right breast: Secondary | ICD-10-CM

## 2013-12-31 MED ORDER — ANASTROZOLE 1 MG PO TABS *I*
1.0000 mg | ORAL_TABLET | Freq: Every day | ORAL | Status: AC
Start: 2013-12-31 — End: ?

## 2013-12-31 NOTE — Progress Notes (Addendum)
Subjective:       Stacie Kim presents at [place] at [facility] 16 months following right skin sparing and nipple sparing Partial Mastectomy without reconstruction. She has recovered well from a surgical standpoint and has resumed her daily activities. She is requesting consultation with a Plastics & Reconstructive Surgery for evaluation of balancing reduction. She is otherwise in her normal state of health and without any complaints.     Mammogram 12/31/13:  Today's imaging shows no evidence of malignancy. BI-RADS Category 2: Benign Finding.    Objective:       Vitals: BP 144/68    Pulse 68    Temp(Src) 36.2 C (97.2 F) (Temporal)    Resp 18    Ht 167.6 cm (5' 5.98")    Wt 94.9 kg (209 lb 3.5 oz)    BMI 33.78 kg/m2       General:  alert, appears stated age and cooperative   Axillary Incision: None. No axillary lymphadenopathy   Breast Incision: Well healed incision   Chest: CTA  CV: RRR    Pathology:  09/06/12: Right partial mastectomy  Ductal Carcinoma in situ (DCIS)  Size: spans approximately 2.5 cm  Grade = 2/3  ER: Positive  PR: Positive  Margins: Final margins:  Anterior: 1.5 mm  All other margins > 1 cm    09/28/12: Re-excision biopsy  Breast, right, new anterior margin, re-excision:  - Prior biopsy site change present.  - Proliferative fibrocystic change present, including adenosis,  ductal hyperplasia without atypia, cyst formation  and apocrine metaplasia.  - No residual carcinoma or malignancy identified.      Assessment:   Stacie Kim is a 73 yo female s/p right partial mastectomy with re-excision on 09/08/12 and 09/28/12, respectively, who presents today for routine follow-up.     Plan:     1. Continue any current medications.  2. Pt referred to Dr. Antonietta Breach for balancing procedure  3. Return in  6 months for ongoing followup.  4. Due for her screening left mammogram in July 2015.    Lianne Moris, MD      I saw and evaluated the patient. I agree with the resident's/fellow's findings and  plan of care as documented above.    Ollen Gross, MD

## 2014-01-23 ENCOUNTER — Ambulatory Visit: Payer: Self-pay | Admitting: Surgery

## 2014-03-28 ENCOUNTER — Ambulatory Visit: Payer: Self-pay | Admitting: Oncology

## 2014-04-10 ENCOUNTER — Ambulatory Visit: Payer: Self-pay | Admitting: Surgery

## 2014-04-19 ENCOUNTER — Other Ambulatory Visit: Payer: Self-pay | Admitting: Cardiology

## 2014-04-19 DIAGNOSIS — I10 Essential (primary) hypertension: Secondary | ICD-10-CM

## 2014-05-06 ENCOUNTER — Encounter: Payer: Self-pay | Admitting: Gastroenterology

## 2014-05-06 ENCOUNTER — Other Ambulatory Visit: Payer: Self-pay

## 2014-05-06 NOTE — Telephone Encounter (Signed)
Received 12 pages - urgent

## 2014-05-08 NOTE — Telephone Encounter (Signed)
Raynelle Longfellow from Dr. Marcelyn Bruins office is calling to schedule a video capsule study for the patient. Received referral on 7/13, marked urgent. Raynelle Hanceville states she can be reached at [phone] tp schedule or we can contact the patient at [phone] (H)

## 2014-05-08 NOTE — Telephone Encounter (Signed)
Sent to [place] to review prior to scheduling

## 2014-05-09 NOTE — Telephone Encounter (Signed)
Per Purvis Kilts- scheduled for VCE.  Patient is scheduled for SBI on 05/30/14.  She was given the number to guest services at [phone].  Prep instructions mailed 05/09/2014.

## 2014-05-10 MED ORDER — PEG 3350-KCL-NABCB-NACL-NASULF 236 GM PO SOLR *I*
ORAL | Status: DC
Start: 2014-05-09 — End: 2014-06-03

## 2014-05-17 ENCOUNTER — Telehealth: Payer: Self-pay

## 2014-05-17 NOTE — Telephone Encounter (Signed)
Patient called and wanted to know if she could get her SBI procedure switched to either August 10 or 26 since her daughter will be in [place] on those days. Please call her back at [phone].

## 2014-05-17 NOTE — Telephone Encounter (Signed)
Message left for patient to return my call re: 06/03/14.  SBI rescheduled to patency per S.K.

## 2014-05-21 ENCOUNTER — Other Ambulatory Visit: Payer: Self-pay

## 2014-05-30 ENCOUNTER — Other Ambulatory Visit: Payer: Self-pay

## 2014-06-03 ENCOUNTER — Other Ambulatory Visit: Payer: Self-pay | Admitting: Physician Assistant

## 2014-06-03 ENCOUNTER — Ambulatory Visit
Admit: 2014-06-03 | Discharge: 2014-06-03 | Disposition: A | Discharge status: Home / Self Care | Payer: Self-pay | Attending: Gastroenterology | Admitting: Gastroenterology

## 2014-06-03 ENCOUNTER — Other Ambulatory Visit: Payer: Self-pay | Admitting: Internal Medicine

## 2014-06-03 DIAGNOSIS — R1084 Generalized abdominal pain: Secondary | ICD-10-CM

## 2014-06-03 DIAGNOSIS — K222 Esophageal obstruction: Secondary | ICD-10-CM

## 2014-06-03 MED ORDER — PEG 3350-KCL-NABCB-NACL-NASULF 236 GM PO SOLR *I*
ORAL | Status: DC
Start: 2014-06-03 — End: 2014-06-19

## 2014-06-03 NOTE — Progress Notes (Signed)
Patient arrived for patency capsule.  She had both patency and capsule endoscopy preps with her and had cleaned out using the Golytely.  She will need another Rx for the Golytely sent to her pharmacy for the capsule endoscopy.  Patient felt good today with no complaints.  She lives in [place] and will get the [place] done at [facility] - called [facility] [phone] spoke with Lattie Haw, and confirmed that patient will bring requisitions and that they will need to fax [place] the information from the KUB results.  Discharge instructions given to patient along with GI number in the event any untoward events are experienced.  Answered all questions, spoke with patient's daughter via phone regarding instructions.  Lot #  [id number] Exp 2016-03.  Patient swallowed pill at 10:10 AM with no difficulty.

## 2014-06-04 NOTE — Telephone Encounter (Signed)
Called Gearlean Alf, at:  [phone] spoke with Randell Patient, she stated that the capsule had passed and she will fax over the information to [place] at [phone].  Called patient, as she wanted to know results of KUB, left message with patient that capsule has passed and no need for another x-ray tomorrow.  She has our number should she have any further concerns.

## 2014-06-04 NOTE — Telephone Encounter (Signed)
Patient is calling for the results of her xray done at [facility].  Patient needs to find out if the capsule passed so she know if another xray is needed.  Please call her to advise at [phone].

## 2014-06-05 NOTE — Telephone Encounter (Signed)
Received KUB from 06/04/2014 which was normal, capsul passed. Proceed with capsul endoscopy this week per Dr. Silvana Newness. Message sent to secretary to schedule. Shellia Carwin, [place]

## 2014-06-05 NOTE — Telephone Encounter (Signed)
I called patient and arranged SBI for 06/07/14.    Patient needs to speak with daughter due to transportation and will call back on Thursday to confirm or reschedule.

## 2014-06-06 NOTE — Telephone Encounter (Signed)
Patient returned a missed call. She stated she will not be able to come on 8/14 for the SBI but would like to reschedule. Please call her back at [phone].

## 2014-06-07 ENCOUNTER — Other Ambulatory Visit: Payer: Self-pay

## 2014-06-18 ENCOUNTER — Inpatient Hospital Stay: Admit: 2014-06-18 | Payer: Self-pay

## 2014-06-18 ENCOUNTER — Other Ambulatory Visit: Payer: Self-pay

## 2014-06-18 ENCOUNTER — Telehealth: Payer: Self-pay | Admitting: Gastroenterology

## 2014-06-18 NOTE — Telephone Encounter (Signed)
Stacie Kim was scheduled for a SBI @ 7:20am.  She called to cancel because she states that she got sick on the way here, so she went back home.  She states that she will back next week to reschedule after she talks to her daughter.

## 2014-06-19 ENCOUNTER — Other Ambulatory Visit: Payer: Self-pay | Admitting: Gastroenterology

## 2014-06-19 MED ORDER — PEG 3350-KCL-NABCB-NACL-NASULF 236 GM PO SOLR *I*
ORAL | Status: AC
Start: 2014-06-19 — End: ?

## 2014-06-19 NOTE — Telephone Encounter (Signed)
Rescheduled with patient, sent Golytely.

## 2014-06-19 NOTE — Telephone Encounter (Signed)
Stacie Kim has called to reschedule the SBI originally scheduled for 8.25. Please contact [place] to reschedule at [phone]

## 2014-06-19 NOTE — Telephone Encounter (Signed)
Patient returning call to schedule SBI.

## 2014-06-19 NOTE — Telephone Encounter (Signed)
Called pt and left message

## 2014-06-21 ENCOUNTER — Other Ambulatory Visit: Payer: Self-pay | Admitting: Cardiology

## 2014-06-21 NOTE — Telephone Encounter (Signed)
Spoke with patient.  She has transferred her care to a local Physician, Dr. Meda Coffee at [facility].  I will advise pharmacy.

## 2014-07-08 ENCOUNTER — Ambulatory Visit: Payer: Self-pay | Admitting: Breast Surgery

## 2014-07-08 ENCOUNTER — Ambulatory Visit
Admit: 2014-07-08 | Discharge: 2014-07-08 | Disposition: A | Discharge status: Home / Self Care | Payer: Self-pay | Attending: Gastroenterology | Admitting: Gastroenterology

## 2014-07-08 ENCOUNTER — Telehealth: Payer: Self-pay | Admitting: Breast Surgery

## 2014-07-08 NOTE — Progress Notes (Signed)
7:45am  patient arrived at the [facility] clinic, accompanied by daughter for the administration of a small bowel capsule. The informed consent was obtained by Deeann Cree PA, signed/dated by the patient and will be scanned into the chart.  The procedure was explained in full and all questions were answered. The equipment was applied and swallowed the capsule at 8:15 am without incident. Patient was instructed to return to the clinic by 4:00 pm for removal of the equipment. Written discharge instructions were provided along with the phone number for the GI department should she experience any untoward effects. SBI capsule lot # [id number] Exp. 07/2015    Capsule endoscopy event form and during capsule endoscopy instructions reviewed and provided to patient.

## 2014-07-09 ENCOUNTER — Ambulatory Visit: Payer: Self-pay | Admitting: Transplant

## 2014-07-10 ENCOUNTER — Encounter: Payer: Self-pay | Admitting: Gastroenterology

## 2014-07-10 ENCOUNTER — Ambulatory Visit
Admit: 2014-07-10 | Discharge: 2014-07-10 | Disposition: A | Discharge status: Home / Self Care | Payer: Self-pay | Attending: Gastroenterology | Admitting: Gastroenterology

## 2014-07-10 HISTORY — PX: SMALL BOWEL CAPSULE ENDO: 103426110

## 2014-07-10 NOTE — Procedures (Addendum)
CAPSULE ENDOSCOPY   PROCEDURE NOTE      PATIENT: Stacie Kim  MRN: [id number]  DOB: 1941-03-30  DATE OF PROCEDURE: 07/10/2014   REFERRING PROCEDURE: Judye Bos  PRIMARY PHYSICIAN: Unknown, Provider (General)        ATTENDING PHYSICIAN: Sheppard Coil, M.D.  FELLOW:  Stephanie Coup, MD    PREPROCEDURE DIAGNOSIS:  Rule out IBD    REASON FOR REFERRAL: 73 year old woman with abdominal pain, diarrhea, normal colonoscopy, abnormal small bowel follow through. Rule out IBD.     POSTPROCEDURE DIAGNOSIS: Please see impression below.     TITLE OF PROCEDURE: Wireless capsule endoscopy.    INSTRUMENTS: Given brand capsule enteroscope.     DESCRIPTION OF PROCEDURE:   The purpose of the procedure, alternative and potential complications including, but not limited to,  retained capsule and bowel obstruction necessitating surgery were explained and informed consent was obtained from the patient. The patient was subsequently fitted with a data-recording device. The patient was  then observed to swallow the capsule. Approximately 8 hours later, the patient was reevaluated and the images were downloaded from the data records to our computer. These images were then viewed and examined in the form of a continuous video.     FINDINGS DURING THE PROCEDURE:  The first gastric image was noted at 00:00:30. The first duodenal image was noted at 00:36:16. The first ileocecal image was noted at 05:01:49 and the first cecal image was noted at 05:02:08. The gastric passage time was 40minutes. The small bowel passage time was 4 hours 25 minutes. The duodenum, jejunum and ileum were examined in detail and the following findings were noted. The capsule was noted to pass into the cecum and then pass through significant portions of the colon.     00:37:27 In the early duodenum, we encountered a flat lesion which was erythematous with abnormal borders, concerning for an infiltrating lesion.   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     There was no evidence to suggest either celiac disease or inflammatory bowel disease.     IMPRESSION:  In the early duodenum, likely D2, was a flat erythematous lesion with irregular borders. This is concerning for an infiltrating lesion.  In the mid small bowel we encountered one area of lymphangiectasia and a small diverticulum.  There was no evidence to suggest either celiac disease or inflammatory bowel disease.  No evidence for small bowel source of anemia found.     RECOMMENDATIONS:  EGD with biopsy of abnormal lesion, likely in D2 portion of duodenum    FOLLOW UP:  With Dr. Breck Coons in clinic    Stephanie Coup M.D. PGY5  Fellow, Department of Gastroenterology and Hepatology    The capsule and findings were reviewed with Dr. Purvis Kilts who agrees with report and recommendations as noted above.     GI ATTENDING    I was present for the entire viewing portion of the exam and participated as needed throughout the procedure, and I concur with the findings and intervention descriptions as outlined in this edited note.    Sheppard Coil, MD

## 2014-07-12 ENCOUNTER — Telehealth: Payer: Self-pay | Admitting: Gastroenterology

## 2014-07-12 NOTE — Telephone Encounter (Signed)
Spoke with patient regarding her VCE results, and she will call Dr. Breck Coons for close follow up. Dr, Marcelyn Bruins office has confirmed that they received the VCE results.    Stephanie Coup, MD

## 2014-07-26 ENCOUNTER — Encounter: Payer: Self-pay | Admitting: Oncology

## 2014-07-30 ENCOUNTER — Telehealth: Payer: Self-pay | Admitting: Oncology

## 2014-07-30 NOTE — Telephone Encounter (Signed)
Noted-Stacie Kim will follow up with patient in a couple weeks in regards to rescheduling appointment.

## 2014-07-30 NOTE — Telephone Encounter (Signed)
Stacie Kim would not reschedule with me until she gets results from her biopsy next week.

## 2014-07-30 NOTE — Telephone Encounter (Signed)
Called patient to follow up as we got a notice that she had missed her mammogram in September-bilateral. She states she has been going through a lot, she may have cancer in her small intestine, is being worked up for that. She has been meaning to reschedule her mammogram, she wants to find somewhere closer to home [facility]) as it is difficult her for her travel right now. She is going to [employer] for another follow up, where she got her radiation treatments, and will check then where she can get a mammogram. She will call us and let us know where and when she is having it done, and if they need a requisition from [place].   She is feeling well on the Anastrozole, no problems except for some hair thinning. She saw Dr. Lynelle Smoke in March. I let her know that Dr. Vincenza Hews would like her to come in to see her our out NP within the next few months. Patient is agreeable to this, and then states she wants to find an oncologist more local. I will have the front office work on getting her an appointment. Patient/caregiver verbalized understanding. There are no further questions or concerns at this time.

## 2014-08-20 NOTE — Telephone Encounter (Signed)
Left a voicemail on Faatima's phone to call me back to get her on MK's schedule in January

## 2014-08-26 NOTE — Telephone Encounter (Signed)
Attempted to reach Stacie Kim 08/26/2014.    Is there another form of communication you would like me to try? I was unable to reach her multiple times over the phone. Is there a letter I can send in this situation?

## 2014-08-27 NOTE — Telephone Encounter (Signed)
I would say, yes, if you are unable to get in touch with her, you can send her a letter reminding her to schedule her mammogram, and advising her to call the office to schedule a follow up appointment for January, or advise [place] if she is seeing a different oncologist more local.

## 2014-08-30 ENCOUNTER — Encounter: Payer: Self-pay | Admitting: Oncology

## 2014-08-30 NOTE — Telephone Encounter (Signed)
Would you try to reach the patient 1 more time and see what is going on with the biopsy issue she was referring to in Oct and if she wants help finding another Oncologist.  She is also overdue for her MMG.   If she does not call back or answer, then we should generate a letter in [facility] to send to her about not being able to reach her.

## 2014-09-03 NOTE — Telephone Encounter (Signed)
Attempted to reach patient again. Left her a voicemail inquiring about biopsy and need for mammogram. Asked her to call back to discuss, and if she needs help with finding a new oncologist in her area or setting up mammogram, we can help her. I would advise generating a letter in [facility] about not being able to reach her.

## 2014-09-06 ENCOUNTER — Encounter: Payer: Self-pay | Admitting: Oncology

## 2014-09-06 NOTE — Telephone Encounter (Signed)
Letter mailed to patient.

## 2015-02-06 DIAGNOSIS — R21 Rash and other nonspecific skin eruption: Secondary | ICD-10-CM | POA: Diagnosis not present

## 2015-02-12 DIAGNOSIS — R21 Rash and other nonspecific skin eruption: Secondary | ICD-10-CM | POA: Diagnosis not present

## 2015-02-21 DIAGNOSIS — R21 Rash and other nonspecific skin eruption: Secondary | ICD-10-CM | POA: Diagnosis not present

## 2015-05-21 DIAGNOSIS — Z8639 Personal history of other endocrine, nutritional and metabolic disease: Secondary | ICD-10-CM | POA: Diagnosis not present

## 2015-05-21 DIAGNOSIS — Z13220 Encounter for screening for lipoid disorders: Secondary | ICD-10-CM | POA: Diagnosis not present

## 2015-05-21 DIAGNOSIS — R5383 Other fatigue: Secondary | ICD-10-CM | POA: Diagnosis not present

## 2015-05-21 DIAGNOSIS — I10 Essential (primary) hypertension: Secondary | ICD-10-CM | POA: Diagnosis not present

## 2015-07-09 ENCOUNTER — Other Ambulatory Visit: Payer: Self-pay | Admitting: Gastroenterology

## 2015-12-29 DIAGNOSIS — A084 Viral intestinal infection, unspecified: Secondary | ICD-10-CM | POA: Diagnosis not present

## 2016-03-25 DIAGNOSIS — L0390 Cellulitis, unspecified: Secondary | ICD-10-CM

## 2016-03-25 DIAGNOSIS — R600 Localized edema: Secondary | ICD-10-CM

## 2016-03-25 DIAGNOSIS — W5503XA Scratched by cat, initial encounter: Secondary | ICD-10-CM

## 2016-03-25 DIAGNOSIS — S80812A Abrasion, left lower leg, initial encounter: Secondary | ICD-10-CM

## 2016-03-25 HISTORY — DX: Cellulitis, unspecified: L03.90

## 2016-03-25 HISTORY — DX: Scratched by cat, initial encounter: W55.03XA

## 2016-03-25 HISTORY — DX: Localized edema: R60.0

## 2016-03-25 HISTORY — DX: Abrasion, left lower leg, initial encounter: S80.812A

## 2016-04-14 ENCOUNTER — Other Ambulatory Visit: Payer: Self-pay

## 2016-04-14 ENCOUNTER — Encounter (HOSPITAL_COMMUNITY): Payer: Self-pay | Admitting: Vascular Surgery

## 2016-04-14 ENCOUNTER — Emergency Department (HOSPITAL_COMMUNITY): Payer: 59

## 2016-04-14 ENCOUNTER — Inpatient Hospital Stay (HOSPITAL_COMMUNITY)
Admission: EM | Admit: 2016-04-14 | Discharge: 2016-04-21 | DRG: 871 | Disposition: A | Discharge status: Home / Self Care | Payer: 59 | Attending: Internal Medicine | Admitting: Internal Medicine

## 2016-04-14 DIAGNOSIS — Z85828 Personal history of other malignant neoplasm of skin: Secondary | ICD-10-CM | POA: Diagnosis not present

## 2016-04-14 DIAGNOSIS — Z87891 Personal history of nicotine dependence: Secondary | ICD-10-CM | POA: Diagnosis not present

## 2016-04-14 DIAGNOSIS — Y92013 Bedroom of single-family (private) house as the place of occurrence of the external cause: Secondary | ICD-10-CM | POA: Diagnosis not present

## 2016-04-14 DIAGNOSIS — L02416 Cutaneous abscess of left lower limb: Secondary | ICD-10-CM | POA: Diagnosis present

## 2016-04-14 DIAGNOSIS — E876 Hypokalemia: Secondary | ICD-10-CM | POA: Diagnosis not present

## 2016-04-14 DIAGNOSIS — L0390 Cellulitis, unspecified: Secondary | ICD-10-CM | POA: Diagnosis not present

## 2016-04-14 DIAGNOSIS — L03119 Cellulitis of unspecified part of limb: Secondary | ICD-10-CM | POA: Insufficient documentation

## 2016-04-14 DIAGNOSIS — R197 Diarrhea, unspecified: Secondary | ICD-10-CM | POA: Diagnosis not present

## 2016-04-14 DIAGNOSIS — I480 Paroxysmal atrial fibrillation: Secondary | ICD-10-CM | POA: Diagnosis not present

## 2016-04-14 DIAGNOSIS — R930 Abnormal findings on diagnostic imaging of skull and head, not elsewhere classified: Secondary | ICD-10-CM | POA: Diagnosis not present

## 2016-04-14 DIAGNOSIS — E785 Hyperlipidemia, unspecified: Secondary | ICD-10-CM | POA: Diagnosis present

## 2016-04-14 DIAGNOSIS — I517 Cardiomegaly: Secondary | ICD-10-CM | POA: Diagnosis not present

## 2016-04-14 DIAGNOSIS — Z880 Allergy status to penicillin: Secondary | ICD-10-CM

## 2016-04-14 DIAGNOSIS — W06XXXA Fall from bed, initial encounter: Secondary | ICD-10-CM | POA: Diagnosis present

## 2016-04-14 DIAGNOSIS — L02419 Cutaneous abscess of limb, unspecified: Secondary | ICD-10-CM | POA: Insufficient documentation

## 2016-04-14 DIAGNOSIS — B955 Unspecified streptococcus as the cause of diseases classified elsewhere: Secondary | ICD-10-CM | POA: Diagnosis not present

## 2016-04-14 DIAGNOSIS — R531 Weakness: Secondary | ICD-10-CM | POA: Diagnosis not present

## 2016-04-14 DIAGNOSIS — A419 Sepsis, unspecified organism: Principal | ICD-10-CM | POA: Diagnosis present

## 2016-04-14 DIAGNOSIS — R590 Localized enlarged lymph nodes: Secondary | ICD-10-CM

## 2016-04-14 DIAGNOSIS — E119 Type 2 diabetes mellitus without complications: Secondary | ICD-10-CM

## 2016-04-14 DIAGNOSIS — Z833 Family history of diabetes mellitus: Secondary | ICD-10-CM

## 2016-04-14 DIAGNOSIS — I4891 Unspecified atrial fibrillation: Secondary | ICD-10-CM | POA: Diagnosis not present

## 2016-04-14 DIAGNOSIS — L03116 Cellulitis of left lower limb: Secondary | ICD-10-CM

## 2016-04-14 DIAGNOSIS — B954 Other streptococcus as the cause of diseases classified elsewhere: Secondary | ICD-10-CM | POA: Diagnosis not present

## 2016-04-14 DIAGNOSIS — E1165 Type 2 diabetes mellitus with hyperglycemia: Secondary | ICD-10-CM | POA: Diagnosis not present

## 2016-04-14 DIAGNOSIS — I361 Nonrheumatic tricuspid (valve) insufficiency: Secondary | ICD-10-CM | POA: Diagnosis not present

## 2016-04-14 DIAGNOSIS — Z8572 Personal history of non-Hodgkin lymphomas: Secondary | ICD-10-CM

## 2016-04-14 DIAGNOSIS — E1310 Other specified diabetes mellitus with ketoacidosis without coma: Secondary | ICD-10-CM | POA: Diagnosis present

## 2016-04-14 DIAGNOSIS — K760 Fatty (change of) liver, not elsewhere classified: Secondary | ICD-10-CM | POA: Diagnosis not present

## 2016-04-14 DIAGNOSIS — Y939 Activity, unspecified: Secondary | ICD-10-CM

## 2016-04-14 DIAGNOSIS — W5503XA Scratched by cat, initial encounter: Secondary | ICD-10-CM

## 2016-04-14 DIAGNOSIS — R600 Localized edema: Secondary | ICD-10-CM | POA: Diagnosis not present

## 2016-04-14 DIAGNOSIS — R404 Transient alteration of awareness: Secondary | ICD-10-CM | POA: Diagnosis not present

## 2016-04-14 DIAGNOSIS — R7881 Bacteremia: Secondary | ICD-10-CM

## 2016-04-14 HISTORY — DX: Scratched by cat, initial encounter: W55.03XA

## 2016-04-14 HISTORY — DX: Bursopathy, unspecified: M71.9

## 2016-04-14 HISTORY — DX: Hyperlipidemia, unspecified: E78.5

## 2016-04-14 HISTORY — DX: Abrasion, left lower leg, initial encounter: S80.812A

## 2016-04-14 HISTORY — DX: Cellulitis, unspecified: L03.90

## 2016-04-14 HISTORY — DX: Localized edema: R60.0

## 2016-04-14 LAB — CBC WITH DIFFERENTIAL/PLATELET
BASOS ABS: 0 10*3/uL (ref 0.0–0.1)
BASOS PCT: 0 %
EOS PCT: 0 %
Eosinophils Absolute: 0 10*3/uL (ref 0.0–0.7)
HEMATOCRIT: 42.1 % (ref 36.0–46.0)
Hemoglobin: 14.2 g/dL (ref 12.0–15.0)
Lymphocytes Relative: 3 %
Lymphs Abs: 0.4 10*3/uL — ABNORMAL LOW (ref 0.7–4.0)
MCH: 30 pg (ref 26.0–34.0)
MCHC: 33.7 g/dL (ref 30.0–36.0)
MCV: 89 fL (ref 78.0–100.0)
MONO ABS: 1 10*3/uL (ref 0.1–1.0)
MONOS PCT: 7 %
Neutro Abs: 13.4 10*3/uL — ABNORMAL HIGH (ref 1.7–7.7)
Neutrophils Relative %: 90 %
PLATELETS: 151 10*3/uL (ref 150–400)
RBC: 4.73 MIL/uL (ref 3.87–5.11)
RDW: 14.5 % (ref 11.5–15.5)
WBC: 14.9 10*3/uL — ABNORMAL HIGH (ref 4.0–10.5)

## 2016-04-14 LAB — I-STAT CG4 LACTIC ACID, ED: LACTIC ACID, VENOUS: 5.95 mmol/L — AB (ref 0.5–2.0)

## 2016-04-14 LAB — COMPREHENSIVE METABOLIC PANEL
ALBUMIN: 3.6 g/dL (ref 3.5–5.0)
ALK PHOS: 64 U/L (ref 38–126)
ALT: 38 U/L (ref 14–54)
ALT: 38 U/L (ref 14–54)
ALT: 39 U/L (ref 14–54)
ANION GAP: 14 (ref 5–15)
AST: 67 U/L — AB (ref 15–41)
AST: 75 U/L — ABNORMAL HIGH (ref 15–41)
AST: 73 U/L — AB (ref 15–41)
Albumin: 3.2 g/dL — ABNORMAL LOW (ref 3.5–5.0)
Albumin: 3.5 g/dL (ref 3.5–5.0)
Alkaline Phosphatase: 82 U/L (ref 38–126)
Alkaline Phosphatase: 70 U/L (ref 38–126)
Anion gap: 11 (ref 5–15)
Anion gap: 11 (ref 5–15)
BILIRUBIN TOTAL: 1.3 mg/dL — AB (ref 0.3–1.2)
BILIRUBIN TOTAL: 1.5 mg/dL — AB (ref 0.3–1.2)
BILIRUBIN TOTAL: 1.3 mg/dL — AB (ref 0.3–1.2)
BUN: 13 mg/dL (ref 6–20)
BUN: 11 mg/dL (ref 6–20)
BUN: 10 mg/dL (ref 6–20)
CHLORIDE: 104 mmol/L (ref 101–111)
CHLORIDE: 107 mmol/L (ref 101–111)
CO2: 18 mmol/L — ABNORMAL LOW (ref 22–32)
CO2: 21 mmol/L — ABNORMAL LOW (ref 22–32)
CO2: 19 mmol/L — ABNORMAL LOW (ref 22–32)
CREATININE: 1.15 mg/dL — AB (ref 0.44–1.00)
CREATININE: 1 mg/dL (ref 0.44–1.00)
Calcium: 8.8 mg/dL — ABNORMAL LOW (ref 8.9–10.3)
Calcium: 8 mg/dL — ABNORMAL LOW (ref 8.9–10.3)
Calcium: 8.3 mg/dL — ABNORMAL LOW (ref 8.9–10.3)
Chloride: 109 mmol/L (ref 101–111)
Creatinine, Ser: 1.27 mg/dL — ABNORMAL HIGH (ref 0.44–1.00)
GFR calc Af Amer: 48 mL/min — ABNORMAL LOW (ref >60)
GFR calc Af Amer: >60 mL/min (ref >60)
GFR, EST AFRICAN AMERICAN: 54 mL/min — AB (ref >60)
GFR, EST NON AFRICAN AMERICAN: 41 mL/min — AB (ref >60)
GFR, EST NON AFRICAN AMERICAN: 46 mL/min — AB (ref >60)
GFR, EST NON AFRICAN AMERICAN: 55 mL/min — AB (ref >60)
Glucose, Bld: 406 mg/dL — ABNORMAL HIGH (ref 65–99)
Glucose, Bld: 333 mg/dL — ABNORMAL HIGH (ref 65–99)
Glucose, Bld: 296 mg/dL — ABNORMAL HIGH (ref 65–99)
POTASSIUM: 4 mmol/L (ref 3.5–5.1)
Potassium: 4.2 mmol/L (ref 3.5–5.1)
Potassium: 4 mmol/L (ref 3.5–5.1)
Sodium: 136 mmol/L (ref 135–145)
Sodium: 139 mmol/L (ref 135–145)
Sodium: 139 mmol/L (ref 135–145)
TOTAL PROTEIN: 6.9 g/dL (ref 6.5–8.1)
TOTAL PROTEIN: 7.1 g/dL (ref 6.5–8.1)
Total Protein: 6.4 g/dL — ABNORMAL LOW (ref 6.5–8.1)

## 2016-04-14 LAB — BLOOD GAS, ARTERIAL
ACID-BASE DEFICIT: 4.1 mmol/L — AB (ref 0.0–2.0)
BICARBONATE: 19 meq/L — AB (ref 20.0–24.0)
DRAWN BY: 281201
FIO2: 0.21
O2 Saturation: 94.6 %
PATIENT TEMPERATURE: 98.6
PCO2 ART: 26.7 mmHg — AB (ref 35.0–45.0)
PH ART: 7.467 — AB (ref 7.350–7.450)
TCO2: 19.9 mmol/L (ref 0–100)
pO2, Arterial: 66.2 mmHg — ABNORMAL LOW (ref 80.0–100.0)

## 2016-04-14 LAB — PROCALCITONIN: Procalcitonin: 28.17 ng/mL

## 2016-04-14 LAB — URINALYSIS, ROUTINE W REFLEX MICROSCOPIC
BILIRUBIN URINE: NEGATIVE
Glucose, UA: >1000 mg/dL — AB
KETONES UR: 40 mg/dL — AB
LEUKOCYTES UA: NEGATIVE
Nitrite: NEGATIVE
PROTEIN: NEGATIVE mg/dL
SPECIFIC GRAVITY, URINE: 1.03 (ref 1.005–1.030)
pH: 5 (ref 5.0–8.0)

## 2016-04-14 LAB — URINE MICROSCOPIC-ADD ON: Bacteria, UA: NONE SEEN

## 2016-04-14 LAB — I-STAT TROPONIN, ED
TROPONIN I, POC: 0.11 ng/mL — AB (ref 0.00–0.08)
Troponin i, poc: 0.18 ng/mL (ref 0.00–0.08)

## 2016-04-14 LAB — BRAIN NATRIURETIC PEPTIDE: B NATRIURETIC PEPTIDE 5: 368.4 pg/mL — AB (ref 0.0–100.0)

## 2016-04-14 LAB — BILIRUBIN, FRACTIONATED(TOT/DIR/INDIR)
BILIRUBIN TOTAL: 1.5 mg/dL — AB (ref 0.3–1.2)
Bilirubin, Direct: 0.3 mg/dL (ref 0.1–0.5)
Indirect Bilirubin: 1.2 mg/dL — ABNORMAL HIGH (ref 0.3–0.9)

## 2016-04-14 LAB — GLUCOSE, CAPILLARY
GLUCOSE-CAPILLARY: 287 mg/dL — AB (ref 65–99)
Glucose-Capillary: 284 mg/dL — ABNORMAL HIGH (ref 65–99)

## 2016-04-14 LAB — MRSA PCR SCREENING: MRSA BY PCR: NEGATIVE

## 2016-04-14 LAB — TROPONIN I: TROPONIN I: 0.16 ng/mL — AB (ref <0.031)

## 2016-04-14 LAB — CBG MONITORING, ED: GLUCOSE-CAPILLARY: 355 mg/dL — AB (ref 65–99)

## 2016-04-14 MED ORDER — SODIUM CHLORIDE 0.9 % IV BOLUS (SEPSIS)
1000.0000 mL | Freq: Once | INTRAVENOUS | Status: AC
  Administered 2016-04-14: 1000 mL via INTRAVENOUS

## 2016-04-14 MED ORDER — VANCOMYCIN HCL IN DEXTROSE 750-5 MG/150ML-% IV SOLN
750.0000 mg | Freq: Two times a day (BID) | INTRAVENOUS | Status: DC
  Administered 2016-04-14: 750 mg via INTRAVENOUS
  Filled 2016-04-14 (×2): qty 150

## 2016-04-14 MED ORDER — VANCOMYCIN HCL IN DEXTROSE 1-5 GM/200ML-% IV SOLN
1000.0000 mg | Freq: Once | INTRAVENOUS | Status: DC
  Administered 2016-04-14: 1000 mg via INTRAVENOUS
  Filled 2016-04-14: qty 200

## 2016-04-14 MED ORDER — ACETAMINOPHEN 500 MG PO TABS
500.0000 mg | ORAL_TABLET | Freq: Four times a day (QID) | ORAL | Status: DC | PRN
  Administered 2016-04-14 – 2016-04-19 (×6): 500 mg via ORAL
  Filled 2016-04-14 (×5): qty 1

## 2016-04-14 MED ORDER — SODIUM CHLORIDE 0.9 % IV SOLN
INTRAVENOUS | Status: DC
  Administered 2016-04-14: 18:00:00 via INTRAVENOUS
  Administered 2016-04-15: 500 mL via INTRAVENOUS
  Administered 2016-04-15: 02:00:00 via INTRAVENOUS

## 2016-04-14 MED ORDER — ASPIRIN EC 325 MG PO TBEC
325.0000 mg | DELAYED_RELEASE_TABLET | Freq: Once | ORAL | Status: AC
  Administered 2016-04-14: 325 mg via ORAL
  Filled 2016-04-14: qty 1

## 2016-04-14 MED ORDER — CEFAZOLIN IN D5W 1 GM/50ML IV SOLN
1.0000 g | Freq: Three times a day (TID) | INTRAVENOUS | Status: DC
  Administered 2016-04-14 – 2016-04-15 (×2): 1 g via INTRAVENOUS
  Filled 2016-04-14 (×4): qty 50

## 2016-04-14 MED ORDER — INSULIN ASPART 100 UNIT/ML ~~LOC~~ SOLN
0.0000 [IU] | SUBCUTANEOUS | Status: DC
  Administered 2016-04-14: 8 [IU] via SUBCUTANEOUS
  Administered 2016-04-14: 5 [IU] via SUBCUTANEOUS
  Administered 2016-04-14: 8 [IU] via SUBCUTANEOUS
  Administered 2016-04-15: 3 [IU] via SUBCUTANEOUS
  Administered 2016-04-15 (×3): 5 [IU] via SUBCUTANEOUS
  Administered 2016-04-15: 3 [IU] via SUBCUTANEOUS
  Administered 2016-04-16 (×2): 5 [IU] via SUBCUTANEOUS
  Administered 2016-04-16: 3 [IU] via SUBCUTANEOUS
  Administered 2016-04-16 (×2): 5 [IU] via SUBCUTANEOUS

## 2016-04-14 MED ORDER — VANCOMYCIN HCL 10 G IV SOLR
2000.0000 mg | Freq: Once | INTRAVENOUS | Status: AC
  Administered 2016-04-14: 2000 mg via INTRAVENOUS
  Filled 2016-04-14: qty 2000

## 2016-04-14 MED ORDER — INSULIN ASPART 100 UNIT/ML ~~LOC~~ SOLN
5.0000 [IU] | Freq: Once | SUBCUTANEOUS | Status: AC
  Administered 2016-04-14: 5 [IU] via SUBCUTANEOUS
  Filled 2016-04-14: qty 1

## 2016-04-14 MED ORDER — ENOXAPARIN SODIUM 40 MG/0.4ML ~~LOC~~ SOLN
40.0000 mg | SUBCUTANEOUS | Status: DC
  Administered 2016-04-14: 40 mg via SUBCUTANEOUS
  Filled 2016-04-14: qty 0.4

## 2016-04-14 NOTE — H&P (Signed)
Date: 04/14/2016               Patient Name:  Marisa Davenport MRN: [id number]  DOB: [birth year] Age / Sex: 75 y.o., female   PCP: Laurey Morale, MD         Medical Service: Internal Medicine Teaching Service         Attending Physician: Dr. Aldine Contes, MD    First Contact: Dr. Zada Finders Pager: [phone]  Second Contact: Dr. Charlott Rakes Pager: [phone]       After Hours (After 5p/  First Contact Pager: [phone]  weekends / holidays): Second Contact Pager: [phone]   Chief Complaint: AMS  History of Present Illness: [name] is a 75 year old woman with PMH of Non-Hodgkin's lymphoma in remission, pre-diabetes, and chronic joint pain (right shoulder, left hip) and LLE cellulitis who presented to the ED with AMS. History is obtained from sister as patient is minimally responsive. Patient is in town from [place] her townhouse. She was sleeping on an air-mattress and was found on the floor by the mattress this morning, thought secondary to a fall which was not witnessed. Sister says patient was "foggy" this morning with limited communication and very lethargic. Patient was incontinent of bowel and bladder. Patient was reportedly at her baseline last night, communicating well and ambulatory. Sister called 911 after cleaning patient up per her request. She does not take any medications at home. Spoke to daughter over the phone, who reports that patient was scratched on her left leg by a cat about 1 week ago. Since then she seemed to be more sleepy. She was given homeopathic apis mellifica to treat her leg.  No ED provider note documented in EPIC. Per RN note, EMS reported that patient was hyperglycemic at 430 mg/dL, tachycardic, and A&Ox4. Labwork showed serum glucose 406, HCO3 18, anion gap 14, SCr 1.27 (0.9 three years ago), WBC 14.9, point of care trop 0.11, UA showed glucose >1000 large Hgb and 40 ketones, lactic acid 5.95.  A CXR showed cardiomegaly without acute  pathology. CT Head showed mild cerebral atrophy without hemorrhage, hydrocephalus, mass lesion, or acute infarct. She was given 2 L NS, 5 units novolog, and Vancomycin.     Social Hx: Currently lives in [place], used to live in [place] after displaced by [employer]. Here to renovate townhome to resell.  Family Hx: Breast cancer in sister  Meds: Current Facility-Administered Medications  Medication Dose Route Frequency Provider Last Rate Last Dose  . 0.9 %  sodium chloride infusion   Intravenous Continuous Riccardo Dubin, MD 125 mL/hr at 04/14/16 1756    . ceFAZolin (ANCEF) IVPB 1 g/50 mL premix  1 g Intravenous Q8H Rushil V Posey Pronto, MD      . enoxaparin (LOVENOX) injection 40 mg  40 mg Subcutaneous Q24H Rushil V Posey Pronto, MD      . insulin aspart (novoLOG) injection 0-15 Units  0-15 Units Subcutaneous Q4H Riccardo Dubin, MD   8 Units at 04/14/16 1831    Allergies: Allergies as of 04/14/2016 - Review Complete 04/14/2016  Allergen Reaction Noted  . Penicillins  05/17/2007   Past Medical History  Diagnosis Date  . Allergy   . Diabetes mellitus, type II ([place])   . Non Hodgkin's lymphoma ([place]) 1992     resolved   . Elevated BP   . Bursitis left hip  . Cat scratch of left lower leg 03/2016  . Cellulitis 03/2016    LEFT  LOWER LEG  . Hyperlipidemia   . Bilateral leg edema 03/2016   Past Surgical History  Procedure Laterality Date  . Appendectomy    . Bilateral oophorectomy    . Removal of basal cell carcinoma from left upper arm  2009    per Dr. Allyson Sabal  . Colonscopy  01-13-09    per Dr. Sharlett Iles, diverticulosis only, repeat in 10 years   . Abdominal hysterectomy    . Diagnostic mammogram  09/05/09  . Rotator cuff repair Right 2010 ?    Family History  Problem Relation Age of Onset  . Cancer Sister   . Heart disease Other   . Diabetes Other    Social History   Social History  . Marital Status: Widowed    Spouse Name: N/A  . Number of Children: N/A  . Years of  Education: N/A   Occupational History  . Not on file.   Social History Main Topics  . Smoking status: Former Research scientist (life sciences)  . Smokeless tobacco: Never Used  . Alcohol Use: 3.5 oz/week    7 Standard drinks or equivalent per week     Comment: glass wine daily  . Drug Use: No  . Sexual Activity: Not on file   Other Topics Concern  . Not on file   Social History Narrative    Review of Systems: ROS limited due to patient status. Review of Systems  Constitutional:       Denies pain.  Neurological: Positive for weakness.     Physical Exam: Limited due to patient status. Blood pressure 154/65, pulse 104, temperature 103.2 F (39.6 C), temperature source Oral, resp. rate 23, height 5\' 2"  (1.575 m), weight 202 lb 8 oz (91.853 kg), SpO2 97 %. Physical Exam  HENT:  Head: Normocephalic and atraumatic.  Does not open mouth to command.  Eyes: Pupils are equal, round, and reactive to light. No scleral icterus.  Opens eyes intermittently, not following finger on command  Neck: Carotid bruit is not present.  Cardiovascular: Normal rate and regular rhythm.   DP pulses +1 bilaterally, Port-a-cath right chest wall.  Pulmonary/Chest:  Difficult to assess due to body habitus and limited mobility, clear anteriorly  Abdominal: Soft. Bowel sounds are normal. There is no tenderness.  Musculoskeletal:  Pitting edema bilaterally. Cold purple toes bilateral feet.  Neurological:  Lethargic, intermittently opens eyes and responds with one word answers. Knows her birthday but not current year. Moves extremities spontaneously but not following commands well.  Skin:  Cellulitic appearance LLE, warm to touch, 2 cm non-purulent scratch medial lower extremity.      Lab results: Basic Metabolic Panel:  Recent Labs  04/14/16 1518 04/14/16 1824  NA 139 139  K 4.2 4.0  CL 107 109  CO2 21* 19*  GLUCOSE 333* 296*  BUN 11 10  CREATININE 1.15* 1.00  CALCIUM 8.0* 8.3*   Liver Function Tests:  Recent  Labs  04/14/16 1518 04/14/16 1824  AST 75* 73*  ALT 38 39  ALKPHOS 64 70  BILITOT 1.5* 1.5*  1.3*  PROT 6.4* 7.1  ALBUMIN 3.2* 3.5   No results for input(s): LIPASE, AMYLASE in the last 72 hours. No results for input(s): AMMONIA in the last 72 hours. CBC:  Recent Labs  04/14/16 1052  WBC 14.9*  NEUTROABS 13.4*  HGB 14.2  HCT 42.1  MCV 89.0  PLT 151   Cardiac Enzymes:  Recent Labs  04/14/16 1824  TROPONINI 0.16*   BNP: No results for input(s): PROBNP  in the last 72 hours. D-Dimer: No results for input(s): DDIMER in the last 72 hours. CBG:  Recent Labs  04/14/16 1233 04/14/16 1730 04/14/16 1921  GLUCAP 355* 284* 287*   Hemoglobin A1C: No results for input(s): HGBA1C in the last 72 hours. Fasting Lipid Panel: No results for input(s): CHOL, HDL, LDLCALC, TRIG, CHOLHDL, LDLDIRECT in the last 72 hours. Thyroid Function Tests: No results for input(s): TSH, T4TOTAL, FREET4, T3FREE, THYROIDAB in the last 72 hours. Anemia Panel: No results for input(s): VITAMINB12, FOLATE, FERRITIN, TIBC, IRON, RETICCTPCT in the last 72 hours. Coagulation: No results for input(s): LABPROT, INR in the last 72 hours. Urine Drug Screen: Drugs of Abuse  No results found for: LABOPIA, COCAINSCRNUR, LABBENZ, AMPHETMU, THCU, LABBARB  Alcohol Level: No results for input(s): ETH in the last 72 hours. Urinalysis:  Recent Labs  04/14/16 1100  COLORURINE YELLOW  LABSPEC 1.030  PHURINE 5.0  GLUCOSEU >1000*  HGBUR LARGE*  BILIRUBINUR NEGATIVE  KETONESUR 40*  PROTEINUR NEGATIVE  NITRITE NEGATIVE  LEUKOCYTESUR NEGATIVE     Imaging results:  Dg Chest 2 View  04/14/2016  CLINICAL DATA:  Weakness beginning last night.  Initial encounter. EXAM: CHEST  2 VIEW COMPARISON:  None. FINDINGS: There is cardiomegaly without edema. Lungs are clear. No pneumothorax or pleural effusion. Port-A-Cath is noted. Degenerative changes seen about the shoulders. IMPRESSION: Cardiomegaly without acute  disease. Electronically Signed   By: Drusilla Kanner M.D.   On: 04/14/2016 11:36   Ct Head Wo Contrast  04/14/2016  CLINICAL DATA:  Weakness. EXAM: CT HEAD WITHOUT CONTRAST TECHNIQUE: Contiguous axial images were obtained from the base of the skull through the vertex without intravenous contrast. COMPARISON:  None. FINDINGS: Mild cerebral atrophy. No acute intracranial abnormality. Specifically, no hemorrhage, hydrocephalus, mass lesion, acute infarction, or significant intracranial injury. No acute calvarial abnormality. Visualized paranasal sinuses and mastoids clear. Orbital soft tissues unremarkable. IMPRESSION: No acute intracranial abnormality. Electronically Signed   By: Charlett Nose M.D.   On: 04/14/2016 11:56    Other results: EKG: sinus tachy, no acute ischemic changes, anterolateral infarct.  Assessment & Plan by Problem: Active Problems:   Weakness  Altered Mental Status: Unclear history without significant PMH other than a remote diagnosis of pre-diabetes. She does have an elevated white count with apparently worsened LLE cellulitis and recent cat scratch which may indicate underlying infection causing sepsis. She has received 2 L NS already. Patient hyperglycemic with ketonuria and anion gap suggestive of mild DKA which may be secondary to acute infection.  ACS is also a concern and she has mildly increased troponin without acute ischemic changes. She is tachycardic, but otherwise oxygenating well on room air and normotensive. Low suspicion for PE, but may have DVT for which we will obtain dopplers. CXR did show cardiomegaly and patient has pitting edema bilaterally with cold cyanotic toes concerning for CHF.  -Continue Vancomycin -Add Cefazolin -f/u blood cultures, urine culture -Check PCT -follow lactic acid -IV NS @ 125 mL/hr -Trend troponins -Repeat EKG -TTE -Lower extremity dopplers -Telemetry -Check HgbA1C, BNP -CBG monitoring -SSI-M -Monitor respiratory  status  Hyperglycemia: Likely in mild DKA as stated above. CBGs improving with IVF resuscitation. No insulin infusion started, would continue to hold off on insulin drip. Continue IVF and SSI. -IVF and SSI as above   Diet: NPO  DVT ppx: Lovenox  Code: DNR (confirmed with daughter)    Dispo: Disposition is deferred at this time, awaiting improvement of current medical problems. Anticipated discharge in approximately  2-4 day(s).   The patient does not have a current PCP Laurey Morale, MD) and does need an [facility] hospital follow-up appointment after discharge.  The patient does have transportation limitations that hinder transportation to clinic appointments.  Signed: Zada Finders, MD 04/14/2016, 8:08 PM

## 2016-04-14 NOTE — ED Notes (Signed)
Pt taken to Xray.

## 2016-04-14 NOTE — ED Notes (Signed)
Pt requested bed pan. Bed pan placed but PT was unable to urinate.

## 2016-04-14 NOTE — ED Notes (Signed)
Performed In and Out with [place] RN: Urine collected, bladder drained

## 2016-04-14 NOTE — Progress Notes (Signed)
Pharmacy Antibiotic Note  Marisa Davenport is a 75 y.o. female admitted on 04/14/2016 with sepsis/cellulitis.  Pharmacy has been consulted for Vancomycin dosing. Recent cat scratch  Vancomycin 1 gram dose given at 13:15 pm  Plan: Vancomycin 750 mg iv Q 12 hours (goal trough 15 to 20 mcg/dL) Also on Ancef 1 gram iv Q 8 (watch with PCN allergy)  Height: 5\' 2"  (157.5 cm) Weight: 202 lb 8 oz (91.853 kg) IBW/kg (Calculated) : 50.1  Temp (24hrs), Avg:99.6 F (37.6 C), Min:97.5 F (36.4 C), Max:103.2 F (39.6 C)   Recent Labs Lab 04/14/16 1052 04/14/16 1257 04/14/16 1518 04/14/16 1824  WBC 14.9*  --   --   --   CREATININE 1.27*  --  1.15* 1.00  LATICACIDVEN  --  5.95*  --   --     Estimated Creatinine Clearance: 53.6 mL/min (by C-G formula based on Cr of 1).    Allergies  Allergen Reactions  . Penicillins     REACTION: hives     Thank you for allowing pharmacy to be a part of this patient's care. Anette Guarneri, PharmD [phone] 04/14/2016 8:21 PM

## 2016-04-14 NOTE — ED Notes (Signed)
Pt reports to the ED following a fall. Pt is from [place] and came here to clean her house here. This am she was found on her floor beside her bed (simply a mattress on the floor). She had rolled out of the bed onto the floor. She was unable to get up which was very unusual for her. She is normally ambulatory without difficulty. She was hyperglycemic at 430 mg/dl on EMS arrival. Hx of prediabetes, not on any medications. She complains of some soreness all over and generalized weakness. Approx midnight last night is when she rolled onto the floor. 12 lead showed sinus tach at a rate of 120s. Pt was incontinent of bowel and bladder. Pt A&Ox4, resp e/u, and skin warm and dry.

## 2016-04-14 NOTE — Progress Notes (Signed)
04/14/2016 Patient came from the emergency room to 2 central at 1615. She is alert to self and have confusion. Patient have cellulitis to the left lower leg, red and warm to touch. Skin tear on left arm, bilateral bruising on arms, heels dry, foot cold. On lower back pinple. When RN went in patient room toes or purple and cold. Dr Posey Pronto  Internal Medicine teaching service was called per MD is is aware. MRSA swab and CHG bath was completed. [facility] RN.

## 2016-04-15 ENCOUNTER — Inpatient Hospital Stay (HOSPITAL_COMMUNITY): Payer: 59

## 2016-04-15 DIAGNOSIS — A419 Sepsis, unspecified organism: Principal | ICD-10-CM

## 2016-04-15 DIAGNOSIS — I4891 Unspecified atrial fibrillation: Secondary | ICD-10-CM

## 2016-04-15 DIAGNOSIS — B955 Unspecified streptococcus as the cause of diseases classified elsewhere: Secondary | ICD-10-CM

## 2016-04-15 DIAGNOSIS — I361 Nonrheumatic tricuspid (valve) insufficiency: Secondary | ICD-10-CM

## 2016-04-15 DIAGNOSIS — E1310 Other specified diabetes mellitus with ketoacidosis without coma: Secondary | ICD-10-CM

## 2016-04-15 DIAGNOSIS — L0390 Cellulitis, unspecified: Secondary | ICD-10-CM

## 2016-04-15 DIAGNOSIS — L03116 Cellulitis of left lower limb: Secondary | ICD-10-CM

## 2016-04-15 LAB — CBC
HEMATOCRIT: 39.9 % (ref 36.0–46.0)
HEMOGLOBIN: 13.2 g/dL (ref 12.0–15.0)
MCH: 29.7 pg (ref 26.0–34.0)
MCHC: 33.1 g/dL (ref 30.0–36.0)
MCV: 89.7 fL (ref 78.0–100.0)
Platelets: 122 10*3/uL — ABNORMAL LOW (ref 150–400)
RBC: 4.45 MIL/uL (ref 3.87–5.11)
RDW: 14.9 % (ref 11.5–15.5)
WBC: 9 10*3/uL (ref 4.0–10.5)

## 2016-04-15 LAB — BLOOD CULTURE ID PANEL (REFLEXED)
Acinetobacter baumannii: NOT DETECTED
CANDIDA ALBICANS: NOT DETECTED
CANDIDA GLABRATA: NOT DETECTED
CANDIDA KRUSEI: NOT DETECTED
CANDIDA PARAPSILOSIS: NOT DETECTED
Candida tropicalis: NOT DETECTED
Carbapenem resistance: NOT DETECTED
ENTEROBACTER CLOACAE COMPLEX: NOT DETECTED
ENTEROBACTERIACEAE SPECIES: NOT DETECTED
Enterococcus species: NOT DETECTED
Escherichia coli: NOT DETECTED
Haemophilus influenzae: NOT DETECTED
KLEBSIELLA OXYTOCA: NOT DETECTED
KLEBSIELLA PNEUMONIAE: NOT DETECTED
Listeria monocytogenes: NOT DETECTED
Methicillin resistance: NOT DETECTED
Neisseria meningitidis: NOT DETECTED
PROTEUS SPECIES: NOT DETECTED
PSEUDOMONAS AERUGINOSA: NOT DETECTED
STREPTOCOCCUS AGALACTIAE: NOT DETECTED
STREPTOCOCCUS PNEUMONIAE: NOT DETECTED
Serratia marcescens: NOT DETECTED
Staphylococcus aureus (BCID): NOT DETECTED
Staphylococcus species: NOT DETECTED
Streptococcus pyogenes: NOT DETECTED
Streptococcus species: DETECTED — AB
Vancomycin resistance: NOT DETECTED

## 2016-04-15 LAB — COMPREHENSIVE METABOLIC PANEL
ALBUMIN: 3 g/dL — AB (ref 3.5–5.0)
ALT: 35 U/L (ref 14–54)
ANION GAP: 7 (ref 5–15)
AST: 62 U/L — AB (ref 15–41)
Alkaline Phosphatase: 56 U/L (ref 38–126)
BILIRUBIN TOTAL: 1.1 mg/dL (ref 0.3–1.2)
BUN: 11 mg/dL (ref 6–20)
CHLORIDE: 109 mmol/L (ref 101–111)
CO2: 22 mmol/L (ref 22–32)
Calcium: 7.9 mg/dL — ABNORMAL LOW (ref 8.9–10.3)
Creatinine, Ser: 0.92 mg/dL (ref 0.44–1.00)
GFR calc Af Amer: >60 mL/min (ref >60)
GFR calc non Af Amer: >60 mL/min (ref >60)
GLUCOSE: 219 mg/dL — AB (ref 65–99)
POTASSIUM: 3.8 mmol/L (ref 3.5–5.1)
SODIUM: 138 mmol/L (ref 135–145)
TOTAL PROTEIN: 6.2 g/dL — AB (ref 6.5–8.1)

## 2016-04-15 LAB — LACTIC ACID, PLASMA
LACTIC ACID, VENOUS: 2.1 mmol/L — AB (ref 0.5–2.0)
Lactic Acid, Venous: 2.2 mmol/L (ref 0.5–2.0)
Lactic Acid, Venous: 2.8 mmol/L (ref 0.5–2.0)

## 2016-04-15 LAB — GLUCOSE, CAPILLARY
GLUCOSE-CAPILLARY: 158 mg/dL — AB (ref 65–99)
GLUCOSE-CAPILLARY: 185 mg/dL — AB (ref 65–99)
Glucose-Capillary: 216 mg/dL — ABNORMAL HIGH (ref 65–99)
Glucose-Capillary: 227 mg/dL — ABNORMAL HIGH (ref 65–99)
Glucose-Capillary: 223 mg/dL — ABNORMAL HIGH (ref 65–99)
Glucose-Capillary: 246 mg/dL — ABNORMAL HIGH (ref 65–99)

## 2016-04-15 LAB — HIV ANTIBODY (ROUTINE TESTING W REFLEX): HIV SCREEN 4TH GENERATION: NONREACTIVE

## 2016-04-15 LAB — ECHOCARDIOGRAM COMPLETE
E/e' ratio: 7.89
EWDT: 218 ms
FS: 45 % — AB (ref 28–44)
Height: 62 in
IVS/LV PW RATIO, ED: 1.13
LA diam index: 1.43 cm/m2
LA vol index: 18.8 mL/m2
LASIZE: 28 mm
LAVOL: 36.9 mL
LAVOLA4C: 29.8 mL
LDCA: 3.14 cm2
LEFT ATRIUM END SYS DIAM: 28 mm
LV E/e' medial: 7.89
LV E/e'average: 7.89
LV PW d: 14.6 mm — AB (ref 0.6–1.1)
LVELAT: 10.7 cm/s
LVOTD: 20 mm
MV Dec: 218
MV pk A vel: 77.1 m/s
MV pk E vel: 84.4 m/s
MVPG: 3 mmHg
TAPSE: 30.5 mm
TDI e' lateral: 10.7
TDI e' medial: 8.05
Weight: 3375.68 oz

## 2016-04-15 LAB — HEMOGLOBIN A1C
HEMOGLOBIN A1C: 8.6 % — AB (ref 4.8–5.6)
MEAN PLASMA GLUCOSE: 200 mg/dL

## 2016-04-15 LAB — TROPONIN I: Troponin I: 0.12 ng/mL — ABNORMAL HIGH (ref <0.031)

## 2016-04-15 MED ORDER — ENOXAPARIN SODIUM 100 MG/ML ~~LOC~~ SOLN
95.0000 mg | Freq: Two times a day (BID) | SUBCUTANEOUS | Status: DC
  Administered 2016-04-15 – 2016-04-16 (×2): 95 mg via SUBCUTANEOUS
  Filled 2016-04-15 (×2): qty 1

## 2016-04-15 MED ORDER — CEFAZOLIN SODIUM-DEXTROSE 2-4 GM/100ML-% IV SOLN
2.0000 g | Freq: Three times a day (TID) | INTRAVENOUS | Status: DC
  Administered 2016-04-15 – 2016-04-16 (×4): 2 g via INTRAVENOUS
  Filled 2016-04-15 (×7): qty 100

## 2016-04-15 MED ORDER — LIVING WELL WITH DIABETES BOOK
Freq: Once | Status: AC
  Administered 2016-04-15: 15:00:00
  Filled 2016-04-15: qty 1

## 2016-04-15 MED ORDER — ONDANSETRON HCL 4 MG/2ML IJ SOLN
4.0000 mg | Freq: Four times a day (QID) | INTRAMUSCULAR | Status: DC | PRN
  Administered 2016-04-15: 4 mg via INTRAVENOUS
  Filled 2016-04-15 (×2): qty 2

## 2016-04-15 MED ORDER — SODIUM CHLORIDE 0.9 % IV SOLN
INTRAVENOUS | Status: AC
  Administered 2016-04-15 – 2016-04-16 (×2): via INTRAVENOUS

## 2016-04-15 MED ORDER — DILTIAZEM HCL 25 MG/5ML IV SOLN
10.0000 mg | Freq: Once | INTRAVENOUS | Status: AC
  Administered 2016-04-15: 10 mg via INTRAVENOUS
  Filled 2016-04-15 (×2): qty 5

## 2016-04-15 MED ORDER — INSULIN GLARGINE 100 UNIT/ML ~~LOC~~ SOLN
10.0000 [IU] | Freq: Every day | SUBCUTANEOUS | Status: DC
  Administered 2016-04-15 – 2016-04-16 (×2): 10 [IU] via SUBCUTANEOUS
  Filled 2016-04-15 (×4): qty 0.1

## 2016-04-15 MED ORDER — DILTIAZEM HCL 25 MG/5ML IV SOLN
15.0000 mg | Freq: Once | INTRAVENOUS | Status: AC
  Administered 2016-04-15: 15 mg via INTRAVENOUS
  Filled 2016-04-15: qty 5

## 2016-04-15 NOTE — Progress Notes (Addendum)
   Subjective: Patient more lucid this morning, eating breakfast, alert and communicative. She states that she rolled out of her air mattress, but did not fall or syncopize. RN notified me that patient had episode of nausea and vomiting this afternoon and is tachycardic up to 180s. Patient seen at bedside at appears comfortable without any chest pain, dyspnea, palpitations, or confusion. She feels some nausea and generalized weakness. Temperature is 101.7 F. EKG shows irregular rhythm with rate of 158, concerning for Afib RVR.   Objective: Vital signs in last 24 hours: Filed Vitals:   04/15/16 1100 04/15/16 1250 04/15/16 1639 04/15/16 1727  BP: 114/46 158/61 140/61   Pulse:  88 84   Temp:  98.6 F (37 C) 101.7 F (38.7 C) 100.3 F (37.9 C)  TempSrc:  Oral  Oral  Resp: 21 20 19    Height:  5\' 2"  (1.575 m)    Weight:  209 lb 7 oz (95 kg)    SpO2: 97% 100% 97%    Weight change:   Intake/Output Summary (Last 24 hours) at 04/15/16 1735 Last data filed at 04/15/16 1139  Gross per 24 hour  Intake 2464.58 ml  Output      0 ml  Net 2464.58 ml   General: resting in bed comfortably Cardiac: tachycardic, irregularly irregular Pulm: clear to auscultation anteriorly, moving normal volumes of air Abd: soft, nontender, nondistended, BS present Ext: LLE cellulitis with improved borders, no purulent drainage from medial scratch, still warm to touch, improvement in purplish discoloration of toes and no longer cold to touch Neuro: alert and oriented X3, conversing appropriately  Assessment/Plan: Active Problems:   Weakness  Streptococcus Bacteremia: Likely seeded after cat scratch at her LLE cellulitis. 1 of 2 blood cultures growing streptococcus species, pending sensitivities. Have discontinued Vancomycin and continuing Cefazolin. Patient with elevated temp of 101.7 this afternoon, but improved mental status and lactic acid trending down. Procalcitonin was elevated at 28. WBC normalized.    -Continue Cefazolin per pharmacy -f/u blood culture sensitivity, adjust antibiotics as indicated -IV NS @ 125 mL/hr -f/u PCT -CBC in AM  Afib RVR: Developed this evening with rates from 130s to 180s. Feel this is likely secondary to her infection. She is alert and communicative, does not report chest pain or palpitations and oxygenating well on room air. Will attempt rate control with initial IV Diltiazem push. May need to transfer to cardiac telemetry if not controlled to initiate Diltiazem drip. Repeat troponin is 0.12, improved from 0.16 yesterday. -Given IV Diltiazem 10 mg once -Consider IV Diltiazem 20 mg push if no improvement after initial dose -If no improvement after 2nd dose, would start Diltiazem drip and transfer to cardiac telemetry -Check TSH  T2DM: Hgb A1C 8.6. CBGs improved during the day to 150-180s, most recently 223. Oral intake now limited due to nausea. -SSI-M -Add Lantus 10 units qhs -IVF as above -Add Metformin on discharge  Dispo: Disposition is deferred at this time, awaiting improvement of current medical problems.  Anticipated discharge in approximately 1-3 day(s).   The patient does not have a current PCP Laurey Morale, MD) and does need an [facility] hospital follow-up appointment after discharge.  The patient does have transportation limitations that hinder transportation to clinic appointments.    LOS: 1 day   Zada Finders, MD 04/15/2016, 5:35 PM

## 2016-04-15 NOTE — Significant Event (Signed)
Rapid Response Event Note  Overview:  Called to assist with administration of Cardizem to patient   Time Called: 1643 Arrival Time: 1646 Event Type: Cardiac  Initial Focused Assessment:  Patient alert and oriented - c/o nausea - just had dose Zofran per Katie RN - Dr. Posey Pronto at bedside - patient with episode of new onset afib with RVR - patient denies CP or SOB.  Has fevery 101.7 orally.  BP 140/119.  Denies palpitations.   Afib RVR on monitor with rate 143.  RR 24.  O2 sats 94% on 2 liters.    Interventions:  Placed on 2 liters nasal cannula.  IV right hand assessed - intact - NS infusing at 125 hr.  Cardizem 10 mg IV given slow push per order Dr. Posey Pronto.  HR briefly down to 120 But returned to 130's.  BP 136/69 HR 138 RR 24 O2 sats 96%.  Resting NAD - continues to deny CP, SOB or palpitations.  Katie RN to given MD Posey Pronto update.  HR remains afib RVR - patient asymptomatic at this point. Remains with fever 100.8 oral.   Plan of Care (if not transferred):  Monitor HR and BP - call RRT as needed. RN Joellen Jersey to update MD on results from Cardizem.  Event Summary: Name of Physician Notified: dr. Posey Pronto at  (at bedside at time of call)    at    Outcome: Other (Comment) (pending MD reasessessment)  Event End Time: 1730  Quin Hoop

## 2016-04-15 NOTE — Progress Notes (Signed)
Report called to 5W. RN on break. Awaiting call back.

## 2016-04-15 NOTE — Clinical Documentation Improvement (Signed)
Hospitalist  Would you please clarify medical condition related to clinical findings?   Sepsis - specify causative organism if known}  Other  Clinically Undetermined  Document any associated diagnoses/conditions.   Supporting Information: EDP noted sepsis with cellulitis.  No noted in H&P.  Blood culture growing gram positive cocci in chains.  First lactic acid was 5.95 and follow up was 2.22 and then 2.8 2 hours later. First WBC was 14.9 and second one was 9.0.  She was noted to alerted mental status, Pulse of 104   Please exercise your independent, professional judgment when responding. A specific answer is not anticipated or expected.   Thank You,  [place] [phone]

## 2016-04-15 NOTE — Progress Notes (Signed)
ANTICOAGULATION CONSULT NOTE - Initial Consult  Pharmacy Consult for Lovenox Indication: atrial fibrillation  Allergies  Allergen Reactions  . Penicillins     REACTION: hives    Patient Measurements: Height: 5\' 2"  (157.5 cm) Weight: 209 lb 7 oz (95 kg) IBW/kg (Calculated) : 50.1  Vital Signs: Temp: 100.3 F (37.9 C) (06/22 1727) Temp Source: Oral (06/22 1727) BP: 140/61 mmHg (06/22 1639) Pulse Rate: 84 (06/22 1639)  Labs:  Recent Labs  04/14/16 1052 04/14/16 1518 04/14/16 1824 04/15/16 0240 04/15/16 1625  HGB 14.2  --   --  13.2  --   HCT 42.1  --   --  39.9  --   PLT 151  --   --  122*  --   CREATININE 1.27* 1.15* 1.00 0.92  --   TROPONINI  --   --  0.16*  --  0.12*    Estimated Creatinine Clearance: 59.4 mL/min (by C-G formula based on Cr of 0.92).   Medical History: Past Medical History  Diagnosis Date  . Allergy   . Diabetes mellitus, type II ([place])   . Non Hodgkin's lymphoma ([place]) 1992     resolved   . Elevated BP   . Bursitis left hip  . Cat scratch of left lower leg 03/2016  . Cellulitis 03/2016    LEFT LOWER LEG  . Hyperlipidemia   . Bilateral leg edema 03/2016    Assessment: 75 year old female admitted 6/21 with possible sepsis secondary to cat scratch of lower leg, continues on cefazolin, is now in Afib and is to start treatment dose Lovenox  Goal of Therapy:  Anti-Xa level 0.6-1 units/ml 4hrs after LMWH dose given Monitor platelets by anticoagulation protocol: Yes   Plan:  Lovenox 95 mg sq Q 12 hours Follow up CBC  Thank you Anette Guarneri, PharmD [phone]  04/15/2016,8:14 PM

## 2016-04-15 NOTE — Progress Notes (Signed)
PHARMACY - PHYSICIAN COMMUNICATION CRITICAL VALUE ALERT - BLOOD CULTURE IDENTIFICATION (BCID)  Results for orders placed or performed during the hospital encounter of 04/14/16  Blood Culture ID Panel (Reflexed) (Collected: 04/14/2016 12:50 PM)  Result Value Ref Range   Enterococcus species NOT DETECTED NOT DETECTED   Vancomycin resistance NOT DETECTED NOT DETECTED   Listeria monocytogenes NOT DETECTED NOT DETECTED   Staphylococcus species NOT DETECTED NOT DETECTED   Staphylococcus aureus NOT DETECTED NOT DETECTED   Methicillin resistance NOT DETECTED NOT DETECTED   Streptococcus species DETECTED (A) NOT DETECTED   Streptococcus agalactiae NOT DETECTED NOT DETECTED   Streptococcus pneumoniae NOT DETECTED NOT DETECTED   Streptococcus pyogenes NOT DETECTED NOT DETECTED   Acinetobacter baumannii NOT DETECTED NOT DETECTED   Enterobacteriaceae species NOT DETECTED NOT DETECTED   Enterobacter cloacae complex NOT DETECTED NOT DETECTED   Escherichia coli NOT DETECTED NOT DETECTED   Klebsiella oxytoca NOT DETECTED NOT DETECTED   Klebsiella pneumoniae NOT DETECTED NOT DETECTED   Proteus species NOT DETECTED NOT DETECTED   Serratia marcescens NOT DETECTED NOT DETECTED   Carbapenem resistance NOT DETECTED NOT DETECTED   Haemophilus influenzae NOT DETECTED NOT DETECTED   Neisseria meningitidis NOT DETECTED NOT DETECTED   Pseudomonas aeruginosa NOT DETECTED NOT DETECTED   Candida albicans NOT DETECTED NOT DETECTED   Candida glabrata NOT DETECTED NOT DETECTED   Candida krusei NOT DETECTED NOT DETECTED   Candida parapsilosis NOT DETECTED NOT DETECTED   Candida tropicalis NOT DETECTED NOT DETECTED    Name of physician (or Provider) Contacted: Dr. Marlowe Sax   Changes to prescribed antibiotics required: Continue Vancomycin/Ancef as ordered for now  Narda Bonds 04/15/2016  6:14 AM

## 2016-04-15 NOTE — Progress Notes (Signed)
Lab tech was unable to collect lactic acid this morning. Will attempt to collect.

## 2016-04-15 NOTE — Progress Notes (Signed)
Report given to RN on 5W. Will transport in a few minutes.

## 2016-04-15 NOTE — Progress Notes (Addendum)
Per IMTS cancel current labs, try again for am labs.

## 2016-04-15 NOTE — ED Provider Notes (Signed)
CSN: [id number]     Arrival date & time 04/14/16  1022 History   First MD Initiated Contact with Patient 04/14/16 1022     Chief Complaint  Patient presents with  . Fall     (Consider location/radiation/quality/duration/timing/severity/associated sxs/prior Treatment) Patient is a 75 y.o. female presenting with fall. The history is provided by the patient (pt states she fell out of bed and is weak).  Fall This is a new problem. The current episode started 1 to 2 hours ago. The problem occurs rarely. The problem has not changed since onset.Pertinent negatives include no chest pain, no abdominal pain and no headaches. Nothing aggravates the symptoms. Nothing relieves the symptoms.    Past Medical History  Diagnosis Date  . Allergy   . Diabetes mellitus, type II ([place])   . Non Hodgkin's lymphoma ([place]) 1992     resolved   . Elevated BP   . Bursitis left hip  . Cat scratch of left lower leg 03/2016  . Cellulitis 03/2016    LEFT LOWER LEG  . Hyperlipidemia   . Bilateral leg edema 03/2016   Past Surgical History  Procedure Laterality Date  . Appendectomy    . Bilateral oophorectomy    . Removal of basal cell carcinoma from left upper arm  2009    per Dr. Allyson Sabal  . Colonscopy  01-13-09    per Dr. Sharlett Iles, diverticulosis only, repeat in 10 years   . Abdominal hysterectomy    . Diagnostic mammogram  09/05/09  . Rotator cuff repair Right 2010 ?    Family History  Problem Relation Age of Onset  . Cancer Sister   . Heart disease Other   . Diabetes Other    Social History  Substance Use Topics  . Smoking status: Former Research scientist (life sciences)  . Smokeless tobacco: Never Used  . Alcohol Use: 3.5 oz/week    7 Standard drinks or equivalent per week     Comment: glass wine daily   OB History    No data available     Review of Systems  Constitutional: Positive for fatigue. Negative for appetite change.  HENT: Negative for congestion, ear discharge and sinus pressure.   Eyes: Negative for  discharge.  Respiratory: Negative for cough.   Cardiovascular: Negative for chest pain.  Gastrointestinal: Negative for abdominal pain and diarrhea.  Genitourinary: Negative for frequency and hematuria.  Musculoskeletal: Negative for back pain.  Skin: Negative for rash.  Neurological: Negative for seizures and headaches.  Psychiatric/Behavioral: Negative for hallucinations.      Allergies  Penicillins  Home Medications   Prior to Admission medications   Medication Sig Start Date End Date Taking? Authorizing Provider  OVER THE COUNTER MEDICATION Take 1 Dose by mouth daily.   Yes Historical Provider, MD   BP 138/65 mmHg  Pulse 104  Temp(Src) 98 F (36.7 C) (Oral)  Resp 19  Ht 5\' 2"  (1.575 m)  Wt 210 lb 15.7 oz (95.7 kg)  BMI 38.58 kg/m2  SpO2 98% Physical Exam  Constitutional: She is oriented to person, place, and time. She appears well-developed.  HENT:  Head: Normocephalic.  Eyes: Conjunctivae and EOM are normal. No scleral icterus.  Neck: Neck supple. No thyromegaly present.  Cardiovascular: Normal rate and regular rhythm.  Exam reveals no gallop and no friction rub.   No murmur heard. Pulmonary/Chest: No stridor. She has no wheezes. She has no rales. She exhibits no tenderness.  Abdominal: She exhibits no distension. There is no tenderness. There is  no rebound.  Musculoskeletal: Normal range of motion. She exhibits no edema.  Lymphadenopathy:    She has no cervical adenopathy.  Neurological: She is oriented to person, place, and time. She exhibits normal muscle tone. Coordination normal.  Skin: Rash noted. No erythema.  Rash to left leg  Psychiatric: She has a normal mood and affect. Her behavior is normal.    ED Course  Procedures (including critical care time) Labs Review Labs Reviewed  BLOOD CULTURE ID PANEL (REFLEXED) - Abnormal; Notable for the following:    Streptococcus species DETECTED (*)    All other components within normal limits  CBC WITH  DIFFERENTIAL/PLATELET - Abnormal; Notable for the following:    WBC 14.9 (*)    Neutro Abs 13.4 (*)    Lymphs Abs 0.4 (*)    All other components within normal limits  COMPREHENSIVE METABOLIC PANEL - Abnormal; Notable for the following:    CO2 18 (*)    Glucose, Bld 406 (*)    Creatinine, Ser 1.27 (*)    Calcium 8.8 (*)    AST 67 (*)    Total Bilirubin 1.3 (*)    GFR calc non Af Amer 41 (*)    GFR calc Af Amer 48 (*)    All other components within normal limits  URINALYSIS, ROUTINE W REFLEX MICROSCOPIC (NOT AT [facility]) - Abnormal; Notable for the following:    Glucose, UA >1000 (*)    Hgb urine dipstick LARGE (*)    Ketones, ur 40 (*)    All other components within normal limits  URINE MICROSCOPIC-ADD ON - Abnormal; Notable for the following:    Squamous Epithelial / LPF 0-5 (*)    All other components within normal limits  COMPREHENSIVE METABOLIC PANEL - Abnormal; Notable for the following:    CO2 21 (*)    Glucose, Bld 333 (*)    Creatinine, Ser 1.15 (*)    Calcium 8.0 (*)    Total Protein 6.4 (*)    Albumin 3.2 (*)    AST 75 (*)    Total Bilirubin 1.5 (*)    GFR calc non Af Amer 46 (*)    GFR calc Af Amer 54 (*)    All other components within normal limits  BLOOD GAS, ARTERIAL - Abnormal; Notable for the following:    pH, Arterial 7.467 (*)    pCO2 arterial 26.7 (*)    pO2, Arterial 66.2 (*)    Bicarbonate 19.0 (*)    Acid-base deficit 4.1 (*)    All other components within normal limits  HEMOGLOBIN A1C - Abnormal; Notable for the following:    Hgb A1c MFr Bld 8.6 (*)    All other components within normal limits  COMPREHENSIVE METABOLIC PANEL - Abnormal; Notable for the following:    Glucose, Bld 219 (*)    Calcium 7.9 (*)    Total Protein 6.2 (*)    Albumin 3.0 (*)    AST 62 (*)    All other components within normal limits  CBC - Abnormal; Notable for the following:    Platelets 122 (*)    All other components within normal limits  BRAIN NATRIURETIC PEPTIDE -  Abnormal; Notable for the following:    B Natriuretic Peptide 368.4 (*)    All other components within normal limits  COMPREHENSIVE METABOLIC PANEL - Abnormal; Notable for the following:    CO2 19 (*)    Glucose, Bld 296 (*)    Calcium 8.3 (*)  AST 73 (*)    Total Bilirubin 1.3 (*)    GFR calc non Af Amer 55 (*)    All other components within normal limits  BILIRUBIN, FRACTIONATED(TOT/DIR/INDIR) - Abnormal; Notable for the following:    Total Bilirubin 1.5 (*)    Indirect Bilirubin 1.2 (*)    All other components within normal limits  GLUCOSE, CAPILLARY - Abnormal; Notable for the following:    Glucose-Capillary 284 (*)    All other components within normal limits  TROPONIN I - Abnormal; Notable for the following:    Troponin I 0.16 (*)    All other components within normal limits  GLUCOSE, CAPILLARY - Abnormal; Notable for the following:    Glucose-Capillary 287 (*)    All other components within normal limits  GLUCOSE, CAPILLARY - Abnormal; Notable for the following:    Glucose-Capillary 216 (*)    All other components within normal limits  LACTIC ACID, PLASMA - Abnormal; Notable for the following:    Lactic Acid, Venous 2.2 (*)    All other components within normal limits  LACTIC ACID, PLASMA - Abnormal; Notable for the following:    Lactic Acid, Venous 2.8 (*)    All other components within normal limits  GLUCOSE, CAPILLARY - Abnormal; Notable for the following:    Glucose-Capillary 227 (*)    All other components within normal limits  I-STAT TROPOININ, ED - Abnormal; Notable for the following:    Troponin i, poc 0.11 (*)    All other components within normal limits  CBG MONITORING, ED - Abnormal; Notable for the following:    Glucose-Capillary 355 (*)    All other components within normal limits  I-STAT CG4 LACTIC ACID, ED - Abnormal; Notable for the following:    Lactic Acid, Venous 5.95 (*)    All other components within normal limits  I-STAT TROPOININ, ED -  Abnormal; Notable for the following:    Troponin i, poc 0.18 (*)    All other components within normal limits  CULTURE, BLOOD (ROUTINE X 2)  MRSA PCR SCREENING  CULTURE, BLOOD (ROUTINE X 2)  URINE CULTURE  PROCALCITONIN  HIV ANTIBODY (ROUTINE TESTING)  I-STAT CG4 LACTIC ACID, ED  I-STAT CG4 LACTIC ACID, ED    Imaging Review Dg Chest 2 View  04/14/2016  CLINICAL DATA:  Weakness beginning last night.  Initial encounter. EXAM: CHEST  2 VIEW COMPARISON:  None. FINDINGS: There is cardiomegaly without edema. Lungs are clear. No pneumothorax or pleural effusion. Port-A-Cath is noted. Degenerative changes seen about the shoulders. IMPRESSION: Cardiomegaly without acute disease. Electronically Signed   By: Inge Rise M.D.   On: 04/14/2016 11:36   Ct Head Wo Contrast  04/14/2016  CLINICAL DATA:  Weakness. EXAM: CT HEAD WITHOUT CONTRAST TECHNIQUE: Contiguous axial images were obtained from the base of the skull through the vertex without intravenous contrast. COMPARISON:  None. FINDINGS: Mild cerebral atrophy. No acute intracranial abnormality. Specifically, no hemorrhage, hydrocephalus, mass lesion, acute infarction, or significant intracranial injury. No acute calvarial abnormality. Visualized paranasal sinuses and mastoids clear. Orbital soft tissues unremarkable. IMPRESSION: No acute intracranial abnormality. Electronically Signed   By: Rolm Baptise M.D.   On: 04/14/2016 11:56   I have personally reviewed and evaluated these images and lab results as part of my medical decision-making.   EKG Interpretation   Date/Time:  Wednesday April 14 2016 10:33:36 EDT Ventricular Rate:  121 PR Interval:    QRS Duration: 95 QT Interval:  340 QTC Calculation: 483 R  Axis:   -117 Text Interpretation:  Sinus tachycardia Anterolateral infarct, age  indeterminate Confirmed by Rod Majerus  MD, Peniel Biel [phone]) on 04/14/2016  12:25:37 PM     CRITICAL CARE Performed by: Ansleigh Safer L Total critical care  time:45 minutes Critical care time was exclusive of separately billable procedures and treating other patients. Critical care was necessary to treat or prevent imminent or life-threatening deterioration. Critical care was time spent personally by me on the following activities: development of treatment plan with patient and/or surrogate as well as nursing, discussions with consultants, evaluation of patient's response to treatment, examination of patient, obtaining history from patient or surrogate, ordering and performing treatments and interventions, ordering and review of laboratory studies, ordering and review of radiographic studies, pulse oximetry and re-evaluation of patient's condition.   MDM   Final diagnoses:  Cellulitis and abscess of lower extremity   Pt is septic with cellulitis and will be admitted to medicine     Milton Ferguson, MD 04/15/16 [phone]

## 2016-04-15 NOTE — Progress Notes (Signed)
VASCULAR LAB PRELIMINARY  PRELIMINARY  PRELIMINARY  PRELIMINARY  Left lower extremity venous duplex has been completed.      Left:  No evidence of DVT, superficial thrombosis, or Baker's cyst.  However, sluggish blood flow in CFV-bilaterally.  Gave results to [place], [place], RVT, RDMS 04/15/2016, 10:12 AM

## 2016-04-15 NOTE — Progress Notes (Addendum)
CRITICAL VALUE ALERT  Critical value received:  Lactic acid 2.2  Date of notification:  04/15/2016   Time of notification:  0336  Critical value read back:Yes.    Nurse who received alert:  Reap, Jon Gills   MD notified (1st page):  [place] IMTS  Time of first page:  [phone]  MD notified (2nd page):  Time of second page:  Responding MD:  IMTS  Time MD responded:  3:51 AM

## 2016-04-15 NOTE — Progress Notes (Signed)
Patient tranferred to 5W08. Sister Marisa Davenport Arrived and met [place] in the new room .

## 2016-04-15 NOTE — Progress Notes (Signed)
*  PRELIMINARY RESULTS* Echocardiogram 2D Echocardiogram has been performed.  Marisa Davenport 04/15/2016, 11:21 AM

## 2016-04-15 NOTE — Progress Notes (Signed)
Received Diabetes Coordinator consult. Attempted to visit patient in room, but patient was sick and vomiting at this time. Will order Living Well with Diabetes booklet, will have staff RN help patient watch DM videos. Will see patient tomorrow. Harvel Ricks RN BSN CDE

## 2016-04-15 NOTE — Progress Notes (Addendum)
CRITICAL VALUE ALERT  Critical value received:  Lactic acid 2.8  Date of notification:  04/15/2016   Time of notification:  0555  Critical value read back:Yes.    Nurse who received alert:  Reap, Jon Gills   MD notified (1st page):  Heather Roberts  Time of first page:  0620  MD notified (2nd page):  Time of second page:  Responding MD:  IMTS  Time MD responded:  [phone]

## 2016-04-15 NOTE — Progress Notes (Addendum)
NURSING PROGRESS NOTE  Marisa Davenport [id number] Transfer Data: 04/15/2016 1:03 PM Attending Provider: Aldine Contes, MD PCP:FRY,STEPHEN A, MD Code Status: DNR  Marisa Davenport is a 75 y.o. female patient transferred from [place]  -No acute distress noted.  -No complaints of shortness of breath.  -No complaints of chest pain.   Cardiac Monitoring: Box # 8 in place.   Blood pressure 158/61, pulse 88, temperature 98.6 F (37 C), temperature source Oral, resp. rate 20, height 5\' 2"  (1.575 m), weight 95 kg (209 lb 7 oz), SpO2 100 %.   IV Fluids:  IV in place, may refer to [facility].  Allergies:  Penicillins  Past Medical History:   has a past medical history of Allergy; Diabetes mellitus, type II ([place]); Non Hodgkin's lymphoma ([place]) (1992 ); Elevated BP; Bursitis (left hip); Cat scratch of left lower leg (03/2016); Cellulitis (03/2016); Hyperlipidemia; and Bilateral leg edema (03/2016).  Past Surgical History:   has past surgical history that includes Appendectomy; Bilateral oophorectomy; removal of basal cell carcinoma from left upper arm (2009); colonscopy (01-13-09); Abdominal hysterectomy; DIAGNOSTIC MAMMOGRAM (09/05/09); and Rotator cuff repair (Right, 2010 ? ).  Social History:   reports that she has quit smoking. She has never used smokeless tobacco. She reports that she drinks about 3.5 oz of alcohol per week. She reports that she does not use illicit drugs.  Skin: Intact, toes and finger tips are noted to be slightly cyanotic in color.   Patient/Family orientated to room. Information packet given to patient/family. Admission inpatient armband information verified with patient/family to include name and date of birth and placed on patient arm. Side rails up x 2, fall assessment and education completed with patient/family. Patient/family able to verbalize understanding of risk associated with falls and verbalized understanding to call for assistance before getting out of bed. Call light within reach.  Patient/family able to voice and demonstrate understanding of unit orientation instructions.    Will continue to evaluate and treat per MD orders.

## 2016-04-15 NOTE — Clinical Documentation Improvement (Signed)
Hospitalist  Based on the clinical findings below, please document any associated diagnoses/conditions the patient has or may have.   Diabetes - specify type  Other  Clinically Undetermined  Supporting Information: In medical record states has past history of diabetes. HGB A1C = 8.6.  In another place it states that she is pre-diabetes.   Please exercise your independent, professional judgment when responding. A specific answer is not anticipated or expected.   Thank You, [place] [phone]

## 2016-04-15 NOTE — Progress Notes (Signed)
  Date: 04/15/2016  Patient name: Marisa Davenport  Medical record number: [id number]  Date of birth: Mar 13, 1941   I have seen and evaluated Claudette Stapler and discussed their care with the Residency Team. In brief, patient is a 76 y/o female with PMH of non Hodgkin's lymphoma, pre diabetes who p/w AMS * 1 day. Patient lives in [place] and is visiting here. She was found on the floor in her house next to the air mattress she was sleeping on. She was barely responsive when she was found and was noted to be incontinent. She was at baseline the night before. Was brought to the ED for further w/u. Of note, she was scratched by a cat on her left leg 1 week ago and was taking homeopathic medication for treatment. She was much improved this AM- AAO*3 and improving L LE celulitis.  PMHx, Fam Hx, and/or Soc Hx : as per resident admit note  Filed Vitals:   04/15/16 1639 04/15/16 1727  BP: 140/61   Pulse: 84   Temp: 101.7 F (38.7 C) 100.3 F (37.9 C)  Resp: 19      Assessment and Plan: I have seen and evaluated the patient as outlined above. I agree with the formulated Assessment and Plan as detailed in the residents' note, with the following changes:   1. Sepsis secondary to L LE cellulitis: - patient with AMS, strepococcal bacteria, leukocytosis and L LE erythema and swelling secondary to recent cat scratch consistent with sepsis from LE cellulitis. - c/w cefazolin for now. F/u sensitivities of blood cx - c/w IV NS - Leukocytosis now resolved and cellulitis improving. Will monitor closely  2. DKA: - Patient with mild DKA on admission. Improved with fluids and insulin - BS now much improved. Will monitor on lantus and sliding scale - Diabetes educator to follow up  3. Afib with RVR: - Patient developed afib with RVR with HR in the 130s to 180s this afternoon. - Given IV diltiazem. May need diltiazem gtt - Would start a/c for afib with elevated CHADS2Vasc score - consider cardio consult in AM  if remains in afib  Aldine Contes, MD 6/22/20176:21 PM

## 2016-04-15 NOTE — Clinical Documentation Improvement (Signed)
Hospitalist  Abnormal Lab/Test Results:  BNP is 368  Possible Clinical Conditions associated with below indicators  CHF - acute or chronic; diastolic or systolic  Other Condition  Cannot Clinically Determine   Supporting Information: Stated in record for possible CHF.  CXR shows cardiomyopathy.  She has edema of legs    Please exercise your independent, professional judgment when responding. A specific answer is not anticipated or expected.   Thank You,  [place] [phone]

## 2016-04-16 DIAGNOSIS — R7881 Bacteremia: Secondary | ICD-10-CM

## 2016-04-16 DIAGNOSIS — E876 Hypokalemia: Secondary | ICD-10-CM

## 2016-04-16 DIAGNOSIS — R197 Diarrhea, unspecified: Secondary | ICD-10-CM

## 2016-04-16 DIAGNOSIS — R946 Abnormal results of thyroid function studies: Secondary | ICD-10-CM

## 2016-04-16 DIAGNOSIS — B955 Unspecified streptococcus as the cause of diseases classified elsewhere: Secondary | ICD-10-CM

## 2016-04-16 DIAGNOSIS — E119 Type 2 diabetes mellitus without complications: Secondary | ICD-10-CM

## 2016-04-16 DIAGNOSIS — E1165 Type 2 diabetes mellitus with hyperglycemia: Secondary | ICD-10-CM

## 2016-04-16 DIAGNOSIS — L03116 Cellulitis of left lower limb: Secondary | ICD-10-CM

## 2016-04-16 LAB — CBC
HCT: 39.4 % (ref 36.0–46.0)
Hemoglobin: 13.2 g/dL (ref 12.0–15.0)
MCH: 29.6 pg (ref 26.0–34.0)
MCHC: 33.5 g/dL (ref 30.0–36.0)
MCV: 88.3 fL (ref 78.0–100.0)
PLATELETS: 131 10*3/uL — AB (ref 150–400)
RBC: 4.46 MIL/uL (ref 3.87–5.11)
RDW: 14.7 % (ref 11.5–15.5)
WBC: 8.3 10*3/uL (ref 4.0–10.5)

## 2016-04-16 LAB — COMPREHENSIVE METABOLIC PANEL
ALBUMIN: 2.7 g/dL — AB (ref 3.5–5.0)
ALT: 30 U/L (ref 14–54)
AST: 46 U/L — AB (ref 15–41)
Alkaline Phosphatase: 57 U/L (ref 38–126)
Anion gap: 8 (ref 5–15)
BUN: 11 mg/dL (ref 6–20)
CHLORIDE: 106 mmol/L (ref 101–111)
CO2: 21 mmol/L — AB (ref 22–32)
Calcium: 8.1 mg/dL — ABNORMAL LOW (ref 8.9–10.3)
Creatinine, Ser: 0.99 mg/dL (ref 0.44–1.00)
GFR calc Af Amer: >60 mL/min (ref >60)
GFR calc non Af Amer: 56 mL/min — ABNORMAL LOW (ref >60)
GLUCOSE: 210 mg/dL — AB (ref 65–99)
POTASSIUM: 2.9 mmol/L — AB (ref 3.5–5.1)
Sodium: 135 mmol/L (ref 135–145)
Total Bilirubin: 0.9 mg/dL (ref 0.3–1.2)
Total Protein: 6 g/dL — ABNORMAL LOW (ref 6.5–8.1)

## 2016-04-16 LAB — GLUCOSE, CAPILLARY
GLUCOSE-CAPILLARY: 220 mg/dL — AB (ref 65–99)
GLUCOSE-CAPILLARY: 220 mg/dL — AB (ref 65–99)
Glucose-Capillary: 189 mg/dL — ABNORMAL HIGH (ref 65–99)
Glucose-Capillary: 204 mg/dL — ABNORMAL HIGH (ref 65–99)
Glucose-Capillary: 229 mg/dL — ABNORMAL HIGH (ref 65–99)
Glucose-Capillary: 239 mg/dL — ABNORMAL HIGH (ref 65–99)

## 2016-04-16 LAB — T4, FREE: Free T4: 1.16 ng/dL — ABNORMAL HIGH (ref 0.61–1.12)

## 2016-04-16 LAB — TSH: TSH: 10.348 u[IU]/mL — ABNORMAL HIGH (ref 0.350–4.500)

## 2016-04-16 LAB — MAGNESIUM: Magnesium: 1.8 mg/dL (ref 1.7–2.4)

## 2016-04-16 LAB — C DIFFICILE QUICK SCREEN W PCR REFLEX
C DIFFICILE (CDIFF) INTERP: NEGATIVE
C DIFFICILE (CDIFF) TOXIN: NEGATIVE
C Diff antigen: NEGATIVE

## 2016-04-16 LAB — PROCALCITONIN: PROCALCITONIN: 22.11 ng/mL

## 2016-04-16 MED ORDER — LOPERAMIDE HCL 2 MG PO CAPS
2.0000 mg | ORAL_CAPSULE | ORAL | Status: DC | PRN
  Administered 2016-04-19 – 2016-04-21 (×2): 2 mg via ORAL
  Filled 2016-04-16 (×2): qty 1

## 2016-04-16 MED ORDER — INSULIN ASPART 100 UNIT/ML ~~LOC~~ SOLN
0.0000 [IU] | Freq: Three times a day (TID) | SUBCUTANEOUS | Status: DC
  Administered 2016-04-16: 5 [IU] via SUBCUTANEOUS
  Administered 2016-04-17: 8 [IU] via SUBCUTANEOUS
  Administered 2016-04-17 (×2): 3 [IU] via SUBCUTANEOUS
  Administered 2016-04-17: 8 [IU] via SUBCUTANEOUS
  Administered 2016-04-18 (×2): 5 [IU] via SUBCUTANEOUS
  Administered 2016-04-18: 3 [IU] via SUBCUTANEOUS
  Administered 2016-04-18: 2 [IU] via SUBCUTANEOUS
  Administered 2016-04-19: 8 [IU] via SUBCUTANEOUS
  Administered 2016-04-19 (×2): 3 [IU] via SUBCUTANEOUS
  Administered 2016-04-19: 8 [IU] via SUBCUTANEOUS
  Administered 2016-04-20: 5 [IU] via SUBCUTANEOUS
  Administered 2016-04-20: 3 [IU] via SUBCUTANEOUS

## 2016-04-16 MED ORDER — SODIUM CHLORIDE 0.9 % IV SOLN
INTRAVENOUS | Status: DC
  Administered 2016-04-16 (×2): via INTRAVENOUS

## 2016-04-16 MED ORDER — RIVAROXABAN 20 MG PO TABS
20.0000 mg | ORAL_TABLET | Freq: Every day | ORAL | Status: DC
  Administered 2016-04-16 – 2016-04-20 (×5): 20 mg via ORAL
  Filled 2016-04-16 (×5): qty 1

## 2016-04-16 MED ORDER — DEXTROSE 5 % IV SOLN
2.0000 g | Freq: Two times a day (BID) | INTRAVENOUS | Status: DC
  Administered 2016-04-16 – 2016-04-17 (×2): 2 g via INTRAVENOUS
  Filled 2016-04-16 (×3): qty 2

## 2016-04-16 MED ORDER — POTASSIUM CHLORIDE 20 MEQ/15ML (10%) PO SOLN
60.0000 meq | Freq: Once | ORAL | Status: AC
  Administered 2016-04-16: 60 meq via ORAL
  Filled 2016-04-16: qty 45

## 2016-04-16 MED ORDER — LOPERAMIDE HCL 2 MG PO CAPS
4.0000 mg | ORAL_CAPSULE | Freq: Once | ORAL | Status: AC
  Administered 2016-04-16: 4 mg via ORAL
  Filled 2016-04-16: qty 2

## 2016-04-16 NOTE — Plan of Care (Signed)
Problem: Food- and Nutrition-Related Knowledge Deficit (NB-1.1) Goal: Nutrition education Formal process to instruct or train a patient/client in a skill or to impart knowledge to help patients/clients voluntarily manage or modify food choices and eating behavior to maintain or improve health. Outcome: Adequate for Discharge  RD consulted for nutrition education regarding diabetes.     Lab Results  Component Value Date    HGBA1C 8.6* 04/14/2016    RD provided "Carbohydrate Counting for People with Diabetes" handout from the Academy of Nutrition and Dietetics. Discussed different food groups and their effects on blood sugar, emphasizing carbohydrate-containing foods. Provided list of carbohydrates and recommended serving sizes of common foods.  Discussed importance of controlled and consistent carbohydrate intake throughout the day. Provided examples of ways to balance meals/snacks and encouraged intake of high-fiber, whole grain complex carbohydrates. Teach back method used.  Expect fair compliance.  Body mass index is 38.3 kg/(m^2). Pt meets criteria for obesity, class II based on current BMI.  Current diet order is gluten free/ carb modified, patient is consuming approximately 100% of meals at this time. Labs and medications reviewed. No further nutrition interventions warranted at this time. RD contact information provided. If additional nutrition issues arise, please re-consult RD.  Wandy Bossler A. Jimmye Norman, RD, LDN, CDE Pager: [phone] After hours Pager: [phone]

## 2016-04-16 NOTE — Progress Notes (Signed)
Treatment for Left lower extremity completed.

## 2016-04-16 NOTE — Progress Notes (Signed)
Patient seen and examined. Case d/w residents in detail. I agree with findings and plan as documented in Dr. Serita Grit note.  Patient now complaining of diarrhea- C diff is negative. Cellulitis continues to improve. Patient with likely antibiotic associated diarrhea. Would consider switching to rocephin IV and see if diarrhea improves. Will f/u sensitivities of blood cx. Wound care follow up appreciated given some weeping from the cellulitis.  Afib now resolved. Would consider switching patient to xarelto for a/c.  Patient with elevated TSH in the setting of acute illness with mildly elevated free T4. Likely sick euthyroid. Will repeat as outpatient.

## 2016-04-16 NOTE — Consult Note (Signed)
WOC wound consult note Reason for Consult: LLE cellulitis/weeping Wound type: trauma, cat scratch reported per family and chart review Cellulitis with marked area of redness, appears to be improving.  Weeping noted on sheet, yellow.  One site medial thigh yellow.  Feet are cool and slightly mottled but not purple more red Drainage (amount, consistency, odor) serous, not purulent Periwound: cellulitis Dressing procedure/placement/frequency: Will add xeroform to the medial open sites and weeping posteriorly. Wrap with kerlix and ACE wrap, not tight.   Discussed POC with patient and bedside nurse.  Re consult if needed, will not follow at this time. Thanks  Rocklin Soderquist [place], [place] [phone])

## 2016-04-16 NOTE — Progress Notes (Signed)
Spoke with patient about her new onset diabetes.  States that she had been told that she had pre-diabetes.  Has not seen a doctor in the last year or so.  States that some in her family have diabetes.  HgbA1C is 8.6%. Explained to her about the average blood sugars over 2-3 months.  Reviewed normal blood sugar. Suggested that she get a blood sugar meter to check blood sugars at home.  Reviewed general care for her diabetes. Will order consult for a dietician to give her ideas on meal plans for DM.  Patient has Living Well with Diabetes booklet at bedside. Will need follow up with PCP for her blood glucose control after discharge. Will continue to monitor blood sugars while in the hospital. Harvel Ricks RN BSN CDE

## 2016-04-16 NOTE — Evaluation (Signed)
Physical Therapy Evaluation Patient Details Name: Marisa Davenport MRN: [id number] DOB: Mar 12, 1941 Today's Date: 04/16/2016   History of Present Illness  Pt adm with sepsis due to cellulitis from cat scratch on leg. Pt developed afib with RVR and diarrhea. Found to be diabetic as well. PMH - lymphoma in remission  Clinical Impression  Pt admitted with above diagnosis and presents to PT with functional limitations due to deficits listed below (See PT problem list). Pt needs skilled PT to maximize independence and safety to allow discharge to sister's home. Expect pt will make steady progress once diarrhea is under control. Could not attempt further gait today due to pt with uncontrolled diarrhea. Did show pt quad sets and straight leg raises to perform for strengthening.      Follow Up Recommendations Home health PT    Equipment Recommendations  Other (comment) (rollator)    Recommendations for Other Services       Precautions / Restrictions Precautions Precautions: Fall      Mobility  Bed Mobility Overal bed mobility: Needs Assistance Bed Mobility: Sit to Supine       Sit to supine: Min assist   General bed mobility comments: Assist to bring legs back into bed  Transfers Overall transfer level: Needs assistance Equipment used: Rolling walker (2 wheeled) Transfers: Sit to/from Stand Sit to Stand: Min assist         General transfer comment: Assist to bring hips up from low commode  Ambulation/Gait Ambulation/Gait assistance: Min guard Ambulation Distance (Feet): 12 Feet Assistive device: Rolling walker (2 wheeled) Gait Pattern/deviations: Step-through pattern;Decreased stride length;Trunk flexed Gait velocity: decr Gait velocity interpretation: Below normal speed for age/gender General Gait Details: Verbal cues to stand more erect and stay closer to walker  Stairs            Wheelchair Mobility    Modified Rankin (Stroke Patients Only)       Balance  Overall balance assessment: Needs assistance Sitting-balance support: No upper extremity supported;Feet supported Sitting balance-Leahy Scale: Good     Standing balance support: Bilateral upper extremity supported Standing balance-Leahy Scale: Poor Standing balance comment: walker for support                             Pertinent Vitals/Pain Pain Assessment: No/denies pain    Home Living Family/patient expects to be discharged to:: Private residence Living Arrangements: Children;Other relatives (to go to sister's house here at dc before back to [place]) Available Help at Discharge: Family Type of Home: House Home Access: Level entry     Home Layout: One level Home Equipment: None      Prior Function Level of Independence: Independent               Hand Dominance        Extremity/Trunk Assessment   Upper Extremity Assessment: Generalized weakness           Lower Extremity Assessment: Generalized weakness         Communication   Communication: No difficulties  Cognition Arousal/Alertness: Awake/alert Behavior During Therapy: WFL for tasks assessed/performed Overall Cognitive Status: Within Functional Limits for tasks assessed                      General Comments      Exercises        Assessment/Plan    PT Assessment Patient needs continued PT services  PT Diagnosis Difficulty walking;Generalized weakness  PT Problem List Decreased strength;Decreased activity tolerance;Decreased balance;Decreased mobility  PT Treatment Interventions DME instruction;Gait training;Functional mobility training;Therapeutic activities;Therapeutic exercise;Balance training;Patient/family education   PT Goals (Current goals can be found in the Care Plan section) Acute Rehab PT Goals Patient Stated Goal: return to [place] PT Goal Formulation: With patient Time For Goal Achievement: 04/23/16 Potential to Achieve Goals: Good    Frequency  Min 3X/week   Barriers to discharge        Co-evaluation               End of Session Equipment Utilized During Treatment: Gait belt Activity Tolerance: Other (comment) (Limited by diarrhea) Patient left: in bed;with call bell/phone within reach;with bed alarm set Nurse Communication: Mobility status         Time: [id number] PT Time Calculation (min) (ACUTE ONLY): 24 min   Charges:   PT Evaluation $PT Eval Moderate Complexity: 1 Procedure     PT G Codes:        [name] 2016-05-09, 3:30 PM [facility] PT [phone]

## 2016-04-16 NOTE — Care Management Note (Signed)
Case Management Note  Patient Details  Name: Marisa Davenport MRN: [id number] Date of Birth: 08/22/1941  Subjective/Objective:                 PAtient in town to renovate her townhouse. She is from [place] where she lives with her daughter. Sepsis AMS, IV Abx. Independent PTA.    Action/Plan:  CM will follow for DC planning.   Expected Discharge Date:                  Expected Discharge Plan:  Home/Self Care  In-House Referral:     Discharge planning Services  CM Consult  Post Acute Care Choice:    Choice offered to:     DME Arranged:    DME Agency:     HH Arranged:    HH Agency:     Status of Service:  In process, will continue to follow  If discussed at Long Length of Stay Meetings, dates discussed:    Additional Comments:  Carles Collet, RN 04/16/2016, 2:30 PM

## 2016-04-16 NOTE — Discharge Summary (Signed)
Name: Marisa Davenport MRN: [id number] DOB: 03-09-1941 75 y.o. PCP: Laurey Morale, MD  Date of Admission: 04/14/2016 10:22 AM Date of Discharge: 04/21/2016 Attending Physician: Aldine Contes, MD  Discharge Diagnosis: 1. Cellulitis complicated by Streptococcus bacteremia Active Problems:   Weakness   Bacteremia due to Streptococcus   Cellulitis of leg, left   T2DM (type 2 diabetes mellitus) (HCC)   Cellulitis and abscess of lower extremity  Discharge Medications:   Medication List    STOP taking these medications        OVER THE COUNTER MEDICATION      TAKE these medications        blood glucose meter kit and supplies  Dispense based on patient and insurance preference. Use up to four times daily as directed. (FOR ICD-9 250.00, 250.01).     cephALEXin 500 MG capsule  Commonly known as:  KEFLEX  Take 1 capsule (500 mg total) by mouth 4 (four) times daily.     doxycycline 100 MG tablet  Commonly known as:  VIBRA-TABS  Take 1 tablet (100 mg total) by mouth 2 (two) times daily.     Insulin Detemir 100 UNIT/ML Pen  Commonly known as:  LEVEMIR  Inject 12 Units into the skin daily at 10 pm.     metFORMIN 500 MG tablet  Commonly known as:  GLUCOPHAGE  Take 1 tablet (500 mg total) by mouth daily with breakfast.     metoprolol tartrate 25 MG tablet  Commonly known as:  LOPRESSOR  Take 1 tablet (25 mg total) by mouth 2 (two) times daily.     Pen Needles 3/16" 31G X 5 MM Misc  Please check your blood sugar three times a day prior to meals.     rivaroxaban 20 MG Tabs tablet  Commonly known as:  XARELTO  Take 1 tablet (20 mg total) by mouth daily with supper.     XEROFORM PETROLATUM DRESSING Pads  Apply 1 each topically daily.        Disposition and follow-up:   Marisa Davenport was discharged from [facility] in Stable condition.  At the hospital follow up visit please address:  1.   Cellulitis: Please address adherence to antibiotics (Cephalexin &  Doxycycline) and gradual improvement of LLE cellulitis and swelling.  Paroxysmal Atrial Fibrillation: CHADSVASc score 4. Continue Xarelto. Monitor for bleeding. Continue Metoprolol 25 mg BID, adjust as needed.  T2DM: Newly diagnosed. Started Levemir 12 units at night and Metformin 500 mg daily. Titrate Levemir and metformin as needed/tolerated.  Blood Pressure: Monitor for hypertension, consider adding ACE-I/ARB if persistently elevated.  Homeopathic Medicine: Limit homeopathic medicine, use only under supervision of physician.  2.  Labs / imaging needed at time of follow-up: TSH on PCP follow up, Hgb A1C 3 months after discharge  3.  Pending labs/ test needing follow-up: none  Follow-up Appointments: Follow-up Information    Schedule an appointment as soon as possible for a visit with Derwood Kaplan MD.   Why:  Your local PCP will resume your outpatient Physical Therapy   Contact information:   PCP  [phone]      Follow up with [facility] On 04/26/2016.   Why:  At 2:45 pm.    Contact information:   [address] [place] [phone]      Discharge Instructions: Discharge Instructions    Call MD for:  hives    Complete by:  As directed  Call MD for:  persistant dizziness or light-headedness    Complete by:  As directed      Call MD for:  persistant nausea and vomiting    Complete by:  As directed      Call MD for:  severe uncontrolled pain    Complete by:  As directed      Call MD for:  temperature >100.4    Complete by:  As directed      Diet - low sodium heart healthy    Complete by:  As directed      Increase activity slowly    Complete by:  As directed            Consultations:    Procedures Performed:  Dg Chest 2 View  04/14/2016  CLINICAL DATA:  Weakness beginning last night.  Initial encounter. EXAM: CHEST  2 VIEW COMPARISON:  None. FINDINGS: There is cardiomegaly without edema. Lungs are clear.  No pneumothorax or pleural effusion. Port-A-Cath is noted. Degenerative changes seen about the shoulders. IMPRESSION: Cardiomegaly without acute disease. Electronically Signed   By: Inge Rise M.D.   On: 04/14/2016 11:36   Ct Head Wo Contrast  04/14/2016  CLINICAL DATA:  Weakness. EXAM: CT HEAD WITHOUT CONTRAST TECHNIQUE: Contiguous axial images were obtained from the base of the skull through the vertex without intravenous contrast. COMPARISON:  None. FINDINGS: Mild cerebral atrophy. No acute intracranial abnormality. Specifically, no hemorrhage, hydrocephalus, mass lesion, acute infarction, or significant intracranial injury. No acute calvarial abnormality. Visualized paranasal sinuses and mastoids clear. Orbital soft tissues unremarkable. IMPRESSION: No acute intracranial abnormality. Electronically Signed   By: Rolm Baptise M.D.   On: 04/14/2016 11:56   Ct Abdomen Pelvis W Contrast  04/20/2016  CLINICAL DATA:  History of non-Hodgkin's lymphoma. Appendectomy, bilateral oophorectomy. EXAM: CT ABDOMEN AND PELVIS WITH CONTRAST TECHNIQUE: Multidetector CT imaging of the abdomen and pelvis was performed using the standard protocol following bolus administration of intravenous contrast. CONTRAST:  127m ISOVUE-300 IOPAMIDOL (ISOVUE-300) INJECTION 61% COMPARISON:  None. FINDINGS: Lower chest: Lung bases are clear. Hepatobiliary: Diffuse low-attenuation within liver. No focal hepatic lesion. Normal gallbladder. Pancreas: Pancreas is normal. No ductal dilatation. No pancreatic inflammation. Spleen: Normal spleen Adrenals/urinary tract: Adrenal glands and kidneys are normal. The ureters and bladder normal. Stomach/Bowel: Stomach, small bowel, and cecum are normal. The colon and rectosigmoid colon are normal. Vascular/Lymphatic: Abdominal aorta is normal caliber. There is no retroperitoneal lymphadenopathy. No pelvic lymphadenopathy. No inguinal lymphadenopathy. Reproductive: Post hysterectomy. Other: No free  fluid. Musculoskeletal: No aggressive osseous lesion. IMPRESSION: 1. No evidence of lymphadenopathy. 2. Normal spleen. 3. Hepatic steatosis. Electronically Signed   By: SSuzy BouchardM.D.   On: 04/20/2016 16:25   Mr Tibia Fibula Left Wo Contrast  04/17/2016  CLINICAL DATA:  Pain and swelling followup in the left lower extremity. Recent cat scratch injury. EXAM: MRI OF LOWER LEFT EXTREMITY WITHOUT CONTRAST TECHNIQUE: Multiplanar, multisequence MR imaging of the left lower extremity was performed. No intravenous contrast was administered. COMPARISON:  None. FINDINGS: Diffuse and fairly marked subcutaneous soft tissue swelling/ edema/ fluid ball in the entire left lower extremity. Similar but less significant findings involving the right lower extremity. No discrete fluid collection to suggest a drainable abscess. No findings for myofasciitis or pyomyositis. No findings for septic arthritis or osteomyelitis. IMPRESSION: Marked diffuse subcutaneous soft tissue swelling/edema suggesting cellulitis. No discrete drainable abscess, myofasciitis, septic arthritis or osteomyelitis. Electronically Signed   By: PMarijo SanesM.D.   On: 04/17/2016 12:57  2D Echo: TTE 04/15/2016 - Left ventricle: The cavity size was normal. There was severe  concentric hypertrophy. Systolic function was normal. The  estimated ejection fraction was in the range of 60% to 65%. Left  ventricular diastolic function parameters were normal.  Cardiac Cath: n/a  Admission HPI: Ms. Anesia Blackwell is a 75 year old woman with PMH of Non-Hodgkin's lymphoma in remission, pre-diabetes, and chronic joint pain (right shoulder, left hip) and LLE cellulitis who presented to the ED with AMS. History is obtained from sister as patient is minimally responsive. Patient is in town from [place] her townhouse. She was sleeping on an air-mattress and was found on the floor by the mattress this morning, thought secondary to a fall which  was not witnessed. Sister says patient was "foggy" this morning with limited communication and very lethargic. Patient was incontinent of bowel and bladder. Patient was reportedly at her baseline last night, communicating well and ambulatory. Sister called 911 after cleaning patient up per her request. She does not take any medications at home. Spoke to daughter over the phone, who reports that patient was scratched on her left leg by a cat about 1 week ago. Since then she seemed to be more sleepy. She was given homeopathic apis mellifica to treat her leg.  No ED provider note documented in EPIC. Per RN note, EMS reported that patient was hyperglycemic at 430 mg/dL, tachycardic, and A&Ox4. Labwork showed serum glucose 406, HCO3 18, anion gap 14, SCr 1.27 (0.9 three years ago), WBC 14.9, point of care trop 0.11, UA showed glucose >1000 large Hgb and 40 ketones, lactic acid 5.95.  A CXR showed cardiomegaly without acute pathology. CT Head showed mild cerebral atrophy without hemorrhage, hydrocephalus, mass lesion, or acute infarct. She was given 2 L NS, 5 units novolog, and Vancomycin.    Social Hx: Currently lives in [place], used to live in [place] after displaced by [employer]. Here to renovate townhome to resell.  Family Hx: Breast cancer in sister  Hospital Course by problem list: Active Problems:   Weakness   Bacteremia due to Streptococcus   Cellulitis of leg, left   T2DM (type 2 diabetes mellitus) ([place])   Cellulitis and abscess of lower extremity   Sepsis 2/2 Cellulitis with Streptococcus Bacteremia: Likely seeded after cat scratch at her LLE cellulitis. 1 of 2 blood cultures grew Group G Streptococcus species. Initially on Vancomycin and Cefazolin. Vancomycin was discontinued and Cefazolin alone was continued. Fevers, white count, and lactic acid improved initially, however cellulitis worsened and antibiotics were broadened back to Vancomycin and Azithromycin was added for  empiric Bartonella coverage. MRI on 6/24 without evidence of drainable abscess, myofascitis, or osteomyelitis. Due to minimal improvement in appearance and history of inguinal lymphoma, CT abdomen/pelvis was obtained for further evaluation which did not show sign of obstruction or lymphadenopathy. She completed 5 days Azithromycin and received 7 days antibiotics during hospital stay. Patient was hemodynamically stable with gradual improvement of cellulitis and negative repeat blood cultures, and per discussion with Infectious Disease, transitioned to oral Cephalexin and Doxycycline for an additional 10 days on discharge.  Afib RVR: [facility] Day #2 with rates from 130s to 180s. Rates improved after given Diltiazem IV push twice. Started on anticoagulation dosed Lovenox.  She was transitioned to Xarelto for anticoagulation and Metoprolol 25 mg BID was added for rate control and blood pressure. She had intermittent atrial fibrillation episodes on telemetry, discharged in sinus rhythm.  Type 2 DM: Hgb A1C  8.6. Newly diagnosed. Started Levemir 12 units at night and Metformin on discharge. Patient was able to administer insulin herself. She was given prescriptions for Levemir pens, needles, and monitor with strips. Will need close follow up and titration of insulin and Metformin.  Elevated TSH: TSH was elevated at 10.348 with free T4 slightly increased at 1.16. As this was in the acute setting, it was decided to repeat thyroid function on an outpatient basis for possible thyroid hormone replacement therapy.    Discharge Vitals:   BP 116/98 mmHg  Pulse 93  Temp(Src) 98.3 F (36.8 C) (Oral)  Resp 17  Ht 5' 2"  (1.575 m)  Wt 209 lb 7 oz (95 kg)  BMI 38.30 kg/m2  SpO2 98%  Discharge Labs:  Results for orders placed or performed during the hospital encounter of 04/14/16 (from the past 24 hour(s))  Glucose, capillary     Status: Abnormal   Collection Time: 04/21/16  8:04 AM  Result Value Ref  Range   Glucose-Capillary 189 (H) 65 - 99 mg/dL  Vancomycin, trough     Status: Abnormal   Collection Time: 04/21/16  9:51 AM  Result Value Ref Range   Vancomycin Tr 12 (L) 15 - 20 ug/mL  Glucose, capillary     Status: Abnormal   Collection Time: 04/21/16 12:00 PM  Result Value Ref Range   Glucose-Capillary 203 (H) 65 - 99 mg/dL    Signed: Zada Finders, MD 04/22/2016, 7:46 AM    Services Ordered on Discharge: none Equipment Ordered on Discharge: none

## 2016-04-16 NOTE — Progress Notes (Signed)
   Subjective: Patient awake and alert this morning. She is sitting on the bedside commode when seen as she developed watery diarrhea overnight. Enteric precautions were placed and stool collected for Cdiff quick scan which has returned negative. Patient denies any chest pain, palpitations, dyspnea, nausea, or vomiting. She feels a little stronger this morning. Family is present and have noticed improvement in her strength as well, though not at baseline. Patient and family updated with plan and questions answered.  Objective: Vital signs in last 24 hours: Filed Vitals:   04/15/16 1715 04/15/16 1727 04/15/16 2146 04/16/16 0540  BP: 136/69 134/65 148/64 132/63  Pulse: 132 128 79 73  Temp:  100.3 F (37.9 C) 97.9 F (36.6 C) 98.1 F (36.7 C)  TempSrc:  Oral    Resp: 22 22 20 16   Height:      Weight:      SpO2: 97% 98% 92% 94%   Weight change: -9 oz (-0.255 kg)  Intake/Output Summary (Last 24 hours) at 04/16/16 1029 Last data filed at 04/16/16 0919  Gross per 24 hour  Intake 2834.17 ml  Output    501 ml  Net 2333.17 ml   General: sitting up comfortably, pleasant Cardiac: RRR, no murmurs heard Pulm: clear to auscultation anteriorly, moving normal volumes of air Abd: soft, nondistended Ext: LLE cellulitis with improved borders, now with yellow colored purulent drainage from posterior leg and medial leg at site of scratch, still warm to touch Neuro: alert and oriented X3, conversing appropriately  Assessment/Plan: Active Problems:   Weakness   Bacteremia due to Streptococcus   Cellulitis of leg, left   T2DM (type 2 diabetes mellitus) (HCC)  Sepsis 2/2 Cellulitis with Streptococcus Bacteremia: Likely seeded after cat scratch at her LLE cellulitis. 1 of 2 blood cultures growing streptococcus species, pending sensitivities. Have discontinued Vancomycin and continuing Cefazolin. She is afebrile overnight. WBC normalized. Now has purulent drainage at LLE cellulitic site. -Continue  Cefazolin per pharmacy -Consult wound care -PT consulted -f/u blood culture sensitivity, adjust antibiotics as indicated -IV NS @ 125 mL/hr -CBC in AM  Afib RVR: Developed yesterday evening with rates from 130s to 180s. Rates improved after given Diltiazem IV push twice. Started on anticoagulation dosed Lovenox. TSH is elevated at 10.348 with free T4 slightly increased at 1.16. Likely set off from infection with possible underlying hypothyroidism. -Repeat EKG -Lovenox a/c dosing -May need to start Synthroid    T2DM: Hgb A1C 8.6. CBGs improved now. -SSI-M -Lantus 10 units qhs -Add Metformin on discharge  Diarrhea: Watery stool overnight. Cdiff is negative. Possible medication side-effect vs viral gastroenteritis. Patient reports ?gluten intolerance and has been taking probiotics at home as well. -IVF resuscitation -Monitor for improvement  Hypokalemia: K 2.9 this morning in setting of diarrhea.  -Repleting -f/u BMP in AM  Dispo: Disposition is deferred at this time, awaiting improvement of current medical problems.  Anticipated discharge in approximately 1-3 day(s).   The patient does not have a current PCP Laurey Morale, MD) and does need an [facility] hospital follow-up appointment after discharge.  The patient does have transportation limitations that hinder transportation to clinic appointments.    LOS: 2 days   Zada Finders, MD 04/16/2016, 10:29 AM

## 2016-04-16 NOTE — Progress Notes (Signed)
ANTICOAGULATION CONSULT NOTE - Initial Consult  Pharmacy Consult for Lovenox--> transitioning to Xarelto Indication: atrial fibrillation  Allergies  Allergen Reactions  . Penicillins     REACTION: hives    Patient Measurements: Height: 5\' 2"  (157.5 cm) Weight: 209 lb 7 oz (95 kg) IBW/kg (Calculated) : 50.1  Vital Signs: Temp: 97.4 F (36.3 C) (06/23 1431) BP: 122/56 mmHg (06/23 1431) Pulse Rate: 74 (06/23 1431)  Labs:  Recent Labs  04/14/16 1052  04/14/16 1824 04/15/16 0240 04/15/16 1625 04/16/16 0512  HGB 14.2  --   --  13.2  --  13.2  HCT 42.1  --   --  39.9  --  39.4  PLT 151  --   --  122*  --  131*  CREATININE 1.27*  < > 1.00 0.92  --  0.99  TROPONINI  --   --  0.16*  --  0.12*  --   < > = values in this interval not displayed.  Estimated Creatinine Clearance: 55.2 mL/min (by C-G formula based on Cr of 0.99).   Medical History: Past Medical History  Diagnosis Date  . Allergy   . Diabetes mellitus, type II ([place])   . Non Hodgkin's lymphoma ([place]) 1992     resolved   . Elevated BP   . Bursitis left hip  . Cat scratch of left lower leg 03/2016  . Cellulitis 03/2016    LEFT LOWER LEG  . Hyperlipidemia   . Bilateral leg edema 03/2016    Assessment: 75 year old female admitted 6/21 with possible sepsis secondary to cat scratch of lower leg, continues on cefazolin, is now in Afib, started Lovenox and is now transitioning to Xarelto  Last dose of Lovenox at 8 am, Scr normal  Goal of Therapy:  Anti-Xa level 0.6-1 units/ml 4hrs after LMWH dose given Monitor platelets by anticoagulation protocol: Yes   Plan:  Xarelto 20 mg po daily Follow up CBC  Thank you Anette Guarneri, PharmD [phone]  04/16/2016,4:02 PM

## 2016-04-17 ENCOUNTER — Inpatient Hospital Stay (HOSPITAL_COMMUNITY): Payer: 59

## 2016-04-17 DIAGNOSIS — L02419 Cutaneous abscess of limb, unspecified: Secondary | ICD-10-CM | POA: Insufficient documentation

## 2016-04-17 DIAGNOSIS — L03119 Cellulitis of unspecified part of limb: Secondary | ICD-10-CM

## 2016-04-17 LAB — GLUCOSE, CAPILLARY
GLUCOSE-CAPILLARY: 176 mg/dL — AB (ref 65–99)
GLUCOSE-CAPILLARY: 177 mg/dL — AB (ref 65–99)
Glucose-Capillary: 270 mg/dL — ABNORMAL HIGH (ref 65–99)
Glucose-Capillary: 258 mg/dL — ABNORMAL HIGH (ref 65–99)

## 2016-04-17 LAB — CBC
HCT: 35.8 % — ABNORMAL LOW (ref 36.0–46.0)
Hemoglobin: 11.9 g/dL — ABNORMAL LOW (ref 12.0–15.0)
MCH: 30.2 pg (ref 26.0–34.0)
MCHC: 33.2 g/dL (ref 30.0–36.0)
MCV: 90.9 fL (ref 78.0–100.0)
PLATELETS: 123 10*3/uL — AB (ref 150–400)
RBC: 3.94 MIL/uL (ref 3.87–5.11)
RDW: 14.7 % (ref 11.5–15.5)
WBC: 5.8 10*3/uL (ref 4.0–10.5)

## 2016-04-17 LAB — CULTURE, BLOOD (ROUTINE X 2)

## 2016-04-17 LAB — BASIC METABOLIC PANEL
ANION GAP: 6 (ref 5–15)
BUN: 9 mg/dL (ref 6–20)
CALCIUM: 7.9 mg/dL — AB (ref 8.9–10.3)
CO2: 22 mmol/L (ref 22–32)
Chloride: 110 mmol/L (ref 101–111)
Creatinine, Ser: 0.74 mg/dL (ref 0.44–1.00)
GFR calc Af Amer: >60 mL/min (ref >60)
Glucose, Bld: 163 mg/dL — ABNORMAL HIGH (ref 65–99)
POTASSIUM: 3.3 mmol/L — AB (ref 3.5–5.1)
SODIUM: 138 mmol/L (ref 135–145)

## 2016-04-17 LAB — URINE CULTURE: Culture: 10000 — AB

## 2016-04-17 MED ORDER — POTASSIUM CHLORIDE 20 MEQ/15ML (10%) PO SOLN
40.0000 meq | Freq: Two times a day (BID) | ORAL | Status: AC
  Administered 2016-04-17 (×2): 40 meq via ORAL
  Filled 2016-04-17 (×2): qty 30

## 2016-04-17 MED ORDER — AZITHROMYCIN 500 MG PO TABS
500.0000 mg | ORAL_TABLET | Freq: Every day | ORAL | Status: AC
  Administered 2016-04-17: 500 mg via ORAL
  Filled 2016-04-17: qty 1

## 2016-04-17 MED ORDER — DEXTROSE 5 % IV SOLN
2.0000 g | INTRAVENOUS | Status: DC
  Administered 2016-04-18: 2 g via INTRAVENOUS
  Filled 2016-04-17 (×2): qty 2

## 2016-04-17 MED ORDER — AZITHROMYCIN 500 MG PO TABS
250.0000 mg | ORAL_TABLET | Freq: Every day | ORAL | Status: AC
  Administered 2016-04-18 – 2016-04-21 (×4): 250 mg via ORAL
  Filled 2016-04-17 (×4): qty 1

## 2016-04-17 MED ORDER — INSULIN GLARGINE 100 UNIT/ML ~~LOC~~ SOLN
12.0000 [IU] | Freq: Every day | SUBCUTANEOUS | Status: DC
  Administered 2016-04-17 – 2016-04-20 (×4): 12 [IU] via SUBCUTANEOUS
  Filled 2016-04-17 (×5): qty 0.12

## 2016-04-17 NOTE — Progress Notes (Addendum)
   Subjective: This morning, she was sitting at bedside in good spirits. She enjoyed the bacon and denies any fever, chills, difficulty breathing, chest pain. She does however note worsening erythema and swelling of her LLE associated with pain on walking.  Objective: Vital signs in last 24 hours: Filed Vitals:   04/15/16 2146 04/16/16 0540 04/16/16 1431 04/17/16 0527  BP: 148/64 132/63 122/56 136/60  Pulse: 79 73 74 73  Temp: 97.9 F (36.6 C) 98.1 F (36.7 C) 97.4 F (36.3 C) 97.8 F (36.6 C)  TempSrc:      Resp: 20 16 18 20   Height:      Weight:      SpO2: 92% 94% 100% 99%   Weight change:   Intake/Output Summary (Last 24 hours) at 04/17/16 1046 Last data filed at 04/17/16 1013  Gross per 24 hour  Intake    720 ml  Output   1400 ml  Net   -680 ml   General: sitting up comfortably, pleasant Cardiac: RRR, no murmurs heard Pulm: clear to auscultation anteriorly, moving normal volumes of air Abd: soft, nondistended Ext: LLE cellulitis extended beyond the previously marked borders, no drainage noted, mildly warm to touch, no pain with dorsiflexion, painful inguinal lymphadenopathy Neuro: alert and oriented X3, conversing appropriately  Assessment/Plan: Active Problems:   Weakness   Bacteremia due to Streptococcus   Cellulitis of leg, left   T2DM (type 2 diabetes mellitus) ([place])  Cellulitis complicated by Streptococcal Bacteremia: Likely seeded after cat scratch at her LLE cellulitis. 1 of 2 blood cultures growing Streptococcus Group G. Transitioned to ceftriaxone yesterday in the setting of acute diarrhea. No fevers overnight though physical exam findings are concerning for worsening infection. -Order MRI study to assess for abscess which may warrant surgical evaluation.   -Continue ceftriaxone [id number]] -Start azithromycin for empiric coverage of Bartonella [place] 1/5] -Consult wound care -PT consulted -Repeat blood cultures today to document clearance -Follow BMET and  CBC in AM  Paroxysmal atrial fibrillation: Rates 70s over last 24 hours and appears to be in sinus rhythm on telemetry. Started on anticoagulation dosed Lovenox given CHADSVASC score 4. TSH is elevated at 10.348 with free T4 slightly increased at 1.16 which is suggestive of euthyroid sick syndrome. -Continue Xarelto -Repeat thyroid testing as outpatient confirm resolution of acute illness   Poorly controlled type 2 diabetes: Hgb A1C 8.6. CBGs trending 200s throughout yesterday.  -Continue SSI-M -Increase Lantus to 12 units tonight -Add Metformin on discharge  Diarrhea: Suspect diet-related as Cdiff is negative as she reports ?gluten intolerance and has been taking probiotics at home. Other possibility is medication-related. -Continue gluten free diet  Hypokalemia: K 3.3, improved from 2.9 yesterday in the setting of diarrhea.  -Recheck BMP in AM -Give K 35mEq twice today  Dispo: Disposition is deferred at this time, awaiting improvement of current medical problems.  Anticipated discharge in approximately 1-3 day(s).   The patient does not have a current PCP Laurey Morale, MD) and does need an [facility] hospital follow-up appointment after discharge.  The patient does have transportation limitations that hinder transportation to clinic appointments.    LOS: 3 days   Riccardo Dubin, MD 04/17/2016, 10:46 AM

## 2016-04-17 NOTE — Progress Notes (Signed)
Internal Medicine Attending:   I saw and examined the patient. I reviewed the resident's note and I agree with the resident's findings and plan as documented in the resident's note.  Diabetic with severe cellulitis of the left lower extremity following a cat scratch. Erythema worse today, spreading beyond at the marked borders, also noted to have tender left inguinal lymphadenitis. We got an MRI to rule out fasciitis or discrete abscess. Would continue ceftriaxone to treat Group G streptococcus, repeat blood cultures today to ensure resolution of bacteremia. Would add azithromycin to cover Bartonella as the cellulitis may be polymicrobial.

## 2016-04-18 LAB — BASIC METABOLIC PANEL
ANION GAP: 8 (ref 5–15)
BUN: 6 mg/dL (ref 6–20)
CALCIUM: 8.6 mg/dL — AB (ref 8.9–10.3)
CHLORIDE: 106 mmol/L (ref 101–111)
CO2: 25 mmol/L (ref 22–32)
Creatinine, Ser: 0.67 mg/dL (ref 0.44–1.00)
GFR calc Af Amer: >60 mL/min (ref >60)
GFR calc non Af Amer: >60 mL/min (ref >60)
GLUCOSE: 124 mg/dL — AB (ref 65–99)
Potassium: 3.7 mmol/L (ref 3.5–5.1)
Sodium: 139 mmol/L (ref 135–145)

## 2016-04-18 LAB — GLUCOSE, CAPILLARY
GLUCOSE-CAPILLARY: 231 mg/dL — AB (ref 65–99)
GLUCOSE-CAPILLARY: 223 mg/dL — AB (ref 65–99)
Glucose-Capillary: 147 mg/dL — ABNORMAL HIGH (ref 65–99)
Glucose-Capillary: 163 mg/dL — ABNORMAL HIGH (ref 65–99)
Glucose-Capillary: 236 mg/dL — ABNORMAL HIGH (ref 65–99)

## 2016-04-18 LAB — PROCALCITONIN: Procalcitonin: 8.1 ng/mL

## 2016-04-18 MED ORDER — VANCOMYCIN HCL 10 G IV SOLR
2000.0000 mg | Freq: Once | INTRAVENOUS | Status: AC
  Administered 2016-04-18: 2000 mg via INTRAVENOUS
  Filled 2016-04-18: qty 2000

## 2016-04-18 MED ORDER — INSULIN ASPART 100 UNIT/ML ~~LOC~~ SOLN
5.0000 [IU] | Freq: Once | SUBCUTANEOUS | Status: AC
  Administered 2016-04-18: 5 [IU] via SUBCUTANEOUS

## 2016-04-18 MED ORDER — VANCOMYCIN HCL IN DEXTROSE 1-5 GM/200ML-% IV SOLN
1000.0000 mg | Freq: Two times a day (BID) | INTRAVENOUS | Status: DC
Start: 2016-04-18 — End: 2016-04-21
  Administered 2016-04-18 – 2016-04-20 (×5): 1000 mg via INTRAVENOUS
  Filled 2016-04-18 (×7): qty 200

## 2016-04-18 NOTE — Progress Notes (Signed)
   Subjective: Patient is resting in bed comfortably. She feels she is getting better. She is tolerating food and was able to ambulate to the bathroom. She denies any fever, chills, chest pain, palpitations, dyspnea, nausea, vomiting, or diarrhea. She is unsure if her LLE erythema is worsening. Spoke with her daughter, Marisa Davenport, over the phone. Patient and daughter updated and questions answered.   Objective: Vital signs in last 24 hours: Filed Vitals:   04/17/16 0527 04/17/16 1340 04/17/16 2108 04/18/16 0558  BP: 136/60 183/69 156/76 158/60  Pulse: 73 91 77 78  Temp: 97.8 F (36.6 C) 98.5 F (36.9 C) 98 F (36.7 C) 97.5 F (36.4 C)  TempSrc:      Resp: 20 19 16 17   Height:      Weight:      SpO2: 99% 96% 98% 99%   Weight change:   Intake/Output Summary (Last 24 hours) at 04/18/16 1153 Last data filed at 04/18/16 0745  Gross per 24 hour  Intake    560 ml  Output   1300 ml  Net   -740 ml   General: resting in bed comfortably, pleasant Cardiac: RRR, no murmurs heard Pulm: clear to auscultation anteriorly, moving normal volumes of air Abd: soft, nondistended Ext: LLE cellulitis extended beyond the previously marked borders, no active drainage noted, mildly warm to touch and tender, +2 pitting edema, no pain with dorsiflexion, no painful inguinal lymphadenopathy today Neuro: alert and oriented X3, conversing appropriately  Assessment/Plan: Active Problems:   Weakness   Bacteremia due to Streptococcus   Cellulitis of leg, left   T2DM (type 2 diabetes mellitus) ([place])   Cellulitis and abscess of lower extremity  Cellulitis complicated by Streptococcal Bacteremia: Likely seeded after cat scratch at her LLE cellulitis. 1 of 2 blood cultures growing Streptococcus Group G. Transitioned to ceftriaxone in the setting of acute diarrhea. No fevers overnight though physical exam findings are concerning for worsening infection since my last exam. MRI on 6/24 without evidence of drainable  abscess, myofascitis, or osteomyelitis.  -Continue ceftriaxone [Day 4] -Continue Azithromycin for empiric coverage of Bartonella [Day 2/5] -Add Vancomycin per pharmacy due to worsening cellulitis -Continue Wound Care per [place] recommendations -PT consulted -f/u repeat blood cultures (6/24) to document clearance -Follow BMET and CBC in AM  Paroxysmal atrial fibrillation: CHADSVASC score 4. TSH is elevated at 10.348 with free T4 slightly increased at 1.16 which is suggestive of euthyroid sick syndrome. Started on Xarelto this admission. -Continue Xarelto, defer to PCP on duration of therapy, likely indefinite -Repeat thyroid testing as outpatient confirm resolution of acute illness   Poorly controlled type 2 diabetes: Hgb A1C 8.6. CBGs trending 120-160s this morning.  -Continue SSI-M -Lantus 12 units -Add Metformin on discharge  Diarrhea: Now resolved. Suspect diet-related as Cdiff is negative and she reports ?gluten intolerance and has been taking probiotics at home. -Continue gluten free diet  Hypokalemia: Resolved -Recheck BMP in AM -Replete as needed  Dispo: Disposition is deferred at this time, awaiting improvement of current medical problems.  Anticipated discharge in approximately 1-3 day(s).   The patient does not have a current PCP Laurey Morale, MD) and does need an [facility] hospital follow-up appointment after discharge.  The patient does have transportation limitations that hinder transportation to clinic appointments.    LOS: 4 days   Marisa Finders, MD 04/18/2016, 11:53 AM

## 2016-04-18 NOTE — Progress Notes (Signed)
MD on call notified that patient's SBP is elevated in 170s, and pt is running low grade fever. MD ordered new blood/urine cultures to be collected.  No new orders regarding BP.  Will continue to monitor patient.

## 2016-04-18 NOTE — Progress Notes (Signed)
MD on call notified that patient's HR jumped up to 170s with ambulation, and went back down to 80s-90s with rest.  No new orders given.  Will continue to monitor patient.

## 2016-04-18 NOTE — Progress Notes (Signed)
Pharmacy Antibiotic Note Zakyrah Stoneman is a 75 y.o. female admitted on 04/14/2016 with cellulitis and streptococcal bacteremia following a cat scratch. Current being treated with cephalosporin (ancef now ceftriaxone) but physical exam is concerning for worsening cellulitis. Pharmacy consulted to add vancomycin.   Plan: 1. Vancomycin 2000 mg loading dose x 1 now, followed by 1000 mg q 12 hours 2. VT as SS; goal trough 10-15 3. SCr Q 72H while on vancomycin  4. Continue ceftriaxone 2 grams IV daily   Height: 5\' 2"  (157.5 cm) Weight: 209 lb 7 oz (95 kg) IBW/kg (Calculated) : 50.1  Temp (24hrs), Avg:98 F (36.7 C), Min:97.5 F (36.4 C), Max:98.5 F (36.9 C)   Recent Labs Lab 04/14/16 1052 04/14/16 1257  04/14/16 1824 04/15/16 0240 04/15/16 0252 04/15/16 0456 04/15/16 1155 04/16/16 0512 04/17/16 0541 04/18/16 0411  WBC 14.9*  --   --   --  9.0  --   --   --  8.3 5.8  --   CREATININE 1.27*  --   < > 1.00 0.92  --   --   --  0.99 0.74 0.67  LATICACIDVEN  --  5.95*  --   --   --  2.2* 2.8* 2.1*  --   --   --   < > = values in this interval not displayed.  Estimated Creatinine Clearance: 68.3 mL/min (by C-G formula based on Cr of 0.67).    Allergies  Allergen Reactions  . Penicillins     REACTION: hives    Antimicrobials this admission: 6/21 Ancef >> 6/23  6/24 Zithromax > 6/29  6/23 Ceftriaxone >>  6/25 vancomycin  >>   Dose adjustments this admission: n/a  Microbiology results: 6/24 BCx: px 6/21 BCx: streptococcus group G pan S 6/21 UCx: E.coli  6/21 MRSA PCR: neg   Thank you for allowing pharmacy to be a part of this patient's care.  Vincenza Hews, PharmD, BCPS 04/18/2016, 11:55 AM Pager: [phone]

## 2016-04-19 DIAGNOSIS — B954 Other streptococcus as the cause of diseases classified elsewhere: Secondary | ICD-10-CM

## 2016-04-19 DIAGNOSIS — I480 Paroxysmal atrial fibrillation: Secondary | ICD-10-CM

## 2016-04-19 LAB — CULTURE, BLOOD (ROUTINE X 2): CULTURE: NO GROWTH

## 2016-04-19 LAB — CBC
HCT: 35.8 % — ABNORMAL LOW (ref 36.0–46.0)
HEMOGLOBIN: 12 g/dL (ref 12.0–15.0)
MCH: 29.4 pg (ref 26.0–34.0)
MCHC: 33.5 g/dL (ref 30.0–36.0)
MCV: 87.7 fL (ref 78.0–100.0)
PLATELETS: 178 10*3/uL (ref 150–400)
RBC: 4.08 MIL/uL (ref 3.87–5.11)
RDW: 14.2 % (ref 11.5–15.5)
WBC: 5.9 10*3/uL (ref 4.0–10.5)

## 2016-04-19 LAB — BASIC METABOLIC PANEL
Anion gap: 8 (ref 5–15)
BUN: 8 mg/dL (ref 6–20)
CHLORIDE: 104 mmol/L (ref 101–111)
CO2: 25 mmol/L (ref 22–32)
Calcium: 8.3 mg/dL — ABNORMAL LOW (ref 8.9–10.3)
Creatinine, Ser: 0.73 mg/dL (ref 0.44–1.00)
Glucose, Bld: 195 mg/dL — ABNORMAL HIGH (ref 65–99)
POTASSIUM: 3.2 mmol/L — AB (ref 3.5–5.1)
SODIUM: 137 mmol/L (ref 135–145)

## 2016-04-19 LAB — GLUCOSE, CAPILLARY
GLUCOSE-CAPILLARY: 187 mg/dL — AB (ref 65–99)
Glucose-Capillary: 295 mg/dL — ABNORMAL HIGH (ref 65–99)
Glucose-Capillary: 274 mg/dL — ABNORMAL HIGH (ref 65–99)
Glucose-Capillary: 175 mg/dL — ABNORMAL HIGH (ref 65–99)

## 2016-04-19 MED ORDER — POTASSIUM CHLORIDE 20 MEQ/15ML (10%) PO SOLN
60.0000 meq | Freq: Once | ORAL | Status: AC
  Administered 2016-04-19: 60 meq via ORAL
  Filled 2016-04-19: qty 45

## 2016-04-19 MED ORDER — METOPROLOL TARTRATE 25 MG PO TABS
25.0000 mg | ORAL_TABLET | Freq: Two times a day (BID) | ORAL | Status: DC
  Administered 2016-04-19 – 2016-04-21 (×5): 25 mg via ORAL
  Filled 2016-04-19 (×5): qty 1

## 2016-04-19 NOTE — Progress Notes (Signed)
Inpatient Diabetes Program Recommendations  AACE/ADA: New Consensus Statement on Inpatient Glycemic Control (2015)  Target Ranges:  Prepandial:   less than 140 mg/dL      Peak postprandial:   less than 180 mg/dL (1-2 hours)      Critically ill patients:  140 - 180 mg/dL   Inpatient Diabetes Program Recommendations:  Spoke with Dr. Posey Pronto to verify patient discharge plans to go home on insulin.  Spoke with RN to start teaching insulin administration and allow patient to start giving her injections while here in the hospital and review insulin pen administration along with videos on insulin injections. Spoke with case manager Debbie to review if patient's Medicare will cover Lantus and suggest changing to 70/30 insulin if not covered.  Thank you, Nani Gasser. Paulette Rockford, RN, MSN, CDE Inpatient Glycemic Control Team Team Pager [phone] (8am-5pm) 04/19/2016 11:38 AM

## 2016-04-19 NOTE — Care Management Note (Signed)
Case Management Note  Patient Details  Name: Marisa Davenport MRN: [id number] Date of Birth: February 07, 1941  Subjective/Objective:                 PAtient in town to renovate her townhouse. She is from [place] where she lives with her daughter. Sepsis AMS, IV Abx. Independent PTA. Patient states that she just finished OP PT at home in [place] for bursitis to to hip. She states that she would like to see her PCP at home and have them set up the same OP PT. She states that she will be staying with her sister here only for 2-3 days after her DC from the hospital. Patient's home address is [address] [place] [address].  PCP Dr Casimiro Needle [phone] [address] [place]   Action/Plan:  No CM needs at this time.   Expected Discharge Date:                  Expected Discharge Plan:  Home/Self Care  In-House Referral:     Discharge planning Services  CM Consult  Post Acute Care Choice:    Choice offered to:     DME Arranged:    DME Agency:     HH Arranged:    HH Agency:     Status of Service:  In process, will continue to follow  If discussed at Long Length of Stay Meetings, dates discussed:    Additional Comments:  Carles Collet, RN 04/19/2016, 1:34 PM

## 2016-04-19 NOTE — Progress Notes (Signed)
Patient seen and examined. Case d/w residents in detail. I agree with findings and plan as documented in Dr. Serita Grit note.  Patient feels better but still with persistent redness and swelling L LE with assoc increased warmth and tenderness. MRI L LE with no evidence of osteo or myofascitis. Will c/w vancomycin per pharmacy to cover group G strep (sensitive to vanco) and d/c ceftriaxone as patient worsened on ceftriaxone alone. Also started on azithromycin to cover bartonella.  Patient with new onset afib here. Will c/w a/c with xarelto and start on metoprolol today for rate control (intermittent tachycardia episodes)

## 2016-04-19 NOTE — Discharge Instructions (Addendum)
Please complete the following antibiotics: 1. Keflex 500 mg every 6 hours for 10 days 2. Doxycycline 100 mg twice a day for 10 days  Please continue to take Xarelto 20 mg daily. This is the blood thinner for your Atrial Fibrillation. Please monitor for any signs of bleeding.  Please continue to take Metoprolol 25 mg twice a day. This helps control your heart rate and blood pressure.  Please start taking Insulin Levemir as prescribed at night. Please also start taking Metformin 500 mg daily. Follow up with your doctor to increase the Metformin dose and adjust your insulin as needed.    Cellulitis Cellulitis is an infection of the skin and the tissue beneath it. The infected area is usually red and tender. Cellulitis occurs most often in the arms and lower legs.  CAUSES  Cellulitis is caused by bacteria that enter the skin through cracks or cuts in the skin. The most common types of bacteria that cause cellulitis are staphylococci and streptococci. SIGNS AND SYMPTOMS   Redness and warmth.  Swelling.  Tenderness or pain.  Fever. DIAGNOSIS  Your health care provider can usually determine what is wrong based on a physical exam. Blood tests may also be done. TREATMENT  Treatment usually involves taking an antibiotic medicine. HOME CARE INSTRUCTIONS   Take your antibiotic medicine as directed by your health care provider. Finish the antibiotic even if you start to feel better.  Keep the infected arm or leg elevated to reduce swelling.  Apply a warm cloth to the affected area up to 4 times per day to relieve pain.  Take medicines only as directed by your health care provider.  Keep all follow-up visits as directed by your health care provider. SEEK MEDICAL CARE IF:   You notice red streaks coming from the infected area.  Your red area gets larger or turns dark in color.  Your bone or joint underneath the infected area becomes painful after the skin has healed.  Your infection  returns in the same area or another area.  You notice a swollen bump in the infected area.  You develop new symptoms.  You have a fever. SEEK IMMEDIATE MEDICAL CARE IF:   You feel very sleepy.  You develop vomiting or diarrhea.  You have a general ill feeling (malaise) with muscle aches and pains.   This information is not intended to replace advice given to you by your health care provider. Make sure you discuss any questions you have with your health care provider.   Document Released: 07/21/2005 Document Revised: 07/02/2015 Document Reviewed: 12/27/2011 Elsevier Interactive Patient Education 2016 [place] on my medicine - XARELTO (Rivaroxaban)  This medication education was reviewed with me or my healthcare representative as part of my discharge preparation.   Why was Xarelto prescribed for you? Xarelto was prescribed for you to reduce the risk of a blood clot forming that can cause a stroke if you have a medical condition called atrial fibrillation (a type of irregular heartbeat).  What do you need to know about xarelto ? Take your Xarelto ONCE DAILY at the same time every day with your evening meal. If you have difficulty swallowing the tablet whole, you may crush it and mix in applesauce just prior to taking your dose.  Take Xarelto exactly as prescribed by your doctor and DO NOT stop taking Xarelto without talking to the doctor who prescribed the medication.  Stopping without other stroke prevention  medication to take the place of Xarelto may increase your risk of developing a clot that causes a stroke.  Refill your prescription before you run out.  After discharge, you should have regular check-up appointments with your healthcare provider that is prescribing your Xarelto.  In the future your dose may need to be changed if your kidney function or weight changes by a significant amount.  What do you do if you miss a dose? If you are  taking Xarelto ONCE DAILY and you miss a dose, take it as soon as you remember on the same day then continue your regularly scheduled once daily regimen the next day. Do not take two doses of Xarelto at the same time or on the same day.   Important Safety Information A possible side effect of Xarelto is bleeding. You should call your healthcare provider right away if you experience any of the following: ? Bleeding from an injury or your nose that does not stop. ? Unusual colored urine (red or dark brown) or unusual colored stools (red or black). ? Unusual bruising for unknown reasons. ? A serious fall or if you hit your head (even if there is no bleeding).  Some medicines may interact with Xarelto and might increase your risk of bleeding while on Xarelto. To help avoid this, consult your healthcare provider or pharmacist prior to using any new prescription or non-prescription medications, including herbals, vitamins, non-steroidal anti-inflammatory drugs (NSAIDs) and supplements.  This website has more information on Xarelto: [email or web address]

## 2016-04-19 NOTE — Progress Notes (Signed)
Subjective: Patient is sitting up in bed comfortably. She feels she is making progress, starting to feel better, but still no improvement in LLE cellulitis. She was able to walk with PT today with HR remaining below 110 bpm. BPs have been elevated overnight to the [id number] systolic. She denies any chest pain, palpitations, dyspnea, nausea, or vomiting. Tmax was 100.3 F overnight, repeat blood cultures and urine culture were collected.  Objective: Vital signs in last 24 hours: Filed Vitals:   04/18/16 1316 04/18/16 2127 04/19/16 0226 04/19/16 0520  BP: 152/85 175/63 159/79 164/84  Pulse: 120 84 74 78  Temp: 97.4 F (36.3 C) 100.3 F (37.9 C)  98.1 F (36.7 C)  TempSrc:      Resp: 18 17 17 18   Height:      Weight:      SpO2: 98% 96% 96% 98%   Weight change:   Intake/Output Summary (Last 24 hours) at 04/19/16 1037 Last data filed at 04/19/16 0520  Gross per 24 hour  Intake    320 ml  Output   1350 ml  Net  -1030 ml   General: sitting up in bed comfortably, pleasant Cardiac: RRR, no murmurs heard Pulm: clear to auscultation, moving normal volumes of air Abd: soft, nondistended Ext: LLE cellulitis extended beyond the previously marked borders, no active drainage noted, mildly warm to touch and tender, +2 pitting edema Neuro: alert and oriented X3, conversing appropriately  Assessment/Plan: Active Problems:   Weakness   Bacteremia due to Streptococcus   Cellulitis of leg, left   T2DM (type 2 diabetes mellitus) ([facility])   Cellulitis and abscess of lower extremity  Cellulitis complicated by Streptococcal Bacteremia: Likely seeded after cat scratch at her LLE cellulitis. 1 of 2 blood cultures growing Streptococcus Group G. MRI on 6/24 without evidence of drainable abscess, myofascitis, or osteomyelitis. Cellulitis without improvement compared to yesterday. Current antibiotics are now Vancomycin and Azithromycin. -Discontinue ceftriaxone [Day 4] -Continue Azithromycin for empiric  coverage of Bartonella [Day 3/5] -Continue Vancomycin per pharmacy due to worsening cellulitis -Continue Wound Care per [place] recommendations -PT recommending HHPT, appreciate assistance -f/u repeat blood cultures (6/24) to document clearance, will likely need PICC for continued outpatient IV antibiotics -Follow BMET and CBC in AM  Paroxysmal atrial fibrillation: CHADSVASC score 4. TSH is elevated at 10.348 with free T4 slightly increased at 1.16 which is suggestive of euthyroid sick syndrome. Started on Xarelto this admission. Has intermittent episodes of tachycardia with rates up to 170s. Will add Metoprolol for rate control and blood pressure and will need to monitor heart rate as she has had some bradycardia on review of telemetry. -Continue Xarelto, defer to PCP on duration of therapy, likely indefinite -Start Metoprolol 25 mg BID -Consider Cardiology consult if continued tachy-brady episodes -Repeat thyroid testing as outpatient confirm resolution of acute illness   Poorly controlled type 2 diabetes: Hgb A1C 8.6. CBGs trending 120-160s this morning.  -Continue SSI-M -Lantus 12 units -Add Metformin on discharge  Diarrhea: Now resolved. Suspect diet-related as Cdiff is negative and she reports ?gluten intolerance and has been taking probiotics at home. -Continue gluten free diet  Hypokalemia: 3.2 this am, repleting -Recheck BMP in AM -Replete as needed  Dispo: Disposition is deferred at this time, awaiting improvement of current medical problems.  Anticipated discharge in approximately 1-3 day(s).   The patient does not have a current PCP Laurey Morale, MD) and does need an [facility] hospital follow-up appointment after discharge.  The patient does have  transportation limitations that hinder transportation to clinic appointments.    LOS: 5 days   Zada Finders, MD 04/19/2016, 10:37 AM

## 2016-04-19 NOTE — Progress Notes (Signed)
Inpatient Diabetes Program Recommendations  AACE/ADA: New Consensus Statement on Inpatient Glycemic Control (2015)  Target Ranges:  Prepandial:   less than 140 mg/dL      Peak postprandial:   less than 180 mg/dL (1-2 hours)      Critically ill patients:  140 - 180 mg/dL   Results for HELYNE, PESCHEL (MRN [id number]) as of 04/19/2016 11:16  Ref. Range 04/18/2016 07:47 04/18/2016 11:34 04/18/2016 16:20 04/18/2016 20:45 04/19/2016 07:52  Glucose-Capillary Latest Ref Range: 65-99 mg/dL 163 (H) 231 (H) 223 (H) 236 (H) 187 (H)   Review of Glycemic Control  Current orders for Inpatient glycemic control: Lantus 12 units QHS, Novolog 0-15 units TID with meals  Inpatient Diabetes Program Recommendations: Insulin - Meal Coverage: While inpatient, please consider ordering Novolog 3 units TID with meals if patient is eating at least 50% of meals.  Insulin-Correction: Please consider ordering Novolog bedtime correction scale.  Thanks, Barnie Alderman, RN, MSN, CDE Diabetes Coordinator Inpatient Diabetes Program [phone] (Team Pager from [place] to [place]) [phone] ([place] office) [phone] [facility] office) [phone] [facility] office)

## 2016-04-19 NOTE — Care Management Important Message (Signed)
Important Message  Patient Details  Name: Marisa Davenport MRN: [id number] Date of Birth: Feb 23, 1941   Medicare Important Message Given:  Yes    Nathen May 04/19/2016, 10:47 AM

## 2016-04-19 NOTE — Progress Notes (Signed)
[place] for  Xarelto Indication: atrial fibrillation  Allergies  Allergen Reactions  . Penicillins     REACTION: hives    Patient Measurements: Height: 5\' 2"  (157.5 cm) Weight: 209 lb 7 oz (95 kg) IBW/kg (Calculated) : 50.1  Vital Signs: Temp: 98.1 F (36.7 C) (06/26 0520) BP: 133/66 mmHg (06/26 1057) Pulse Rate: 89 (06/26 1057)  Labs:  Recent Labs  04/17/16 0541 04/18/16 0411 04/19/16 0502  HGB 11.9*  --  12.0  HCT 35.8*  --  35.8*  PLT 123*  --  178  CREATININE 0.74 0.67 0.73    Estimated Creatinine Clearance: 68.3 mL/min (by C-G formula based on Cr of 0.73).   Assessment: 75 year old female admitted 6/21 with possible sepsis secondary to cat scratch of lower leg, continues on antibiotics. With new onset Afib, started Lovenox and now transitioned to Xarelto. SCr 0.73, CrCl >1mL/min. Hgb 12, plts 178- stable, no bleeding noted.   Goal of Therapy:  Monitor platelets by anticoagulation protocol: Yes   Plan:  Xarelto 20 mg po qsupper Follow up CBC Pharmacy to follow peripherally  Jovani Colquhoun D. Eusevio Schriver, PharmD, BCPS Clinical Pharmacist Pager: [phone] 04/19/2016 12:14 PM

## 2016-04-19 NOTE — Progress Notes (Signed)
Physical Therapy Treatment Patient Details Name: Marisa Davenport MRN: [id number] DOB: 1941/01/13 Today's Date: 04/19/2016    History of Present Illness Pt adm with sepsis due to cellulitis from cat scratch on leg. Pt developed afib with RVR and diarrhea. Found to be diabetic as well. PMH - lymphoma in remission    PT Comments    Patient is progressing toward mobility goals. Ambulated with no c/o pain in L LE. HR remained below 110 with ambulation. Current plan remains appropriate.   Follow Up Recommendations  Home health PT     Equipment Recommendations  Other (comment) (rollator)    Recommendations for Other Services       Precautions / Restrictions Precautions Precautions: Fall Restrictions Weight Bearing Restrictions: No    Mobility  Bed Mobility               General bed mobility comments: sitting EOB upon arrival  Transfers Overall transfer level: Needs assistance Equipment used: None;1 person hand held assist Transfers: Sit to/from Stand Sit to Stand: Min guard         General transfer comment: min guard for safety; HHA upon standing for balance  Ambulation/Gait Ambulation/Gait assistance: Supervision Ambulation Distance (Feet): 130 Feet Assistive device: Rolling walker (2 wheeled) Gait Pattern/deviations: Shuffle;Step-through pattern;Decreased stance time - left;Trunk flexed;Wide base of support Gait velocity: decr   General Gait Details: shuffle gait at times; cues for increased bilat step length, posture, and position of RW; HR remained below 110 during mobility   Stairs            Wheelchair Mobility    Modified Rankin (Stroke Patients Only)       Balance     Sitting balance-Leahy Scale: Good       Standing balance-Leahy Scale: Poor                      Cognition Arousal/Alertness: Awake/alert Behavior During Therapy: WFL for tasks assessed/performed Overall Cognitive Status: Within Functional Limits for tasks  assessed                      Exercises      General Comments General comments (skin integrity, edema, etc.): pt with no c/o pain L LE with mobility      Pertinent Vitals/Pain Pain Assessment: Faces Faces Pain Scale: Hurts little more Pain Location: back with mobility Pain Descriptors / Indicators: Sore Pain Intervention(s): Monitored during session;Repositioned    Home Living                      Prior Function            PT Goals (current goals can now be found in the care plan section) Acute Rehab PT Goals Patient Stated Goal: go to sister's house on d/c PT Goal Formulation: With patient Time For Goal Achievement: 04/23/16 Potential to Achieve Goals: Good    Frequency  Min 3X/week    PT Plan      Co-evaluation             End of Session Equipment Utilized During Treatment: Gait belt Activity Tolerance: Other (comment) (Limited by diarrhea) Patient left: in bed;with call bell/phone within reach;with bed alarm set     Time: [id number] PT Time Calculation (min) (ACUTE ONLY): 28 min  Charges:  $Gait Training: 8-22 mins $Therapeutic Activity: 8-22 mins  G Codes:      [name], PTA Pager: [phone]   04/19/2016, 10:28 AM

## 2016-04-20 ENCOUNTER — Encounter (HOSPITAL_COMMUNITY): Payer: Self-pay | Admitting: Radiology

## 2016-04-20 ENCOUNTER — Inpatient Hospital Stay (HOSPITAL_COMMUNITY): Payer: 59

## 2016-04-20 LAB — CBC
HEMATOCRIT: 35.2 % — AB (ref 36.0–46.0)
Hemoglobin: 11.7 g/dL — ABNORMAL LOW (ref 12.0–15.0)
MCH: 30.2 pg (ref 26.0–34.0)
MCHC: 33.2 g/dL (ref 30.0–36.0)
MCV: 91 fL (ref 78.0–100.0)
Platelets: 202 10*3/uL (ref 150–400)
RBC: 3.87 MIL/uL (ref 3.87–5.11)
RDW: 14.4 % (ref 11.5–15.5)
WBC: 7 10*3/uL (ref 4.0–10.5)

## 2016-04-20 LAB — BASIC METABOLIC PANEL
ANION GAP: 8 (ref 5–15)
BUN: 10 mg/dL (ref 6–20)
CHLORIDE: 102 mmol/L (ref 101–111)
CO2: 28 mmol/L (ref 22–32)
Calcium: 8.7 mg/dL — ABNORMAL LOW (ref 8.9–10.3)
Creatinine, Ser: 0.95 mg/dL (ref 0.44–1.00)
GFR calc non Af Amer: 58 mL/min — ABNORMAL LOW (ref >60)
GLUCOSE: 170 mg/dL — AB (ref 65–99)
POTASSIUM: 4.1 mmol/L (ref 3.5–5.1)
Sodium: 138 mmol/L (ref 135–145)

## 2016-04-20 LAB — GLUCOSE, CAPILLARY
GLUCOSE-CAPILLARY: 174 mg/dL — AB (ref 65–99)
Glucose-Capillary: 187 mg/dL — ABNORMAL HIGH (ref 65–99)
Glucose-Capillary: 204 mg/dL — ABNORMAL HIGH (ref 65–99)
Glucose-Capillary: 245 mg/dL — ABNORMAL HIGH (ref 65–99)

## 2016-04-20 LAB — URINE CULTURE: Culture: NO GROWTH

## 2016-04-20 LAB — VANCOMYCIN, TROUGH: VANCOMYCIN TR: 49 ug/mL — AB (ref 15–20)

## 2016-04-20 MED ORDER — DIATRIZOATE MEGLUMINE & SODIUM 66-10 % PO SOLN
15.0000 mL | ORAL | Status: AC
  Administered 2016-04-20 (×2): 15 mL via ORAL
  Filled 2016-04-20: qty 30

## 2016-04-20 MED ORDER — INSULIN ASPART 100 UNIT/ML ~~LOC~~ SOLN
0.0000 [IU] | Freq: Three times a day (TID) | SUBCUTANEOUS | Status: DC
  Administered 2016-04-20 – 2016-04-21 (×2): 4 [IU] via SUBCUTANEOUS
  Administered 2016-04-21: 7 [IU] via SUBCUTANEOUS

## 2016-04-20 MED ORDER — IOPAMIDOL (ISOVUE-300) INJECTION 61%
INTRAVENOUS | Status: AC
  Administered 2016-04-20: 100 mL
  Filled 2016-04-20: qty 100

## 2016-04-20 MED ORDER — INSULIN STARTER KIT- PEN NEEDLES (ENGLISH)
1.0000 | Freq: Once | Status: AC
  Administered 2016-04-20: 1
  Filled 2016-04-20: qty 1

## 2016-04-20 NOTE — Progress Notes (Signed)
S/W TERESA @ [facility] # [phone]   LANTUS INSULIN : NONE FORMULARY   LEVEMIR INSULIN:   COVER- YES  TIER- 3 DRUG  PRIOR APPROVAL- NO  INJ/ 100 ML FOR 30 DAYS $ 79.58  PEN - $119.32 30 DAYS  PHARMACY:  Laray Anger , [facility]

## 2016-04-20 NOTE — Progress Notes (Signed)
Pharmacy Antibiotic Note Marisa Davenport is a 75 y.o. female admitted on 04/14/2016 with cellulitis and streptococcal bacteremia following a cat scratch. Pt continues on Vancomycin (Day #3) and Azithromycin (Day #4) for cellulitis.  6/27 Vancomycin trough of 49 mcg/ml (INACCURATE as dose hung prior to trough being drawn)  Plan: Continue current dose of Vancomycin 1gm IV q12h Will recheck vancomycin trough before 1100 dose on 6/28  Height: 5\' 2"  (157.5 cm) Weight: 209 lb 7 oz (95 kg) IBW/kg (Calculated) : 50.1  Temp (24hrs), Avg:98 F (36.7 C), Min:97.6 F (36.4 C), Max:98.3 F (36.8 C)   Recent Labs Lab 04/14/16 1257  04/15/16 0240 04/15/16 0252 04/15/16 0456 04/15/16 1155 04/16/16 0512 04/17/16 0541 04/18/16 0411 04/19/16 0502 04/20/16 0537 04/20/16 2227  WBC  --   --  9.0  --   --   --  8.3 5.8  --  5.9 7.0  --   CREATININE  --   < > 0.92  --   --   --  0.99 0.74 0.67 0.73 0.95  --   LATICACIDVEN 5.95*  --   --  2.2* 2.8* 2.1*  --   --   --   --   --   --   VANCOTROUGH  --   --   --   --   --   --   --   --   --   --   --  49*  < > = values in this interval not displayed.  Estimated Creatinine Clearance: 57.5 mL/min (by C-G formula based on Cr of 0.95).    Allergies  Allergen Reactions  . Penicillins     REACTION: hives    Antimicrobials this admission: 6/21 Ancef >> 6/23 6/24 Zithromax > 6/29 6/23 Ceftriaxone >>6/25 6/25 vancomycin  >>   Dose adjustments this admission: n/a  Microbiology results: 6/21 MRSA PCR - negative  6/21 UCx - E.coli pan S  6/21 BCx - 1/2 strep G - pan S  6/23: c diff: negative  6/24 BCx: ngtd  6/25 Bcx: ngtd  6/25 urine: neg  Thank you for allowing pharmacy to be a part of this patient's care.  Sherlon Handing, PharmD, BCPS Clinical pharmacist, pager [phone] 04/20/2016, 11:06 PM

## 2016-04-20 NOTE — Progress Notes (Signed)
Subjective: Patient feels well this morning without nausea, vomiting, chest pain, or palpitations. She has continued swelling of her left foot. She is asking to take a shower.   Objective: Vital signs in last 24 hours: Filed Vitals:   04/19/16 2152 04/20/16 0525 04/20/16 0820 04/20/16 1514  BP: 143/63 152/68 142/90 157/73  Pulse: 77 67 78 79  Temp: 98.6 F (37 C) 97.6 F (36.4 C)  98 F (36.7 C)  TempSrc:    Oral  Resp: 19 17  18   Height:      Weight:      SpO2: 94% 96%  100%   Weight change:   Intake/Output Summary (Last 24 hours) at 04/20/16 1544 Last data filed at 04/20/16 0616  Gross per 24 hour  Intake    640 ml  Output    450 ml  Net    190 ml   General: sitting up in bed comfortably, pleasant Cardiac: RRR, no murmurs heard Pulm: clear to auscultation, moving normal volumes of air Abd: soft, nondistended, nontender Ext: LLE cellulitis extended beyond the previously marked borders with some improvement in erythema on the medial aspect, no active drainage noted, mildly warm to touch and tender, +2 pitting edema Neuro: alert and oriented X3, conversing appropriately  Assessment/Plan: Active Problems:   Weakness   Bacteremia due to Streptococcus   Cellulitis of leg, left   T2DM (type 2 diabetes mellitus) (HCC)   Cellulitis and abscess of lower extremity  Cellulitis complicated by Streptococcal Bacteremia: Likely seeded after cat scratch at her LLE cellulitis. 1 of 2 blood cultures growing Streptococcus Group G. MRI on 6/24 without evidence of drainable abscess, myofascitis, or osteomyelitis. Cellulitis with mild improvement compared to yesterday. Current antibiotics are now Vancomycin and Azithromycin. Due to minimal improvement and history of inguinal lymphoma, have obtained CT abdomen/pelvis for further evaluation. -f/u CT abdomen/pelvis -Off Ceftriaxone [6/23 >> 6/25] -Continue Azithromycin for empiric coverage of Bartonella [Day 4/5] -Continue Vancomycin per  pharmacy -Continue Wound Care per [place] recommendations -PT recommending HHPT, appreciate assistance -f/u repeat blood cultures (6/24) to document clearance, will likely need PICC for continued outpatient IV antibiotics -Follow BMET and CBC in AM  Paroxysmal atrial fibrillation: CHADSVASC score 4. TSH is elevated at 10.348 with free T4 slightly increased at 1.16 which is suggestive of euthyroid sick syndrome. Started on Xarelto this admission. Has intermittent episodes of tachycardia with rates up to 170s. Will add Metoprolol for rate control and blood pressure and will need to monitor heart rate as she has had some bradycardia on review of telemetry. -Continue Xarelto, defer to PCP on duration of therapy, likely indefinite -Start Metoprolol 25 mg BID -Consider Cardiology consult if continued tachy-brady episodes -Repeat thyroid testing as outpatient confirm resolution of acute illness   Poorly controlled type 2 diabetes: Hgb A1C 8.6. Patient is giving herself insulin shots while here. Feels comfortable to do this at home on discharge. -Appreciate Diabetic coordinator assistance -SSI-R -Lantus 12 units -Add Metformin on discharge -Will need Levemir on discharge with insulin pens and pen needles   Hypokalemia: Resolved -Recheck BMP in AM -Replete as needed  Dispo: Disposition is deferred at this time, awaiting improvement of current medical problems.  Anticipated discharge in approximately 1-3 day(s).   The patient does not have a current PCP Laurey Morale, MD) and does need an [facility] hospital follow-up appointment after discharge.  The patient does have transportation limitations that hinder transportation to clinic appointments.    LOS: 6 days  Zada Finders, MD 04/20/2016, 3:44 PM

## 2016-04-20 NOTE — Progress Notes (Addendum)
Inpatient Diabetes Program Recommendations  AACE/ADA: New Consensus Statement on Inpatient Glycemic Control (2015)  Target Ranges:  Prepandial:   less than 140 mg/dL      Peak postprandial:   less than 180 mg/dL (1-2 hours)      Critically ill patients:  140 - 180 mg/dL   Results for Marisa Davenport, Marisa Davenport (MRN [id number]) as of 04/20/2016 08:20  Ref. Range 04/19/2016 07:52 04/19/2016 12:09 04/19/2016 16:54 04/19/2016 21:50 04/20/2016 07:59  Glucose-Capillary Latest Ref Range: 65-99 mg/dL 187 (H) 295 (H) 274 (H) 175 (H) 187 (H)   Review of Glycemic Control  Current orders for Inpatient glycemic control: Novolog 0-15 units ACHS, Lantus 12 units QHS  Inpatient Diabetes Program Recommendations: Insulin - Meal Coverage: Post prandial glucose is consistently elevated. While inpatient, please consider ordering Novolog 3 units TID with meals if patient is eating at least 50% of meals.  Addendum 04/20/16@13:26-Spoke with patient about new DM dx and potential to discharge on insulin. Discussed Lantus and Novolog insulin and explained how they are currently ordered. Noted progress note by the Case Manager that patient's Medicare does not cover Lantus but dose cover Levemir insulin. Informed patient of cost of both Levemir pens and Levemir vials per Case Manager note and patient states she can afford the cost of Levemir. Discussed and reviewed how to use vial/syringe versus insulin pen for insulin administration.  Educated patient on insulin pen and insulin injection with vial/syringe and patient states that she prefers to use insulin pens.  Reviewed contents of insulin flexpen starter kit. Reviewed all steps of insulin pen including attachment of needle, 2-unit air shot, dialing up dose, giving injection, removing needle, disposal of sharps, storage of unused insulin, disposal of insulin etc. Patient able to provide successful return demonstration. Patient verbalized understanding of information discussed and she states that  she has no further questions at this time.   At time of discharge if patient is ordered insulin: MD to give patient Rxs for insulin pens and insulin pen needles.   Thanks, Marie Byrd, RN, MSN, CDE Diabetes Coordinator Inpatient Diabetes Program [phone] (Team Pager from 8am to 5pm) [phone] ([facility] office) [phone] ([facility] office) [phone] ([facility] office)   

## 2016-04-20 NOTE — Progress Notes (Signed)
Patient seen and examined. Case d/w residents in detail. I agree with findings and plan as documented in Dr. Serita Grit note.   Patient feels well but is noted to have persistent erythema and swelling consistent with cellulitis on left leg and foot. Repeat blood cx are negative till date. Given history of lymphoma and persistent swelling in left lower extremity will obtain CT abd/pelvis to look for any possible etiology for obstruction to L LE. Will c/w vancomycin and azithromycin (to cover for bartonella) for now as her cellulitis worsened on ceftriaxone.

## 2016-04-21 LAB — GLUCOSE, CAPILLARY
GLUCOSE-CAPILLARY: 203 mg/dL — AB (ref 65–99)
Glucose-Capillary: 189 mg/dL — ABNORMAL HIGH (ref 65–99)

## 2016-04-21 LAB — BASIC METABOLIC PANEL
Anion gap: 11 (ref 5–15)
BUN: 11 mg/dL (ref 6–20)
CALCIUM: 8.4 mg/dL — AB (ref 8.9–10.3)
CO2: 22 mmol/L (ref 22–32)
CREATININE: 0.89 mg/dL (ref 0.44–1.00)
Chloride: 104 mmol/L (ref 101–111)
GFR calc Af Amer: >60 mL/min (ref >60)
GLUCOSE: 187 mg/dL — AB (ref 65–99)
Potassium: 3.8 mmol/L (ref 3.5–5.1)
Sodium: 137 mmol/L (ref 135–145)

## 2016-04-21 LAB — CBC
HEMATOCRIT: 35.4 % — AB (ref 36.0–46.0)
Hemoglobin: 11.9 g/dL — ABNORMAL LOW (ref 12.0–15.0)
MCH: 29.5 pg (ref 26.0–34.0)
MCHC: 33.6 g/dL (ref 30.0–36.0)
MCV: 87.8 fL (ref 78.0–100.0)
PLATELETS: 229 10*3/uL (ref 150–400)
RBC: 4.03 MIL/uL (ref 3.87–5.11)
RDW: 14 % (ref 11.5–15.5)
WBC: 5 10*3/uL (ref 4.0–10.5)

## 2016-04-21 LAB — VANCOMYCIN, TROUGH: Vancomycin Tr: 12 ug/mL — ABNORMAL LOW (ref 15–20)

## 2016-04-21 MED ORDER — METFORMIN HCL 500 MG PO TABS: 500.0000 mg | ORAL_TABLET | Freq: Every day | ORAL | Status: DC

## 2016-04-21 MED ORDER — METOPROLOL TARTRATE 25 MG PO TABS: 25.0000 mg | ORAL_TABLET | Freq: Two times a day (BID) | ORAL | Status: DC

## 2016-04-21 MED ORDER — BLOOD GLUCOSE METER KIT: PACK | Status: DC

## 2016-04-21 MED ORDER — INSULIN DETEMIR 100 UNIT/ML FLEXPEN: 12.0000 [IU] | PEN_INJECTOR | Freq: Every day | SUBCUTANEOUS | Status: DC

## 2016-04-21 MED ORDER — CEPHALEXIN 500 MG PO CAPS: 500.0000 mg | ORAL_CAPSULE | Freq: Four times a day (QID) | ORAL | Status: DC

## 2016-04-21 MED ORDER — XEROFORM PETROLATUM DRESSING EX PADS: 1.0000 | MEDICATED_PAD | Freq: Every day | CUTANEOUS | Status: DC

## 2016-04-21 MED ORDER — "PEN NEEDLES 3/16"" 31G X 5 MM MISC": Status: DC

## 2016-04-21 MED ORDER — DOXYCYCLINE HYCLATE 100 MG PO TABS: 100.0000 mg | ORAL_TABLET | Freq: Two times a day (BID) | ORAL | Status: DC

## 2016-04-21 MED ORDER — RIVAROXABAN 20 MG PO TABS: 20.0000 mg | ORAL_TABLET | Freq: Every day | ORAL | Status: DC

## 2016-04-21 NOTE — Progress Notes (Signed)
Vancomycin trough drawn prior to discontinuation of medication by IMTS.  Trough was in therapeutic range for cellulitis at 51mcg/mL.  Noted patient to transition to PO antibiotics. Vancomycin per pharmacy consult discontinued.    Kaneshia Cater D. Chaniqua Brisby, PharmD, BCPS Clinical Pharmacist 04/21/2016 11:27 AM

## 2016-04-21 NOTE — Progress Notes (Signed)
Patient seen and examined. Case d/w residents in detail. I agree with findings and plan as documented in Dr. Serita Grit note.  Patient feels well. Still with left lower extremity erythema- mildly improved today. No fevers, no leukocytosis, BP is stable. Will transition to PO abx and d/c home today. She will need one time follow up in [facility] before traveling back to [place].   No recurrent afib episodes - will d/c on xarelto and metoprolol. Needs outpatient f/u.

## 2016-04-21 NOTE — Care Management Important Message (Signed)
Important Message  Patient Details  Name: Cetera Ticer MRN: [id number] Date of Birth: 23-Jan-1941   Medicare Important Message Given:  Yes    Loann Quill 04/21/2016, 10:13 AM

## 2016-04-21 NOTE — Progress Notes (Addendum)
Subjective: Patient feels well this morning. She says she kept her left leg elevated overnight. She denies any chest pain, palpitations, nausea, vomiting. She is afebrile overnight. She has been able to walk in her room without issue. Discussed plan to transition to oral antibiotics with patient and additional medications she will now need to take at home. She is stable to go home today and will stay with her sister through next week. She will follow up in [facility] while she is still here and establish with a PCP in [place] to manage her chronic and acute medical conditions.  Objective: Vital signs in last 24 hours: Filed Vitals:   04/20/16 1514 04/20/16 2242 04/21/16 0238 04/21/16 0726  BP: 157/73 181/63 98/67 148/69  Pulse: 79 73 79 73  Temp: 98 F (36.7 C) 98.3 F (36.8 C)  98.3 F (36.8 C)  TempSrc: Oral     Resp: 18 17  17   Height:      Weight:      SpO2: 100% 98%  98%   Weight change:   Intake/Output Summary (Last 24 hours) at 04/21/16 1111 Last data filed at 04/21/16 0600  Gross per 24 hour  Intake   1020 ml  Output      1 ml  Net   1019 ml   General: sitting up in bed comfortably, pleasant Cardiac: RRR, no murmurs heard Pulm: clear to auscultation, moving normal volumes of air Abd: soft, nondistended, nontender Ext: LLE cellulitis extended beyond the previously marked borders with some improvement in erythema on the medial aspect, no active drainage noted, +2 pitting edema with tenderness on palpation   Assessment/Plan: Active Problems:   Weakness   Bacteremia due to Streptococcus   Cellulitis of leg, left   T2DM (type 2 diabetes mellitus) (HCC)   Cellulitis and abscess of lower extremity  Cellulitis complicated by Streptococcal Bacteremia: Likely seeded after cat scratch at her LLE cellulitis. 1 of 2 blood cultures growing Streptococcus Group G. MRI on 6/24 without evidence of drainable abscess, myofascitis, or osteomyelitis. Current antibiotics are now  Vancomycin and Azithromycin. Due to minimal improvement in appearance and history of inguinal lymphoma, have obtained CT abdomen/pelvis for further evaluation which did not show sign of obstruction or lymphadenopathy. Discussed with Infectious Disease who recommended transition to oral antibiotics for 7-10 day course as patient is afebrile, without leukocytosis, and overall improved and stable with negative workup as described above. Will transition to oral Keflex and Doxycycline for strep and MRSA coverage. -Off Ceftriaxone [6/23 >> 6/25] -Complete Azithromycin today for empiric coverage of Bartonella [Day 5/5] -d/c Vancomycin per pharmacy (6/25) Day #4 -Start Keflex 500 mg q6h for 10 days -Start Doxycycline 100 mg BID for 10 days -Continue Wound Care per [facility] recommendations -Elevate leg when in bed -Ambulate with assistance  Paroxysmal atrial fibrillation: CHADSVASC score 4. TSH is elevated at 10.348 with free T4 slightly increased at 1.16 which is suggestive of euthyroid sick syndrome. Started on Xarelto this admission. Metoprolol added for rate control and blood pressure. -Continue Xarelto, defer to PCP on duration of therapy, likely indefinite -Continue Metoprolol 25 mg BID -Repeat thyroid testing as outpatient to confirm resolution of acute illness   Poorly controlled type 2 diabetes: Hgb A1C 8.6. Patient is giving herself insulin shots while here. Feels comfortable to do this at home on discharge. -Appreciate Diabetic coordinator assistance -SSI-R -Lantus 12 units -Add Metformin on discharge -Will need Levemir on discharge with prescription for insulin pens and pen needles, monitor  and strips  Blood Pressure: Had high BP of 181/63 last night with low of 98/67. 148/69 this morning. -Continue Metoprolol as above -Consider adding ACE-I if persistently elevated blood pressures  Hypokalemia: Resolved -Monitor BMP -Replete as needed  Dispo: Anticipated discharge today. Patient will  stay with sister until end of next week before going home to [place]. She will follow up once in [facility] prior leaving [place].  The patient does not have a current PCP Laurey Morale, MD) and does need an [facility] hospital follow-up appointment after discharge.  The patient does have transportation limitations that hinder transportation to clinic appointments.    LOS: 7 days   Zada Finders, MD 04/21/2016, 11:11 AM

## 2016-04-21 NOTE — Progress Notes (Signed)
Claudette Stapler to be D/C'd to home per MD order.  Discussed with the patient and all questions fully answered.  VSS, Skin clean, dry and intact without evidence of skin break down, no evidence of skin tears noted. IV catheter discontinued intact. Site without signs and symptoms of complications. Dressing and pressure applied.  An After Visit Summary was printed and given to the patient. Patient received prescriptions. Patient instruction on how to do dressing changes and administer insulin injections. Patient able to demonstrate teach back and competent to do self care.  D/c education completed with patient/family including follow up instructions, medication list, d/c activities limitations if indicated, with other d/c instructions as indicated by MD - patient able to verbalize understanding, all questions fully answered.   Patient instructed to return to ED, call 911, or call MD for any changes in condition.   Patient escorted via [place], and D/C home via private auto.  Morley Kos Price 04/21/2016 1:17 PM

## 2016-04-22 LAB — CULTURE, BLOOD (ROUTINE X 2)
CULTURE: NO GROWTH
Culture: NO GROWTH

## 2016-04-23 LAB — CULTURE, BLOOD (ROUTINE X 2)
CULTURE: NO GROWTH
Culture: NO GROWTH

## 2016-04-26 ENCOUNTER — Ambulatory Visit (INDEPENDENT_AMBULATORY_CARE_PROVIDER_SITE_OTHER): Payer: 59 | Admitting: Internal Medicine

## 2016-04-26 VITALS — BP 134/62 | HR 72 | Temp 97.6°F | Ht 63.0 in | Wt 206.5 lb

## 2016-04-26 DIAGNOSIS — B9689 Other specified bacterial agents as the cause of diseases classified elsewhere: Secondary | ICD-10-CM

## 2016-04-26 DIAGNOSIS — L03116 Cellulitis of left lower limb: Secondary | ICD-10-CM | POA: Diagnosis not present

## 2016-04-26 MED ORDER — DOXYCYCLINE HYCLATE 100 MG PO TABS: 100.0000 mg | ORAL_TABLET | Freq: Two times a day (BID) | ORAL | Status: DC

## 2016-04-26 NOTE — Assessment & Plan Note (Addendum)
The patient has left leg cellulitis which is active but stable since her hospital discharge. Currently, she still has a very serious infection and we will follow her closely in the clinic this week to determine if she needs hospital admission for IV antibiotics.  -- Will start doxycyline 100mg  bid for 10 days -- Will return to clinic on 04/29/2016 for follow-up appointment to assess benefits of therapy -- Discussed that she should return if anything worsens or if she develops fevers, chills or n/v

## 2016-04-26 NOTE — Patient Instructions (Signed)
  It was great meeting you this afternoon. Thank you for choosing [facility] for your healthcare needs.  Today we discussed a hospital follow-up plan for you LLE cellulitis. -- Start Doxycycline 100mg  twice daily for 10 days -- Return to clinic on Thursday morning for check of left leg cellulitis -- Return earlier if needed or if leg worsens    Cellulitis Cellulitis is an infection of the skin and the tissue beneath it. The infected area is usually red and tender. Cellulitis occurs most often in the arms and lower legs.  CAUSES  Cellulitis is caused by bacteria that enter the skin through cracks or cuts in the skin. The most common types of bacteria that cause cellulitis are staphylococci and streptococci. SIGNS AND SYMPTOMS   Redness and warmth.  Swelling.  Tenderness or pain.  Fever. DIAGNOSIS  Your health care provider can usually determine what is wrong based on a physical exam. Blood tests may also be done. TREATMENT  Treatment usually involves taking an antibiotic medicine. HOME CARE INSTRUCTIONS   Take your antibiotic medicine as directed by your health care provider. Finish the antibiotic even if you start to feel better.  Keep the infected arm or leg elevated to reduce swelling.  Apply a warm cloth to the affected area up to 4 times per day to relieve pain.  Take medicines only as directed by your health care provider.  Keep all follow-up visits as directed by your health care provider. SEEK MEDICAL CARE IF:   You notice red streaks coming from the infected area.  Your red area gets larger or turns dark in color.  Your bone or joint underneath the infected area becomes painful after the skin has healed.  Your infection returns in the same area or another area.  You notice a swollen bump in the infected area.  You develop new symptoms.  You have a fever. SEEK IMMEDIATE MEDICAL CARE IF:   You feel very sleepy.  You develop vomiting or diarrhea.  You  have a general ill feeling (malaise) with muscle aches and pains.   This information is not intended to replace advice given to you by your health care provider. Make sure you discuss any questions you have with your health care provider.   Document Released: 07/21/2005 Document Revised: 07/02/2015 Document Reviewed: 12/27/2011 Elsevier Interactive Patient Education [employer].

## 2016-04-26 NOTE — Progress Notes (Signed)
   CC: Hospital follow-up HPI: Ms.Marisa Davenport is a 75 y.o. female with a complex past medical history including DM, non-hodgkin's lymphoma, allergic rhinitis and cellulitis who presents as a hospital follow-up. For full details of her most recent admission please see the admission discharge. Briefly she was admitted for a left lower extremity cellulitis requiring IV antibiotic treatment. While inpatient she developed a bacteremia and required therapy for 7 days. She was d/c on oral cephalexin and doxycyline but only started taking the cephalexin. Today she says that her cellulitis has improved slightly since admission. She endorses some shooting pains in her lower left extremity that have worsened over the last several days. She denies fever, chills, sweats, and n/v. She thinks it is improving and wishes to continue outpatient treatment at this time with close follow-up  On ROS: She denied chest pain, SOB or changes with her bowel or bladder habbits    Past Medical History  Diagnosis Date  . Allergy   . Diabetes mellitus, type II ([place])   . Non Hodgkin's lymphoma ([place]) 1992     resolved   . Elevated BP   . Bursitis left hip  . Cat scratch of left lower leg 03/2016  . Cellulitis 03/2016    LEFT LOWER LEG  . Hyperlipidemia   . Bilateral leg edema 03/2016   Review of Systems: Complete review of systems was negative except for as noted in the HPI Physical Exam: Filed Vitals:   04/26/16 1529  BP: 134/62  Pulse: 72  Temp: 97.6 F (36.4 C)  TempSrc: Oral  Height: 5\' 3"  (1.6 m)  Weight: 206 lb 8 oz (93.668 kg)  SpO2: 100%  Constitutional: In no acute distress, appears obese HEENT: no conjunctivitis or scleral icterus. Head atraumatic and normocephalic  Pulmonary: normal work of breathing MSK: Swollen erythematous left lower extremity with erythema extending to the knee joint. Additionally, there is a 2cm bulla on the posterior aspect of her left lower extremity. Her extremity was tense and  warm to the touch. The erythema was noted around the entire circumference of her left lower extremity and extended onto her foot. Her foot was also swollen. She is still able to walk and is gaining more mobility in her left lower extremity  Assessment & Plan:  See encounters tab for problem based medical decision making. Patient seen with Dr. Evette Doffing

## 2016-04-29 ENCOUNTER — Ambulatory Visit (INDEPENDENT_AMBULATORY_CARE_PROVIDER_SITE_OTHER): Payer: 59 | Admitting: Internal Medicine

## 2016-04-29 ENCOUNTER — Encounter: Payer: Self-pay | Admitting: Internal Medicine

## 2016-04-29 VITALS — BP 110/65 | HR 71 | Temp 97.7°F | Wt 203.2 lb

## 2016-04-29 DIAGNOSIS — L03116 Cellulitis of left lower limb: Secondary | ICD-10-CM

## 2016-04-29 DIAGNOSIS — B9689 Other specified bacterial agents as the cause of diseases classified elsewhere: Secondary | ICD-10-CM

## 2016-04-29 NOTE — Assessment & Plan Note (Addendum)
Today we say Marisa Davenport as a follow-up to re-evaluate her left leg cellulitis. Currently, her condition is active with very mild improvement. She continues to take both antibiotics as prescribed. She has no signs of systemic infection and denies fevers, chills, night sweats, n/v and diarrhea. At this point we do not think she needs IV antibiotics and will continue to improve on her current regimen. She will have close follow-up with her PCP in [place] when she returns home on Saturday -- Continue antibiotics as prescribed (doxycyline and cephalexin)  -- F/U with PCP in [place]

## 2016-04-29 NOTE — Progress Notes (Signed)
Internal Medicine Clinic Attending  I saw and evaluated the patient.  I personally confirmed the key portions of the history and exam documented by Dr. Lovena Le and I reviewed pertinent patient test results.  The assessment, diagnosis, and plan were formulated together and I agree with the documentation in the resident's note.  Diabetic here for follow up of left leg cellulitis which is being very slow to heal. There was a mix up on discharge from the hospital, she has only been taking keflex the last few days and did not fill the doxycycline. We corrected this and represcribed doxy. The leg is still very inflammed, but no fluid collection, no pain, good distal perfusion at this point. We asked her to return to clinic in three days so we can track her progress.

## 2016-04-29 NOTE — Patient Instructions (Signed)
It was a pleasure seeing you today. Thank you for choosing [facility] for your healthcare needs.  --Continue both antibiotics as prescribed  -- Follow-up with PCP in [place] as soon as you return   Cellulitis Cellulitis is an infection of the skin and the tissue beneath it. The infected area is usually red and tender. Cellulitis occurs most often in the arms and lower legs.  CAUSES  Cellulitis is caused by bacteria that enter the skin through cracks or cuts in the skin. The most common types of bacteria that cause cellulitis are staphylococci and streptococci. SIGNS AND SYMPTOMS   Redness and warmth.  Swelling.  Tenderness or pain.  Fever. DIAGNOSIS  Your health care provider can usually determine what is wrong based on a physical exam. Blood tests may also be done. TREATMENT  Treatment usually involves taking an antibiotic medicine. HOME CARE INSTRUCTIONS   Take your antibiotic medicine as directed by your health care provider. Finish the antibiotic even if you start to feel better.  Keep the infected arm or leg elevated to reduce swelling.  Apply a warm cloth to the affected area up to 4 times per day to relieve pain.  Take medicines only as directed by your health care provider.  Keep all follow-up visits as directed by your health care provider. SEEK MEDICAL CARE IF:   You notice red streaks coming from the infected area.  Your red area gets larger or turns dark in color.  Your bone or joint underneath the infected area becomes painful after the skin has healed.  Your infection returns in the same area or another area.  You notice a swollen bump in the infected area.  You develop new symptoms.  You have a fever. SEEK IMMEDIATE MEDICAL CARE IF:   You feel very sleepy.  You develop vomiting or diarrhea.  You have a general ill feeling (malaise) with muscle aches and pains.   This information is not intended to replace advice given to you by your health  care provider. Make sure you discuss any questions you have with your health care provider.   Document Released: 07/21/2005 Document Revised: 07/02/2015 Document Reviewed: 12/27/2011 Elsevier Interactive Patient Education [employer].

## 2016-04-29 NOTE — Progress Notes (Signed)
CC: Cellulitis Follow-up HPI: [name] is a 75 y.o. female with a complex past medical history including DM, non-hodgkin's lymphoma, allergic rhinitis and cellulitis who presents as a scheduled follow-up from clinic to assess her left leg cellulitis. She was recently seen in the clinic on 04/26/2016 for a hospital follow-up visit to evaluate her cellulitis. At that time her cellulitis was still concerning and we scheduled a return visit for her in the clinic today (04/29/2016).  At her first follow-up visit there was some confusion over the antibiotics she was taking however today she informed me that she has been taking the Doxycyline as originally prescribed from the hospital. At the visit today her leg looks very similar with only slight improvement in redness and swelling of her foot and decreased redness on the medial aspect of her leg. She denies any new pain, numbness, paraesthesias or weakness in her left leg. She denies headaches, fevers, chills, n/v or diarrhea. She also denies any chest pain, shortness of breath, or abdominal pain.   She plans to travel back to [place] on Saturday and will see her PCP early next week.    Past Medical History  Diagnosis Date  . Allergy   . Diabetes mellitus, type II ([place])   . Non Hodgkin's lymphoma ([place]) 1992     resolved   . Elevated BP   . Bursitis left hip  . Cat scratch of left lower leg 03/2016  . Cellulitis 03/2016    LEFT LOWER LEG  . Hyperlipidemia   . Bilateral leg edema 03/2016   Current Outpatient Rx  Name  Route  Sig  Dispense  Refill  . Bismuth Tribromoph-Petrolatum (XEROFORM PETROLATUM DRESSING) PADS   Apply externally   Apply 1 each topically daily.   25 each   0   . blood glucose meter kit and supplies      Dispense based on patient and insurance preference. Use up to four times daily as directed. (FOR ICD-9 250.00, 250.01).   1 each   0   . cephALEXin (KEFLEX) 500 MG capsule   Oral   Take 1 capsule (500 mg  total) by mouth 4 (four) times daily.   40 capsule   0   . doxycycline (VIBRA-TABS) 100 MG tablet   Oral   Take 1 tablet (100 mg total) by mouth 2 (two) times daily.   20 tablet   0   . Insulin Detemir (LEVEMIR) 100 UNIT/ML Pen   Subcutaneous   Inject 12 Units into the skin daily at 10 pm.   15 mL   11   . Insulin Pen Needle (PEN NEEDLES 3/16") 31G X 5 MM MISC      Please check your blood sugar three times a day prior to meals.   90 each   1   . metFORMIN (GLUCOPHAGE) 500 MG tablet   Oral   Take 1 tablet (500 mg total) by mouth daily with breakfast.   60 tablet   1   . metoprolol tartrate (LOPRESSOR) 25 MG tablet   Oral   Take 1 tablet (25 mg total) by mouth 2 (two) times daily.   60 tablet   1   . rivaroxaban (XARELTO) 20 MG TABS tablet   Oral   Take 1 tablet (20 mg total) by mouth daily with supper.   30 tablet   1       Review of Systems: A complete ROS was negative except as per HPI.  Physical  Exam: There were no vitals filed for this visit. General appearance: alert, cooperative and appears older than stated age Head: Normocephalic, without obvious abnormality, atraumatic Lungs: clear to auscultation bilaterally Heart: regular rate and rhythm, S1, S2 normal, no murmur, click, rub or gallop Abdomen: soft, non-tender; bowel sounds normal; no masses,  no organomegaly Extremities: Left lower extremity cellulitis from the ankle to the knee, very erythematous and warm to the touch, no pain with palpation, small bulla on the posterior surface of her leg,weak dorsalis pedies pulse palpable   Assessment & Plan:  See encounters tab for problem based medical decision making. Patient seen with Dr. Evette Doffing  Signed: Ophelia Shoulder, MD 04/29/2016, 10:19 AM  Pager: [phone]

## 2016-04-30 NOTE — Progress Notes (Signed)
Internal Medicine Clinic Attending  I saw and evaluated the patient.  I personally confirmed the key portions of the history and exam documented by Dr. Lovena Le and I reviewed pertinent patient test results.  The assessment, diagnosis, and plan were formulated together and I agree with the documentation in the resident's note.  Left leg cellulitis looks to be improving slowly. No further systemic symptoms of infection, she is doing well at home and planning to return to [place] in the next few days. She has all her antibiotics as prescribed, and will follow up closely with her home PCP next week.

## 2016-05-03 ENCOUNTER — Telehealth: Payer: Self-pay | Admitting: Internal Medicine

## 2016-05-03 NOTE — Telephone Encounter (Signed)
Patient has one dose of doxycycline and keflex left for her cellulitis. Her leg is "red and peeling, but looks a little better." Patient is out of town and going to [place] tomorrow. She wanted to know if she needed more ABX before her follow up appointment. Please advise.

## 2016-05-03 NOTE — Telephone Encounter (Signed)
No answer and no voice mail.

## 2016-05-03 NOTE — Telephone Encounter (Signed)
I tried to call the patient at Mar[facility]port but there was no answer and no option to leave voicemail.

## 2016-05-03 NOTE — Telephone Encounter (Signed)
Pt has questions about her 2 meds doxycycline and keflex

## 2016-05-03 NOTE — Telephone Encounter (Signed)
Based on review of previous notes, patient was to follow up with PCP in [place] when she gets back there.  I would not advise further Abx without physically seeing her in clinic.  She should travel back to [place] and get in to see her PCP asap for further medical advice/therapy as needed.  Thanks

## 2016-05-04 NOTE — Telephone Encounter (Signed)
Spoke with patient who was on her way to [place]. She agreed to follow up as soon as she got there.

## 2016-06-02 DIAGNOSIS — M7062 Trochanteric bursitis, left hip: Secondary | ICD-10-CM | POA: Diagnosis not present

## 2016-06-09 DIAGNOSIS — M7062 Trochanteric bursitis, left hip: Secondary | ICD-10-CM | POA: Diagnosis not present

## 2016-06-09 DIAGNOSIS — M6281 Muscle weakness (generalized): Secondary | ICD-10-CM | POA: Diagnosis not present

## 2016-06-09 DIAGNOSIS — M25552 Pain in left hip: Secondary | ICD-10-CM | POA: Diagnosis not present

## 2016-06-09 DIAGNOSIS — R2689 Other abnormalities of gait and mobility: Secondary | ICD-10-CM | POA: Diagnosis not present

## 2016-06-11 DIAGNOSIS — M25552 Pain in left hip: Secondary | ICD-10-CM | POA: Diagnosis not present

## 2016-06-11 DIAGNOSIS — R2689 Other abnormalities of gait and mobility: Secondary | ICD-10-CM | POA: Diagnosis not present

## 2016-06-11 DIAGNOSIS — M7062 Trochanteric bursitis, left hip: Secondary | ICD-10-CM | POA: Diagnosis not present

## 2016-06-11 DIAGNOSIS — M6281 Muscle weakness (generalized): Secondary | ICD-10-CM | POA: Diagnosis not present

## 2016-06-15 DIAGNOSIS — R2689 Other abnormalities of gait and mobility: Secondary | ICD-10-CM | POA: Diagnosis not present

## 2016-06-15 DIAGNOSIS — M6281 Muscle weakness (generalized): Secondary | ICD-10-CM | POA: Diagnosis not present

## 2016-06-15 DIAGNOSIS — M7062 Trochanteric bursitis, left hip: Secondary | ICD-10-CM | POA: Diagnosis not present

## 2016-06-15 DIAGNOSIS — M25552 Pain in left hip: Secondary | ICD-10-CM | POA: Diagnosis not present

## 2016-07-13 DIAGNOSIS — M7062 Trochanteric bursitis, left hip: Secondary | ICD-10-CM | POA: Diagnosis not present

## 2016-07-13 DIAGNOSIS — M6281 Muscle weakness (generalized): Secondary | ICD-10-CM | POA: Diagnosis not present

## 2016-07-13 DIAGNOSIS — M25552 Pain in left hip: Secondary | ICD-10-CM | POA: Diagnosis not present

## 2016-07-13 DIAGNOSIS — R2689 Other abnormalities of gait and mobility: Secondary | ICD-10-CM | POA: Diagnosis not present

## 2016-09-21 DIAGNOSIS — E119 Type 2 diabetes mellitus without complications: Secondary | ICD-10-CM | POA: Diagnosis not present

## 2016-09-29 DIAGNOSIS — Z8572 Personal history of non-Hodgkin lymphomas: Secondary | ICD-10-CM | POA: Diagnosis not present

## 2016-09-29 DIAGNOSIS — Z803 Family history of malignant neoplasm of breast: Secondary | ICD-10-CM | POA: Diagnosis not present

## 2016-09-29 DIAGNOSIS — Z1231 Encounter for screening mammogram for malignant neoplasm of breast: Secondary | ICD-10-CM | POA: Diagnosis not present

## 2017-08-31 ENCOUNTER — Encounter: Payer: Self-pay | Admitting: Gastroenterology

## 2017-09-13 ENCOUNTER — Ambulatory Visit: Payer: Medicare Other | Attending: Family Medicine | Admitting: Neurosurgery

## 2017-09-13 ENCOUNTER — Encounter: Payer: Self-pay | Admitting: Neurosurgery

## 2017-09-13 VITALS — BP 132/80 | HR 107 | Ht 66.0 in | Wt 207.0 lb

## 2017-09-13 DIAGNOSIS — R519 Headache, unspecified: Secondary | ICD-10-CM

## 2017-09-13 DIAGNOSIS — M899 Disorder of bone, unspecified: Secondary | ICD-10-CM

## 2017-09-13 DIAGNOSIS — R51 Headache: Secondary | ICD-10-CM

## 2017-09-13 NOTE — Progress Notes (Signed)
Dear Dr. Rodena Piety,    I had the pleasure of seeing Stacie Kim today.  I appreciate you sending her to see me.    History:     Stacie Kim is a 76 y.o. female with the chief complaint of lightheadedness.  Her history dates back to February of 2018. She states that at this time she fell. She states that she was out walking a dog when she fell on some ice and struck her head. She states that she did not go to the ER following this injury. She states that following this fall she had noted some daily headaches and lightheadedness. She states that she was taking tylenol daily. She states that in August she was hospitalized d/t hypokalemia, she states that following this incident the lightheadedness worsened. She states that at the time of the hospitalization she was worked up for cardiac issues. She states that she did not have any imaging of the head at the time of the hospitalization in August. Most recently, in October she fell and struck her head. She was again taken to the ER and at that time she had a CT scan of the head. She states that there was "an abnormality" noted in the R frontal region. She states that since this time the headaches have worsened, she has had nausea, balance difficulties and the lightheadedness has worsened. She has not had any more falls since the last fall in October. [name] is on Xarelto for A fib, she continues to take this medication.     At this time [name]'s daughters are both with her at this time. Her daughters states that her forgetfulness has worsened. They states that she has some difficulty remembering what she is trying to say in conversation, no problems with word finding. [name] states that she has noted this as well. [name] states that she has noted tinnitus in the R ear, she denies any abnormal smells/odors. She denies any change in her taste. The headaches are daily. She states that tylenol and rest does improve the symptoms.  The headaches are located in the frontal region. [name] lives alone in a one story apartment, she lives on the 4th floor (there is an Media planner). Her daughters live close and provide a great deal of support. No hx of seizures, no weakness in the hands, no complaints of arm pain, neck pain, she does have trouble with balance. She does note that she has been sweating frequently. No recent unintentional weight loss. She does note some vertigo. She states that she has some blurred vision, no diplopia. (she has an appt with an eye doctor in the next few weeks). No hx of brain tumor, aneurysm, or bleeds.     Current Pain Control and Previous Treatment Modalities:                   Pain    09/13/17 0955   PainSc:   3   PainLoc: Head       Tylenol for headaches    Previous Spine Surgery:    none    Depression and/or Anxiety:    no    Tobacco History:    no    Alcohol and Drug History:    no    Occupation and Outside Interests:    Retired worked at [employer] and Social History:    Lives alone in an apartment, has 5 children, 3 daughters live locally  Past Medical History:   Diagnosis Date    Breast cancer     Hypertension 01/01/2011    Palpitations (Symptom) 01/01/2011    Nuc stress                Past Surgical History:   Procedure Laterality Date    CHOLECYSTECTOMY  1967    HYSTERECTOMY  2006    Partial hystterectomy for uterine prolapse    SMALL BOWEL CAPSULE ENDO  07/10/2014         TONSILLECTOMY                  Allergies   Allergen Reactions    Codeine        Headache;     No Known Latex Allergy                 Current Outpatient Prescriptions   Medication    amLODIPine (NORVASC) 5 MG tablet    rivaroxaban (XARELTO) 15 MG tablet    potassium chloride SA (KLOR-CON M20) 20 mEq  tablet    furosemide (LASIX) 20 MG tablet    meclizine (ANTIVERT) 25 MG tablet    metoprolol (LOPRESSOR) 25 MG tablet    pantoprazole (PROTONIX) 20 MG EC tablet    polyethylene glycol (GOLYTELY) 236 GM  suspension    lisinopril (PRINIVIL,ZESTRIL) 5 MG tablet    anastrozole (ARIMIDEX) 1 MG tablet    calcium carbonate-vitamin D 600-400 MG-UNIT per tablet    Multiple Vitamins-Minerals (MULTIVITAMIN PO)     No current facility-administered medications for this visit.          Review of Symptoms:    Twelve systems have been reviewed and pertinent positives are noted in her history above. Other positives are noted on her intake sheets which were filled out prior to her appointment and reviewed.    Physical Exam:       BP 132/80    Pulse 107    Ht 1.676 m (5\' 6" )    Wt 93.9 kg (207 lb)    BMI 33.41 kg/m            General:     Well-developed and in no acute distress.  Eyes: EOMI, sclera clear.  Cardiovascular: No cyanosis, good capillary refill. No edema.  Pulmonary: Clear, even, and easy respirations.  Skin: Warm, dry, and pink.     Musculoskeletal:     Range of motion in the neck is full and in the lower back is full.  There are no tender spots.  SLR is not present. Spurling's sign is not present.     There are no Waddell's signs which can sometimes be positive with somatization.      Focused Neurological Exam:      Alert and oriented to person, place and time. Speech is clear and fluent. Cranial nerves II-XII intact.      Strength bulk and tone were normal in both upper and lower extremities except for what is indicated below in the strength chart.  No pronator drift.  There is normal tone, no atrophy and no fasciculations.     Deltoid Biceps Triceps WE WF Int HF KF KE DF EHL PF   Right 5 5 5 5 5 5 5 5 5 5 5 5    Left 5 5 5 5 5 5 5 5 5 5 5 5      Sensation intact to pinprick, light touch, temperature and proprioception .    Reflexes with DTR's 2+ biceps,  2+ triceps, 2+ brachioradialis, 2+ patellar, 2+ Achilles.  No clonus. Toes are downgoing.      Cerebellar with negative romberg and no dysmetria.    Gait is narrow-based and steady.  Tandem gait is steady.    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 female with a bony lesion noted in the right frontal lobe. Dr Laureen Ochs did examine Stacie Kim and discuss with her and her daughters different options for treating this lesion. He discussed watching the lesion and a more conservative approach vs surgical resection of the lesion. At this time Stacie Kim is interested in having the lesion resected. She has verbalized concern as she has a hx of breast cancer and that other cancers run in her family and she would like to make sure this is nothing that needs further intervention. Dr Laureen Ochs discussed the procedure in detail with her. He discussed risks and benefits of surgery with her and her daughter. We did schedule surgery at this visit, we will begin the insurance authorization process and will also begin arranging PATs and medical clearance. She was advised to call the office with questions or concerns prior to her pre-op appointment in the clinic with me.     The visit was 30 minutes, with greater than 50% of the time spent counseling and coordinating care.    Thank you for this opportunity to care for your patient.  If you have any questions about this patient or others and you wish to speak with me directly, please e-mail, use qliq or call or text my cell phone at any time:  [phone].    Sincerely,      Venetia Maxon, DNP, FNP-C    [email or web address]    cc. Elisha Ponder, [place]             Marice Potter, MD    [facility]  Department of Neurosurgery  Director of [facility]  [place]  [address]  [place], [place] [address]  [phone]         Addendum:    I saw and examined this patient with Ivor Costa, DNP, and I agree with her assessment and plan.  It is likely that this bony lesion is causing discomfort although  I cannot rule out more typical headaches as the cause.  I do not think it is directly causing visual disturbance based on its location.  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  I think it would be very reasonable to remove it and I described how this would be done.  When removed, we will be able to make a definitive diagnosis.  I think it is unlikely to be cancerous.  I explained to her the risks involved which are relatively minimal.  She is on Xeralto which will need to be discontinued five days prior to surgery if possible.    Marice Potter    [email or web address]

## 2017-09-14 DIAGNOSIS — M899 Disorder of bone, unspecified: Secondary | ICD-10-CM | POA: Insufficient documentation

## 2017-09-14 DIAGNOSIS — R519 Headache, unspecified: Secondary | ICD-10-CM | POA: Insufficient documentation

## 2017-09-26 ENCOUNTER — Telehealth: Payer: Self-pay

## 2017-09-26 NOTE — Telephone Encounter (Signed)
Due to patients Cardioversion on 09-28-2017 and her not being able to stop her thinners for at least 30 days per Ivor Costa NP we will move surgery to Feb-2019. This will put her in a safe date range to proceed with surgery. I spoke with her daughter Langley Gauss and she is aware. I also spoke with Marcene Brawn from Cardiology [phone] and she as well is aware.

## 2017-10-06 ENCOUNTER — Encounter: Payer: Self-pay | Admitting: Gastroenterology

## 2017-10-11 DIAGNOSIS — E119 Type 2 diabetes mellitus without complications: Secondary | ICD-10-CM | POA: Diagnosis not present

## 2017-10-11 DIAGNOSIS — E785 Hyperlipidemia, unspecified: Secondary | ICD-10-CM | POA: Diagnosis not present

## 2017-10-11 DIAGNOSIS — I10 Essential (primary) hypertension: Secondary | ICD-10-CM | POA: Diagnosis not present

## 2017-10-11 DIAGNOSIS — E039 Hypothyroidism, unspecified: Secondary | ICD-10-CM | POA: Diagnosis not present

## 2017-10-27 ENCOUNTER — Ambulatory Visit: Payer: Medicare Other | Admitting: Neurosurgery

## 2017-11-07 ENCOUNTER — Encounter: Payer: Self-pay | Admitting: Gastroenterology

## 2017-11-16 ENCOUNTER — Encounter: Payer: Self-pay | Admitting: Neurosurgery

## 2017-11-16 ENCOUNTER — Ambulatory Visit: Payer: Medicare Other | Attending: Neurosurgery | Admitting: Neurosurgery

## 2017-11-16 VITALS — BP 137/62 | HR 65 | Ht 66.0 in | Wt 207.0 lb

## 2017-11-16 DIAGNOSIS — R519 Headache, unspecified: Secondary | ICD-10-CM

## 2017-11-16 DIAGNOSIS — R51 Headache: Secondary | ICD-10-CM

## 2017-11-16 DIAGNOSIS — M899 Disorder of bone, unspecified: Secondary | ICD-10-CM

## 2017-11-16 MED ORDER — DOCUSATE SODIUM 100 MG PO CAPS *I*
100.0000 mg | ORAL_CAPSULE | Freq: Two times a day (BID) | ORAL | 0 refills | Status: AC
Start: 2017-11-16 — End: ?

## 2017-11-16 MED ORDER — OXYCODONE HCL 5 MG PO TABS *I*
5.0000 mg | ORAL_TABLET | Freq: Four times a day (QID) | ORAL | 0 refills | Status: DC | PRN
Start: 2017-11-16 — End: 2018-01-31

## 2017-11-16 NOTE — Progress Notes (Addendum)
Dear Dr. Rodena Piety,    I had the pleasure of seeing Stacie Kim today for preoperative counseling prior to elective craniectomy for R frontal skull lesion scheduled at [facility] on Monday 11/21/17.     History:     Stacie Kim is a 77 y.o. female with the chief complaint of lightheadedness.  Her history dates back to February of 2018. She states that at this time she fell. She states that she was out walking a dog when she fell on some ice and struck her head. She states that she did not go to the ER following this injury. She states that following this fall she had noted some daily headaches and lightheadedness. She states that she was taking tylenol daily. She states that in August she was hospitalized d/t hypokalemia, she states that following this incident the lightheadedness worsened. She states that at the time of the hospitalization she was worked up for cardiac issues. She states that she did not have any imaging of the head at the time of the hospitalization in August. Most recently, in October she fell and struck her head. She was again taken to the ER and at that time she had a CT scan of the head. She states that there was "an abnormality" noted in the R frontal region. She states that since this time the headaches have worsened, she has had nausea, balance difficulties and the lightheadedness has worsened. She has not had any more falls since the last fall in October. Stacie Kim is on Xarelto for A fib, she continues to take this medication.     At this time Stacie Kim's daughters are both with her at this time. Her daughters states that her forgetfulness has worsened. They states that she has some difficulty remembering what she is trying to say in conversation, no problems with word finding. Stacie Kim states that she has noted this as well. Stacie Kim states that she has noted tinnitus in the R ear, she denies any abnormal smells/odors. She denies any change in her taste. The  headaches are daily. She states that tylenol and rest does improve the symptoms. The headaches are located in the frontal region. Stacie Kim lives alone in a one story apartment, she lives on the 4th floor (there is an Media planner). Her daughters live close and provide a great deal of support. No hx of seizures, no weakness in the hands, no complaints of arm pain, neck pain, she does have trouble with balance. She does note that she has been sweating frequently. No recent unintentional weight loss. She does note some vertigo. She states that she has some blurred vision, no diplopia. (she has an appt with an eye doctor in the next few weeks). No hx of brain tumor, aneurysm, or bleeds.     Current Pain Control and Previous Treatment Modalities:                   Pain    11/16/17 0956   PainSc:   2   PainLoc: Head       Tylenol for headaches    Previous Spine Surgery:    none    Depression and/or Anxiety:    no    Tobacco History:    no    Alcohol and Drug History:    no    Occupation and Outside Interests:    Retired worked at [employer] and Social History:    Lives alone  in an apartment, has 5 children, 3 daughters live locally              Past Medical History:   Diagnosis Date    Breast cancer     Hypertension 01/01/2011    Palpitations (Symptom) 01/01/2011    Nuc stress                Past Surgical History:   Procedure Laterality Date    CHOLECYSTECTOMY  1967    HYSTERECTOMY  2006    Partial hystterectomy for uterine prolapse    SMALL BOWEL CAPSULE ENDO  07/10/2014         TONSILLECTOMY                  Allergies   Allergen Reactions    Codeine        Headache;     No Known Latex Allergy                 Current Outpatient Prescriptions   Medication    amLODIPine (NORVASC) 5 MG tablet    rivaroxaban (XARELTO) 15 MG tablet    potassium chloride SA (KLOR-CON M20) 20 mEq  tablet    furosemide (LASIX) 20 MG tablet    metoprolol (LOPRESSOR) 25 MG tablet    pantoprazole (PROTONIX) 20 MG EC tablet       oxyCODONE (ROXICODONE) 5 MG immediate release tablet    docusate sodium (COLACE) 100 MG capsule    meclizine (ANTIVERT) 25 MG tablet    polyethylene glycol (GOLYTELY) 236 GM suspension    lisinopril (PRINIVIL,ZESTRIL) 5 MG tablet    anastrozole (ARIMIDEX) 1 MG tablet    calcium carbonate-vitamin D 600-400 MG-UNIT per tablet    Multiple Vitamins-Minerals (MULTIVITAMIN PO)     No current facility-administered medications for this visit.          Review of Symptoms:    Twelve systems have been reviewed and pertinent positives are noted in her history above. Other positives are noted on her intake sheets which were filled out prior to her appointment and reviewed.    Physical Exam:       BP 137/62    Pulse 65    Ht 1.676 m (5\' 6" )    Wt 93.9 kg (207 lb)    BMI 33.41 kg/m            General:     Well-developed and in no acute distress.  Eyes: EOMI, sclera clear.  Cardiovascular: No cyanosis, good capillary refill. No edema.  Pulmonary: Clear, even, and easy respirations.  Skin: Warm, dry, and pink.     Musculoskeletal:     Range of motion in the neck is full and in the lower back is full.  There are no tender spots.  SLR is not present. Spurling's sign is not present.     There are no Waddell's signs which can sometimes be positive with somatization.      Focused Neurological Exam:      Alert and oriented to person, place and time. Speech is clear and fluent. Cranial nerves II-XII intact.      Strength bulk and tone were normal in both upper and lower extremities except for what is indicated below in the strength chart.  No pronator drift.  There is normal tone, no atrophy and no fasciculations.     Deltoid Biceps Triceps WE WF Int HF KF KE DF EHL PF   Right 5 5 5 5  5  5 5 5 5 5 5 5    Left 5 5 5 5 5 5 5 5 5 5 5 5      Sensation intact to pinprick, light touch, temperature and proprioception .    Reflexes with DTR's 2+ biceps, 2+ triceps, 2+ brachioradialis, 2+ patellar, 2+ Achilles.  No clonus. Toes are  downgoing.      Cerebellar with negative romberg and no dysmetria.    Gait is narrow-based and steady.  Tandem gait is steady.    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 female with a bony lesion noted in the right frontal skull. Dr Laureen Ochs did examine Stacie Kim at her previous visit on 09/13/17 and at that time he did discuss with her and her daughters different options for treating this lesion.  Dr Laureen Ochs discussed the procedure in detail with her and her daughter at that visit, I reviewed the procedure with them at this visit. We reviewed the risks and benefits of surgery in the clinic today. Risks of surgery include but are not limited to infection, bleeding, wound dehiscence, CSF leak, stroke, aphasia, paralysis, failure for surgery to provide relief of headache and dizziness, risks associated with anesthesia, DVT, MI, pneumonia, PE, coma, and death. She verbalized understanding of these risks and wishes to proceed as scheduled. I did remind Stacie Kim that the headaches and dizziness may NOT be associated with this lesion and if those symptoms do not improve following surgery she may need to explore other possible causes for this. She plans to stay with her daughter for 3-4 weeks after surgery. We did discuss possibility of d/c to acute rehab [facility]) if needed as well. Pain medications were sent to the pharmacy from the clinic. She will follow up in the office 3 weeks after surgery. She was reminded to cal [facility] Friday night before surgery on Monday morning to verify arrival time. Labs were reviewed. MRSA and MSSA negative. Medical and cardiac clearance have been obtained. She will stop the xarelto 5 days before surgery. She was advised to call the office with questions or concerns prior to her pre-op appointment in the clinic  with me.     The visit was 30 minutes, with greater than 50% of the time spent counseling and coordinating care.    Thank you for this opportunity to care for your patient.  If you have any questions about this patient or others and you wish to speak with me directly, please e-mail, use qliq or call or text my cell phone at any time:  [phone].    Sincerely,      Venetia Maxon, DNP, FNP-C    [email or web address]    cc. Elisha Ponder, [place]             Marice Potter, [facility]  Department of Neurosurgery  Director of [facility] and [facility]  [place]  [address]  [place], [place] [address]  [phone]

## 2017-11-21 ENCOUNTER — Encounter: Payer: Self-pay | Admitting: Gastroenterology

## 2017-11-21 DIAGNOSIS — R51 Headache: Secondary | ICD-10-CM

## 2017-11-21 DIAGNOSIS — M899 Disorder of bone, unspecified: Secondary | ICD-10-CM

## 2017-12-01 ENCOUNTER — Telehealth: Payer: Self-pay

## 2017-12-01 NOTE — Telephone Encounter (Signed)
Patient's daughter, Langley Gauss, called to ask if they could get the results of her biopsy.  Denise's number is [phone].    Eduard Clos Nirvaan Frett

## 2017-12-05 ENCOUNTER — Telehealth: Payer: Self-pay

## 2017-12-05 NOTE — Telephone Encounter (Signed)
Pt wants pathology results.  The daughter would also like a call back to discuss her incision.    [facility]

## 2017-12-06 ENCOUNTER — Telehealth: Payer: Self-pay

## 2017-12-06 NOTE — Telephone Encounter (Signed)
Daughter is concerned about a spot on her incision and she is also looking for the biopsy results.

## 2017-12-15 ENCOUNTER — Encounter: Payer: Self-pay | Admitting: Neurosurgery

## 2017-12-15 ENCOUNTER — Ambulatory Visit: Payer: Medicare Other | Attending: Neurosurgery | Admitting: Neurosurgery

## 2017-12-15 VITALS — BP 133/61 | HR 75 | Ht 66.0 in | Wt 207.0 lb

## 2017-12-15 DIAGNOSIS — M899 Disorder of bone, unspecified: Secondary | ICD-10-CM

## 2017-12-15 NOTE — Progress Notes (Signed)
Dear Dr. Rodena Piety,    I had the pleasure of seeing Stacie Kim today in follow up after resection of right frontal bone lesion.    Interval History:     MAYSA LYNN is a 77 y.o. female who presented with a bump on her forehead.  The CT scan showed a benign appearing lesion which we removed to confirm.  It was in fact benign.  She is doing well.  She still has some bumps and eschar along her wound.  She has been washing her hair.  Postoperative bruising has gone away.    Post-operative pain Control and/or Other Treatment Modalities:                   Pain    12/15/17 1103   PainSc:   0 - No pain     None    Changes in Medical and Social History:    None    Current Reported Medications per Patient:    Current Outpatient Prescriptions   Medication    amLODIPine (NORVASC) 5 MG tablet    rivaroxaban (XARELTO) 15 MG tablet    potassium chloride SA (KLOR-CON M20) 20 mEq  tablet    furosemide (LASIX) 20 MG tablet    meclizine (ANTIVERT) 25 MG tablet    polyethylene glycol (GOLYTELY) 236 GM suspension    lisinopril (PRINIVIL,ZESTRIL) 5 MG tablet    metoprolol (LOPRESSOR) 25 MG tablet    anastrozole (ARIMIDEX) 1 MG tablet    calcium carbonate-vitamin D 600-400 MG-UNIT per tablet    Multiple Vitamins-Minerals (MULTIVITAMIN PO)    pantoprazole (PROTONIX) 20 MG EC tablet    oxyCODONE (ROXICODONE) 5 MG immediate release tablet    docusate sodium (COLACE) 100 MG capsule     No current facility-administered medications for this visit.        Review of Symptoms:    Pertinent changes, both positive and negative, are included in the above history, and are otherwise unchanged from her preoperative status.      Physical Exam:   BP 133/61    Pulse 75    Ht 1.676 m (5\' 6" )    Wt 93.9 kg (207 lb)    BMI 33.41 kg/m         Height       Weight       Estimated body mass index is 33.41 kg/m as calculated from the following:    Height as of this encounter: 1.676 m (5\' 6" ).    Weight as of this encounter: 93.9 kg (207  lb).    General:     Well-developed and in no acute distress.  Eyes: EOMI, sclera clear.  Cardiovascular: No cyanosis, good capillary refill. No edema.  Pulmonary: Clear, even, and easy respirations.  Skin: Warm, dry, and pink.    Musculoskeletal:    Deferred    Wound:    Clean, Dry and Intact without erythema or discharge.    Focused Neurological Exam:      Orientation: Alert and Oriented to self, place and time and situation.     Strength: Deferred.    Sensation: Deferred.     Reflexes: Deferred.    Cerebellar: Deferred.    Gait: Normal.    Interval Imaging:      None    Impression and Plan:    MARCOS PELOSO is doing well since surgery. She may continue to increase activity as she feels capable.  I'm overall very pleased.  She feels  that she is 80% of baseline which is pretty normal at three weeks.  She requires two per day and fatigues easily, also quite normal.  I told her that it will take between six and twelve weeks before she is feeling back to her old self.    I would like to repeat CT scan without contrast in one year.     The visit was 20 minutes, with greater than 50% of the time spent counseling and coordinating care.    Thank you for this opportunity to care for your patient.  If you have any questions about this patient or others and you wish to speak with me directly, please call my cell phone at any time:  [phone].    Sincerely,      Ricard Dillon, MD on 12/15/2017 at 11:46 AM     cc:           Marice Potter, MD, [facility]  [address]  [place], [place] [address]  [phone]

## 2018-01-31 ENCOUNTER — Ambulatory Visit: Payer: Medicare Other | Attending: Neurosurgery | Admitting: Neurosurgery

## 2018-01-31 ENCOUNTER — Encounter: Payer: Self-pay | Admitting: Neurosurgery

## 2018-01-31 VITALS — BP 134/61 | HR 59 | Ht 66.0 in | Wt 207.0 lb

## 2018-01-31 DIAGNOSIS — M899 Disorder of bone, unspecified: Secondary | ICD-10-CM

## 2018-01-31 NOTE — Progress Notes (Signed)
Dear Dr. Rodena Piety,    I had the pleasure of seeing Stacie Kim today in follow up after resection of a benign right frontal skull lesion on November 21, 2017.    Interval History:     Stacie Kim is a 77 y.o. female who presented with localized pain in her right four head.  This was likely related to a small benign bone lesion.  This was resected through an incision just behind the hairline.  She has done well.  Over the past week or so, she's developed a spot on the incision that has become painful.  They sent me a picture to my cell phone and I asked her to come in today.  She is also had shooting pains on the right side of her head as well as some numbness.  She has had no fevers and other than fatigue, she feels okay.    Post-operative pain Control and/or Other Treatment Modalities:                   Pain    01/31/18 1421   PainSc:   9   PainLoc: Head     None    Changes in Medical and Social History:    None    Current Reported Medications per Patient:    Current Outpatient Prescriptions   Medication    docusate sodium (COLACE) 100 MG capsule    amLODIPine (NORVASC) 5 MG tablet    rivaroxaban (XARELTO) 15 MG tablet    potassium chloride SA (KLOR-CON M20) 20 mEq  tablet    furosemide (LASIX) 20 MG tablet    meclizine (ANTIVERT) 25 MG tablet    polyethylene glycol (GOLYTELY) 236 GM suspension    lisinopril (PRINIVIL,ZESTRIL) 5 MG tablet    metoprolol (LOPRESSOR) 25 MG tablet    anastrozole (ARIMIDEX) 1 MG tablet    calcium carbonate-vitamin D 600-400 MG-UNIT per tablet    Multiple Vitamins-Minerals (MULTIVITAMIN PO)    pantoprazole (PROTONIX) 20 MG EC tablet     No current facility-administered medications for this visit.        Review of Symptoms:    Pertinent changes, both positive and negative, are included in the above history, and are otherwise unchanged from her preoperative status.      Physical Exam:   BP 134/61    Pulse 59    Ht 1.676 m (5\' 6" )    Wt 93.9 kg (207 lb)    BMI 33.41 kg/m          Height       Weight       Estimated body mass index is 33.41 kg/m as calculated from the following:    Height as of this encounter: 1.676 m (5\' 6" ).    Weight as of this encounter: 93.9 kg (207 lb).    General:     Well-developed and in no acute distress.  Eyes: EOMI, sclera clear.  Cardiovascular: No cyanosis, good capillary refill. No edema.  Pulmonary: Clear, even, and easy respirations.  Skin: Warm, dry, and pink.    Musculoskeletal:    Bulk and tone remain normal.  Range of motion:  Full range of motion in neck and back unless in a brace.    Wound:    Clean, Dry and Intact with a small raised tender spots in the middle of the wound.  It is dark blue at the surface and upon closer inspection, it looks almost like a small blood blister.  It does not appear infected.    Focused Neurological Exam:      Orientation: Alert and Oriented to self, place and time and situation.     Strength: Deferred.    Sensation: Defered.     Reflexes: Deferred.    Cerebellar: Deferred.    Gait: Normal.    Interval Imaging:      None.    Impression and Plan:    Stacie Kim is doing well since surgery.  I'm not sure what this might represent other than a reaction to a subcutaneous stitch which is pretty common.  They typically appear as a stitch abscess which is really localized inflammation that generally does not spread.  I think at this point I will just keep an eye on it and her daughter will send be another picture in a week unless something changes.  If it doesn't improve, I will consider lancing it.    The visit was 20 minutes, with greater than 50% of the time spent counseling and coordinating care.    Thank you for this opportunity to care for your patient.  If you have any questions about this patient or others and you wish to speak with me directly, please call my cell phone at any time:  [phone].    Sincerely,      Ricard Dillon, MD on 01/31/2018 at 3:15 PM     cc:           Marice Potter, MD,  [facility] [place]  [address]  [place], [place] [address]  [phone]

## 2018-05-25 IMAGING — CT CT ABD-PELV W/ CM
2 of 5 series · 3 of 46 positions shown, 4 images · IV contrast (Iodine)
Comparison: None.

CLINICAL DATA: History of non-Hodgkin's lymphoma. Appendectomy,
bilateral oophorectomy.

EXAM:
CT ABDOMEN AND PELVIS WITH CONTRAST
TECHNIQUE: Multidetector CT imaging of the abdomen and pelvis was performed
using the standard protocol following bolus administration of
intravenous contrast.
CONTRAST:  100mL 286QAT-3II IOPAMIDOL (286QAT-3II) INJECTION 61%

[Series 204: cor · coronal · 0.45mm/px · 2 of 125 slices shown, 3 images]
[im 42/125  soft-tissue]
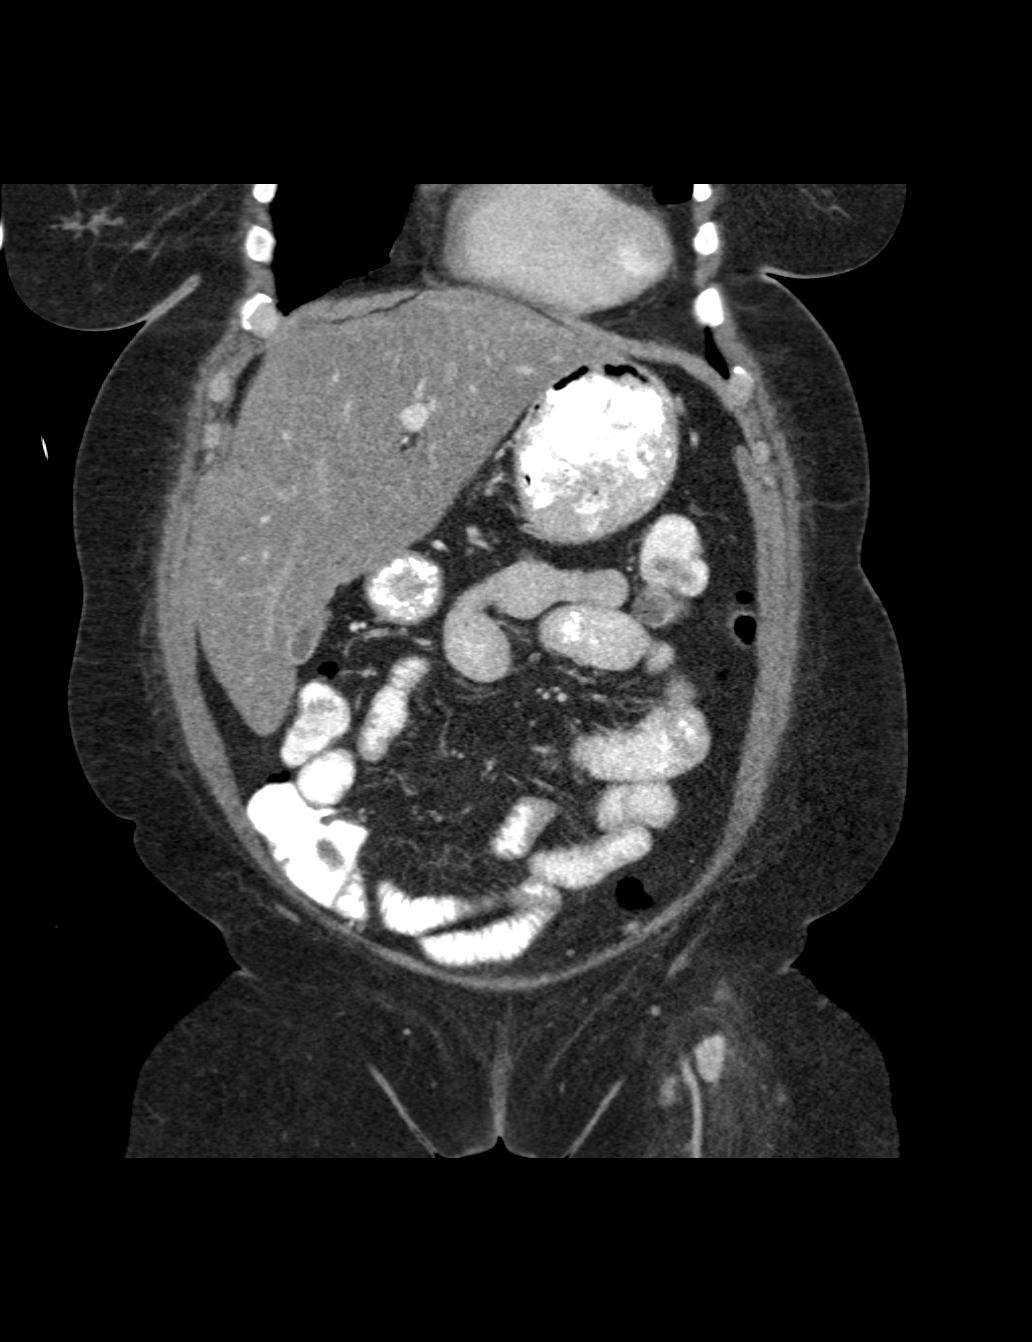
[im 42/125  bone]
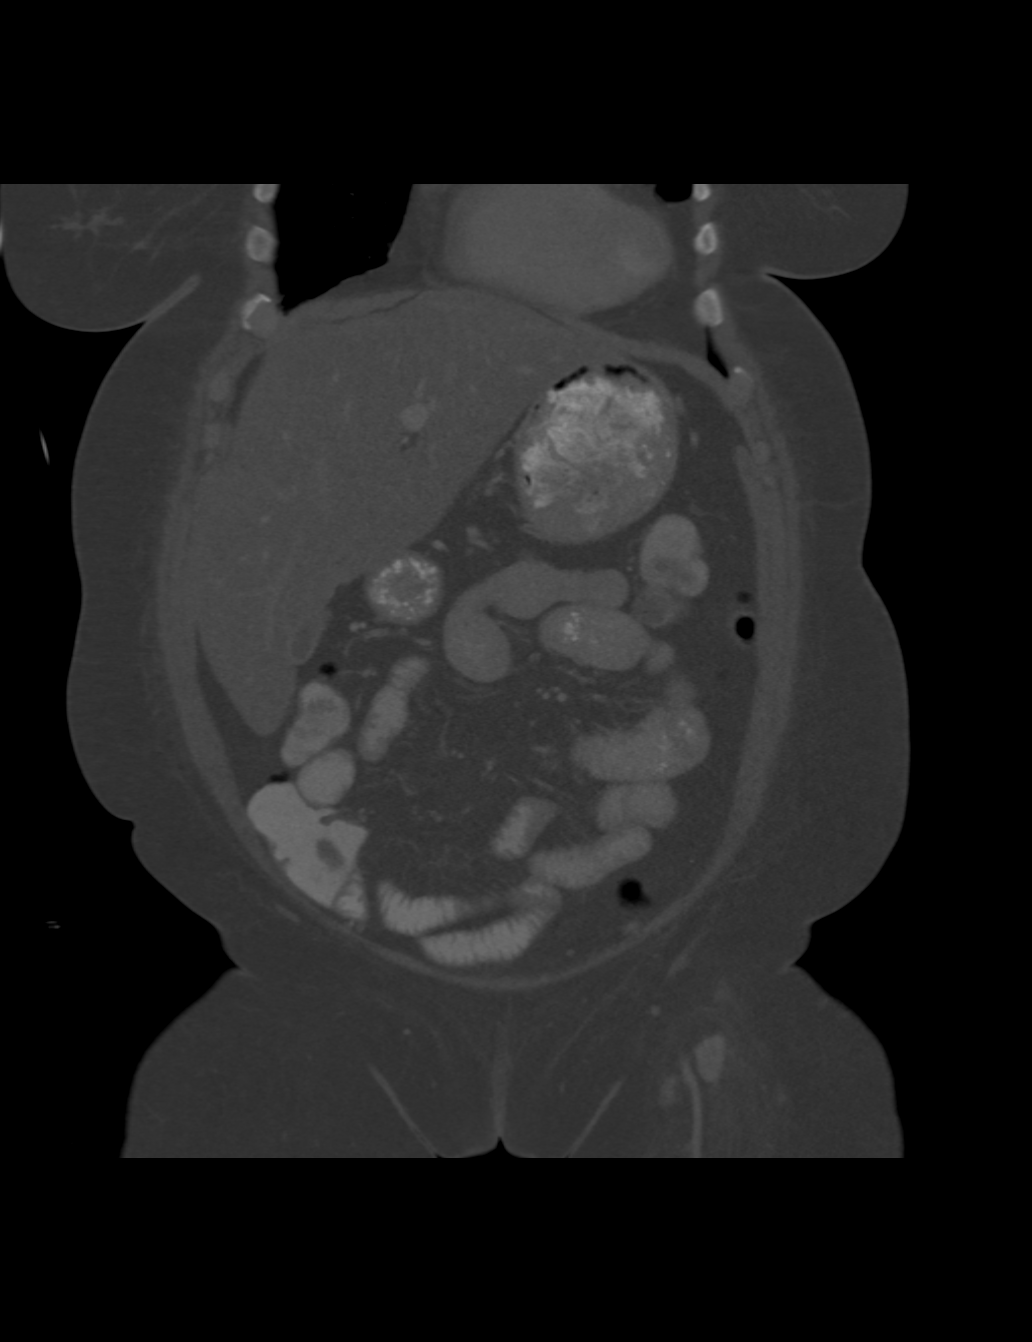
[im 97/125  soft-tissue]
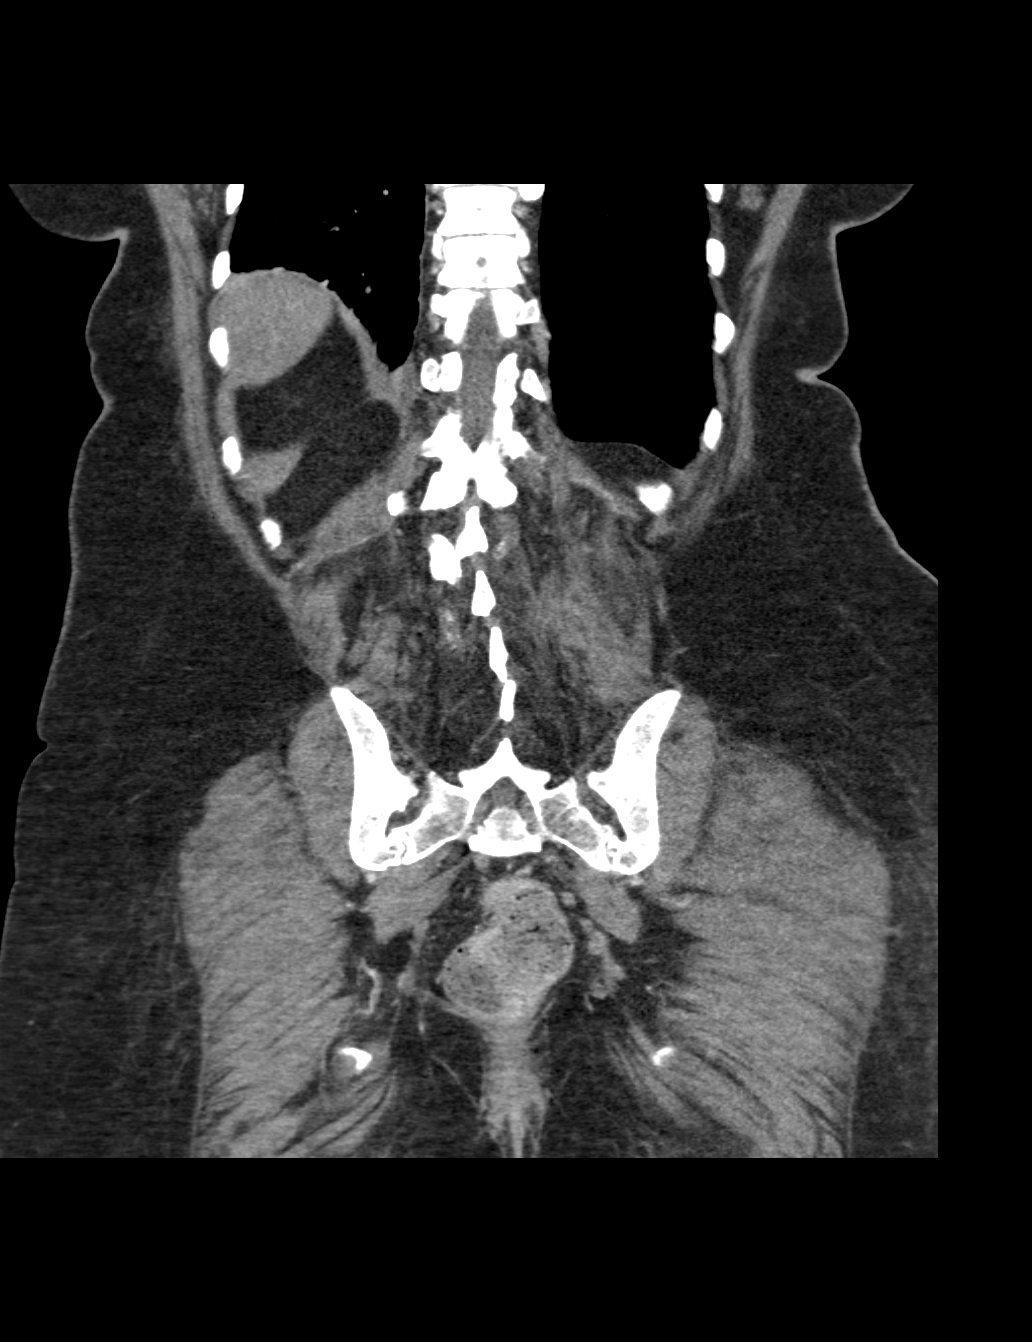

[Series 205: sag · sagittal · 0.45mm/px · 1 of 144 slices shown]
[im 48/144  soft-tissue]
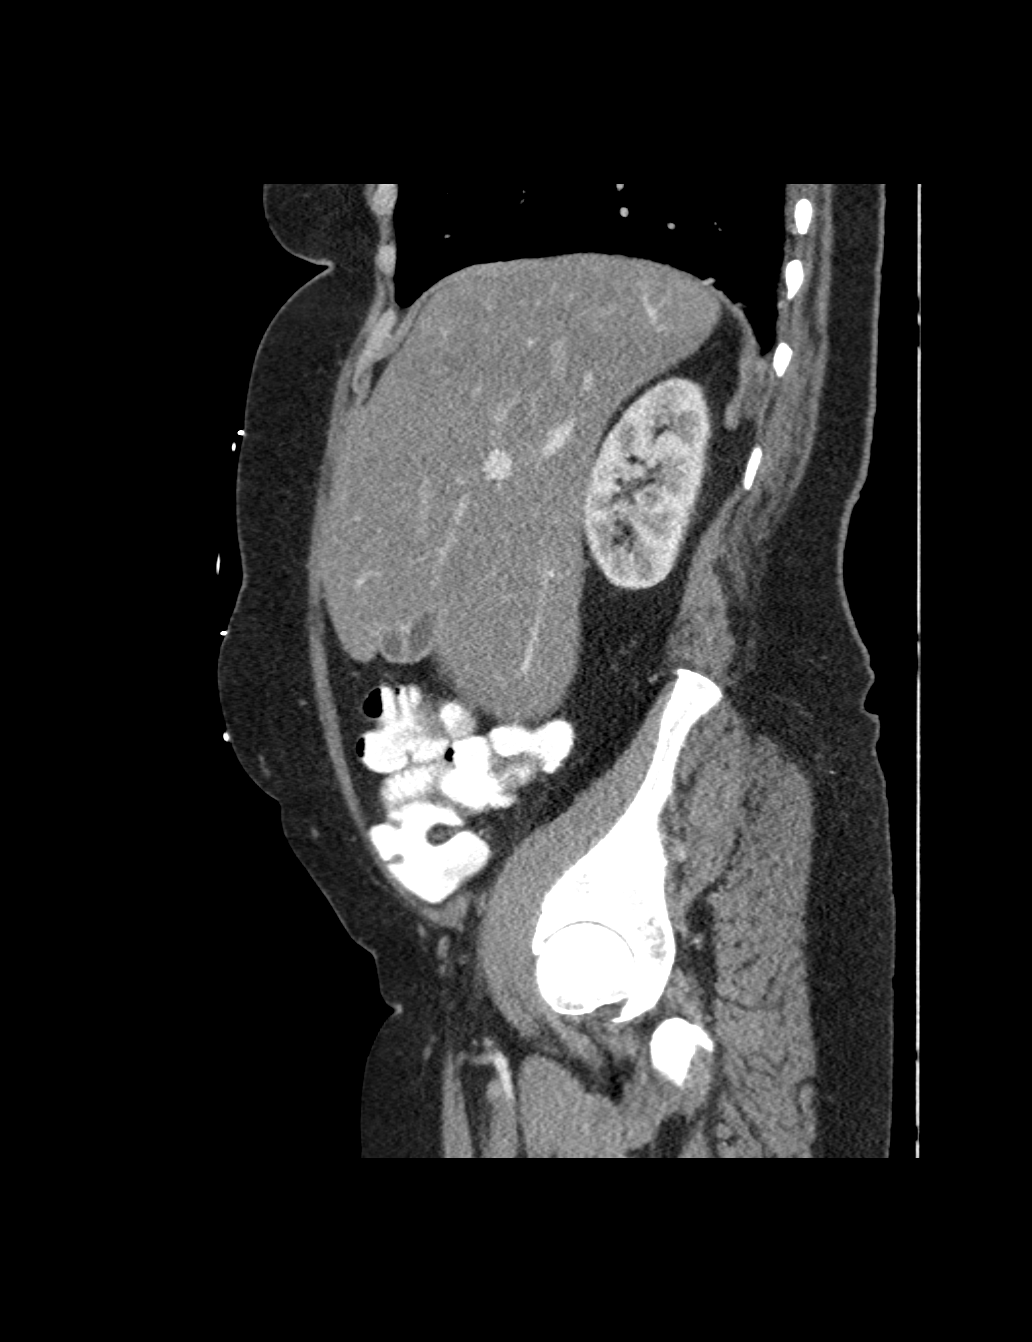

[3 of 46 positions shown; findings below may reference images not displayed]

FINDINGS: Lower chest: Lung bases are clear.

Hepatobiliary: Diffuse low-attenuation within liver. No focal
hepatic lesion. Normal gallbladder.

Pancreas: Pancreas is normal. No ductal dilatation. No pancreatic
inflammation.

Spleen: Normal spleen

Adrenals/urinary tract: Adrenal glands and kidneys are normal. The
ureters and bladder normal.

Stomach/Bowel: Stomach, small bowel, and cecum are normal. The colon
and rectosigmoid colon are normal.

Vascular/Lymphatic: Abdominal aorta is normal caliber. There is no
retroperitoneal lymphadenopathy. No pelvic lymphadenopathy. No
inguinal lymphadenopathy.

Reproductive: Post hysterectomy.

Other: No free fluid.

Musculoskeletal: No aggressive osseous lesion.
IMPRESSION: 1. No evidence of lymphadenopathy.
2. Normal spleen.
3. Hepatic steatosis.

## 2018-12-19 ENCOUNTER — Ambulatory Visit: Payer: Medicare Other | Admitting: Neurosurgery

## 2018-12-27 ENCOUNTER — Ambulatory Visit: Payer: Medicare Other | Admitting: Neurosurgery

## 2019-12-27 ENCOUNTER — Other Ambulatory Visit: Payer: Self-pay | Admitting: Pulmonary Disease

## 2019-12-27 DIAGNOSIS — Z23 Encounter for immunization: Secondary | ICD-10-CM

## 2023-11-06 ENCOUNTER — Inpatient Hospital Stay (HOSPITAL_COMMUNITY): Payer: Medicare Other

## 2023-11-06 ENCOUNTER — Other Ambulatory Visit: Payer: Self-pay

## 2023-11-06 ENCOUNTER — Inpatient Hospital Stay (HOSPITAL_COMMUNITY)
Admission: EM | Admit: 2023-11-06 | Discharge: 2023-11-10 | DRG: 871 | Disposition: A | Discharge status: Nursing Home (Includes ICF) | Payer: Medicare PPO | Attending: Internal Medicine | Admitting: Internal Medicine

## 2023-11-06 ENCOUNTER — Emergency Department (HOSPITAL_COMMUNITY): Payer: Medicare Other

## 2023-11-06 ENCOUNTER — Encounter (HOSPITAL_COMMUNITY): Payer: Self-pay

## 2023-11-06 DIAGNOSIS — E872 Acidosis, unspecified: Secondary | ICD-10-CM | POA: Diagnosis present

## 2023-11-06 DIAGNOSIS — A4189 Other specified sepsis: Principal | ICD-10-CM | POA: Diagnosis present

## 2023-11-06 DIAGNOSIS — I2510 Atherosclerotic heart disease of native coronary artery without angina pectoris: Secondary | ICD-10-CM | POA: Diagnosis present

## 2023-11-06 DIAGNOSIS — I10 Essential (primary) hypertension: Secondary | ICD-10-CM | POA: Diagnosis present

## 2023-11-06 DIAGNOSIS — J101 Influenza due to other identified influenza virus with other respiratory manifestations: Secondary | ICD-10-CM | POA: Diagnosis present

## 2023-11-06 DIAGNOSIS — E1165 Type 2 diabetes mellitus with hyperglycemia: Secondary | ICD-10-CM | POA: Diagnosis present

## 2023-11-06 DIAGNOSIS — E871 Hypo-osmolality and hyponatremia: Secondary | ICD-10-CM | POA: Diagnosis present

## 2023-11-06 DIAGNOSIS — Z23 Encounter for immunization: Secondary | ICD-10-CM

## 2023-11-06 DIAGNOSIS — R197 Diarrhea, unspecified: Secondary | ICD-10-CM | POA: Diagnosis present

## 2023-11-06 DIAGNOSIS — Z85828 Personal history of other malignant neoplasm of skin: Secondary | ICD-10-CM

## 2023-11-06 DIAGNOSIS — A419 Sepsis, unspecified organism: Secondary | ICD-10-CM | POA: Diagnosis present

## 2023-11-06 DIAGNOSIS — C859A Non-Hodgkin lymphoma, unspecified, in remission: Secondary | ICD-10-CM | POA: Diagnosis present

## 2023-11-06 DIAGNOSIS — E876 Hypokalemia: Secondary | ICD-10-CM | POA: Diagnosis not present

## 2023-11-06 DIAGNOSIS — Z7982 Long term (current) use of aspirin: Secondary | ICD-10-CM

## 2023-11-06 DIAGNOSIS — E785 Hyperlipidemia, unspecified: Secondary | ICD-10-CM | POA: Diagnosis present

## 2023-11-06 DIAGNOSIS — Z7902 Long term (current) use of antithrombotics/antiplatelets: Secondary | ICD-10-CM

## 2023-11-06 DIAGNOSIS — R6520 Severe sepsis without septic shock: Secondary | ICD-10-CM | POA: Diagnosis present

## 2023-11-06 DIAGNOSIS — Z794 Long term (current) use of insulin: Secondary | ICD-10-CM

## 2023-11-06 DIAGNOSIS — Z79899 Other long term (current) drug therapy: Secondary | ICD-10-CM

## 2023-11-06 DIAGNOSIS — Z993 Dependence on wheelchair: Secondary | ICD-10-CM | POA: Diagnosis not present

## 2023-11-06 DIAGNOSIS — Z66 Do not resuscitate: Secondary | ICD-10-CM | POA: Diagnosis present

## 2023-11-06 DIAGNOSIS — Z87891 Personal history of nicotine dependence: Secondary | ICD-10-CM | POA: Diagnosis not present

## 2023-11-06 DIAGNOSIS — E66811 Obesity, class 1: Secondary | ICD-10-CM | POA: Diagnosis present

## 2023-11-06 DIAGNOSIS — E1122 Type 2 diabetes mellitus with diabetic chronic kidney disease: Secondary | ICD-10-CM

## 2023-11-06 DIAGNOSIS — I69354 Hemiplegia and hemiparesis following cerebral infarction affecting left non-dominant side: Secondary | ICD-10-CM | POA: Diagnosis not present

## 2023-11-06 DIAGNOSIS — G9341 Metabolic encephalopathy: Secondary | ICD-10-CM

## 2023-11-06 DIAGNOSIS — Z6833 Body mass index (BMI) 33.0-33.9, adult: Secondary | ICD-10-CM

## 2023-11-06 DIAGNOSIS — Z833 Family history of diabetes mellitus: Secondary | ICD-10-CM

## 2023-11-06 DIAGNOSIS — Z90710 Acquired absence of both cervix and uterus: Secondary | ICD-10-CM

## 2023-11-06 DIAGNOSIS — Z1152 Encounter for screening for COVID-19: Secondary | ICD-10-CM

## 2023-11-06 DIAGNOSIS — R531 Weakness: Secondary | ICD-10-CM | POA: Diagnosis not present

## 2023-11-06 DIAGNOSIS — Z880 Allergy status to penicillin: Secondary | ICD-10-CM

## 2023-11-06 DIAGNOSIS — D509 Iron deficiency anemia, unspecified: Secondary | ICD-10-CM | POA: Diagnosis present

## 2023-11-06 DIAGNOSIS — E860 Dehydration: Secondary | ICD-10-CM | POA: Diagnosis present

## 2023-11-06 LAB — RESP PANEL BY RT-PCR (RSV, FLU A&B, COVID)  RVPGX2
Influenza A by PCR: NEGATIVE
Influenza B by PCR: NEGATIVE
Resp Syncytial Virus by PCR: NEGATIVE
SARS Coronavirus 2 by RT PCR: NEGATIVE

## 2023-11-06 LAB — URINALYSIS, W/ REFLEX TO CULTURE (INFECTION SUSPECTED)
Bacteria, UA: NONE SEEN
Bilirubin Urine: NEGATIVE
Glucose, UA: >=500 mg/dL — AB
Ketones, ur: NEGATIVE mg/dL
Leukocytes,Ua: NEGATIVE
Nitrite: NEGATIVE
Protein, ur: NEGATIVE mg/dL
Specific Gravity, Urine: 1.009 (ref 1.005–1.030)
pH: 5 (ref 5.0–8.0)

## 2023-11-06 LAB — MAGNESIUM: Magnesium: 1.5 mg/dL — ABNORMAL LOW (ref 1.7–2.4)

## 2023-11-06 LAB — HEMOGLOBIN AND HEMATOCRIT, BLOOD
HCT: 27 % — ABNORMAL LOW (ref 36.0–46.0)
Hemoglobin: 7.6 g/dL — ABNORMAL LOW (ref 12.0–15.0)

## 2023-11-06 LAB — PROTIME-INR
INR: 1.1 (ref 0.8–1.2)
Prothrombin Time: 13.9 s (ref 11.4–15.2)

## 2023-11-06 LAB — COMPREHENSIVE METABOLIC PANEL
ALT: 16 U/L (ref 0–44)
AST: 21 U/L (ref 15–41)
Albumin: 3.5 g/dL (ref 3.5–5.0)
Alkaline Phosphatase: 102 U/L (ref 38–126)
Anion gap: 10 (ref 5–15)
BUN: 14 mg/dL (ref 8–23)
CO2: 22 mmol/L (ref 22–32)
Calcium: 8.2 mg/dL — ABNORMAL LOW (ref 8.9–10.3)
Chloride: 102 mmol/L (ref 98–111)
Creatinine, Ser: 0.93 mg/dL (ref 0.44–1.00)
GFR, Estimated: >60 mL/min (ref >60)
Glucose, Bld: 279 mg/dL — ABNORMAL HIGH (ref 70–99)
Potassium: 3.5 mmol/L (ref 3.5–5.1)
Sodium: 134 mmol/L — ABNORMAL LOW (ref 135–145)
Total Bilirubin: 0.7 mg/dL (ref 0.0–1.2)
Total Protein: 7.1 g/dL (ref 6.5–8.1)

## 2023-11-06 LAB — CBC WITH DIFFERENTIAL/PLATELET
Abs Immature Granulocytes: 0.03 10*3/uL (ref 0.00–0.07)
Basophils Absolute: 0 10*3/uL (ref 0.0–0.1)
Basophils Relative: 0 %
Eosinophils Absolute: 0 10*3/uL (ref 0.0–0.5)
Eosinophils Relative: 0 %
HCT: 26.2 % — ABNORMAL LOW (ref 36.0–46.0)
Hemoglobin: 7.3 g/dL — ABNORMAL LOW (ref 12.0–15.0)
Immature Granulocytes: 1 %
Lymphocytes Relative: 13 %
Lymphs Abs: 0.6 10*3/uL — ABNORMAL LOW (ref 0.7–4.0)
MCH: 17.4 pg — ABNORMAL LOW (ref 26.0–34.0)
MCHC: 27.9 g/dL — ABNORMAL LOW (ref 30.0–36.0)
MCV: 62.5 fL — ABNORMAL LOW (ref 80.0–100.0)
Monocytes Absolute: 0.5 10*3/uL (ref 0.1–1.0)
Monocytes Relative: 11 %
Neutro Abs: 3.5 10*3/uL (ref 1.7–7.7)
Neutrophils Relative %: 75 %
Platelets: 228 10*3/uL (ref 150–400)
RBC: 4.19 MIL/uL (ref 3.87–5.11)
RDW: 20.8 % — ABNORMAL HIGH (ref 11.5–15.5)
WBC: 4.7 10*3/uL (ref 4.0–10.5)
nRBC: 0.4 % — ABNORMAL HIGH (ref 0.0–0.2)

## 2023-11-06 LAB — TSH: TSH: 2.828 u[IU]/mL (ref 0.350–4.500)

## 2023-11-06 LAB — VITAMIN B12: Vitamin B-12: 216 pg/mL (ref 180–914)

## 2023-11-06 LAB — VITAMIN D 25 HYDROXY (VIT D DEFICIENCY, FRACTURES): Vit D, 25-Hydroxy: 36.46 ng/mL (ref 30–100)

## 2023-11-06 LAB — APTT: aPTT: 35 s (ref 24–36)

## 2023-11-06 LAB — PHOSPHORUS: Phosphorus: 2.1 mg/dL — ABNORMAL LOW (ref 2.5–4.6)

## 2023-11-06 LAB — IRON AND TIBC
Iron: 22 ug/dL — ABNORMAL LOW (ref 28–170)
Saturation Ratios: 5 % — ABNORMAL LOW (ref 10.4–31.8)
TIBC: 419 ug/dL (ref 250–450)
UIBC: 397 ug/dL

## 2023-11-06 LAB — I-STAT CG4 LACTIC ACID, ED
Lactic Acid, Venous: 2 mmol/L (ref 0.5–1.9)
Lactic Acid, Venous: 1.8 mmol/L (ref 0.5–1.9)

## 2023-11-06 LAB — FERRITIN: Ferritin: 6 ng/mL — ABNORMAL LOW (ref 11–307)

## 2023-11-06 LAB — CBG MONITORING, ED: Glucose-Capillary: 163 mg/dL — ABNORMAL HIGH (ref 70–99)

## 2023-11-06 LAB — POC OCCULT BLOOD, ED: Fecal Occult Bld: NEGATIVE

## 2023-11-06 MED ORDER — VANCOMYCIN HCL 1250 MG/250ML IV SOLN: 1250.0000 mg | INTRAVENOUS | Status: DC

## 2023-11-06 MED ORDER — SODIUM CHLORIDE 0.9 % IV SOLN
2.0000 g | INTRAVENOUS | Status: AC
  Administered 2023-11-06: 2 g via INTRAVENOUS
  Filled 2023-11-06 (×2): qty 12.5

## 2023-11-06 MED ORDER — ONDANSETRON HCL 4 MG/2ML IJ SOLN: 4.0000 mg | Freq: Four times a day (QID) | INTRAMUSCULAR | Status: DC | PRN

## 2023-11-06 MED ORDER — ACETAMINOPHEN 650 MG RE SUPP
650.0000 mg | Freq: Four times a day (QID) | RECTAL | Status: DC | PRN
  Administered 2023-11-07: 650 mg via RECTAL
  Filled 2023-11-06: qty 1

## 2023-11-06 MED ORDER — SENNOSIDES-DOCUSATE SODIUM 8.6-50 MG PO TABS: 1.0000 | ORAL_TABLET | Freq: Every evening | ORAL | Status: DC | PRN

## 2023-11-06 MED ORDER — ATORVASTATIN CALCIUM 40 MG PO TABS
40.0000 mg | ORAL_TABLET | Freq: Every day | ORAL | Status: DC
Start: 2023-11-06 — End: 2023-11-10
  Administered 2023-11-07 – 2023-11-09 (×3): 40 mg via ORAL
  Filled 2023-11-06 (×4): qty 1

## 2023-11-06 MED ORDER — METRONIDAZOLE 500 MG/100ML IV SOLN
500.0000 mg | Freq: Two times a day (BID) | INTRAVENOUS | Status: DC
  Administered 2023-11-07: 500 mg via INTRAVENOUS
  Filled 2023-11-06: qty 100

## 2023-11-06 MED ORDER — LACTATED RINGERS IV BOLUS (SEPSIS)
1000.0000 mL | Freq: Once | INTRAVENOUS | Status: AC
  Administered 2023-11-06: 1000 mL via INTRAVENOUS

## 2023-11-06 MED ORDER — METRONIDAZOLE 500 MG/100ML IV SOLN
500.0000 mg | Freq: Once | INTRAVENOUS | Status: AC
  Administered 2023-11-06: 500 mg via INTRAVENOUS
  Filled 2023-11-06: qty 100

## 2023-11-06 MED ORDER — VANCOMYCIN HCL IN DEXTROSE 1-5 GM/200ML-% IV SOLN
1000.0000 mg | Freq: Once | INTRAVENOUS | Status: AC
  Administered 2023-11-06: 1000 mg via INTRAVENOUS
  Filled 2023-11-06: qty 200

## 2023-11-06 MED ORDER — MAGNESIUM SULFATE 2 GM/50ML IV SOLN
2.0000 g | Freq: Once | INTRAVENOUS | Status: AC
  Administered 2023-11-07: 2 g via INTRAVENOUS
  Filled 2023-11-06: qty 50

## 2023-11-06 MED ORDER — SODIUM CHLORIDE 0.9 % IV SOLN: 2.0000 g | Freq: Once | INTRAVENOUS | Status: DC

## 2023-11-06 MED ORDER — ACETAMINOPHEN 325 MG PO TABS
650.0000 mg | ORAL_TABLET | Freq: Four times a day (QID) | ORAL | Status: DC | PRN
  Administered 2023-11-07: 650 mg via ORAL
  Filled 2023-11-06: qty 2

## 2023-11-06 MED ORDER — INSULIN GLARGINE-YFGN 100 UNIT/ML ~~LOC~~ SOLN
18.0000 [IU] | Freq: Every day | SUBCUTANEOUS | Status: DC
Start: 2023-11-06 — End: 2023-11-08
  Administered 2023-11-07 (×2): 18 [IU] via SUBCUTANEOUS
  Filled 2023-11-06 (×3): qty 0.18

## 2023-11-06 MED ORDER — ACETAMINOPHEN 500 MG PO TABS
1000.0000 mg | ORAL_TABLET | Freq: Once | ORAL | Status: AC
  Filled 2023-11-06: qty 2

## 2023-11-06 MED ORDER — SODIUM CHLORIDE 0.9 % IV SOLN
30.0000 mmol | Freq: Once | INTRAVENOUS | Status: AC
  Administered 2023-11-07: 30 mmol via INTRAVENOUS
  Filled 2023-11-06: qty 10

## 2023-11-06 MED ORDER — LACTATED RINGERS IV SOLN: INTRAVENOUS | Status: AC

## 2023-11-06 MED ORDER — ASPIRIN 81 MG PO TBEC
81.0000 mg | DELAYED_RELEASE_TABLET | Freq: Every day | ORAL | Status: DC
  Administered 2023-11-07 – 2023-11-10 (×4): 81 mg via ORAL
  Filled 2023-11-06 (×4): qty 1

## 2023-11-06 MED ORDER — IOHEXOL 300 MG/ML  SOLN
100.0000 mL | Freq: Once | INTRAMUSCULAR | Status: AC | PRN
  Administered 2023-11-06: 100 mL via INTRAVENOUS

## 2023-11-06 MED ORDER — ONDANSETRON HCL 4 MG PO TABS: 4.0000 mg | ORAL_TABLET | Freq: Four times a day (QID) | ORAL | Status: DC | PRN

## 2023-11-06 MED ORDER — SODIUM CHLORIDE 0.9 % IV SOLN
2.0000 g | Freq: Two times a day (BID) | INTRAVENOUS | Status: DC
  Administered 2023-11-07: 2 g via INTRAVENOUS
  Filled 2023-11-06: qty 12.5

## 2023-11-06 MED ORDER — ACETAMINOPHEN 500 MG PO TABS
ORAL_TABLET | ORAL | Status: AC
  Administered 2023-11-06: 1000 mg via ORAL
  Filled 2023-11-06: qty 2

## 2023-11-06 MED ORDER — SODIUM CHLORIDE 0.9 % IV BOLUS
1000.0000 mL | Freq: Once | INTRAVENOUS | Status: AC
  Administered 2023-11-06: 1000 mL via INTRAVENOUS

## 2023-11-06 MED ORDER — CLOPIDOGREL BISULFATE 75 MG PO TABS
75.0000 mg | ORAL_TABLET | Freq: Every day | ORAL | Status: DC
  Administered 2023-11-07 – 2023-11-10 (×4): 75 mg via ORAL
  Filled 2023-11-06 (×5): qty 1

## 2023-11-06 MED ORDER — INSULIN ASPART 100 UNIT/ML IJ SOLN
0.0000 [IU] | INTRAMUSCULAR | Status: DC
  Administered 2023-11-07: 3 [IU] via SUBCUTANEOUS
  Administered 2023-11-07: 5 [IU] via SUBCUTANEOUS
  Administered 2023-11-07: 2 [IU] via SUBCUTANEOUS
  Administered 2023-11-07: 3 [IU] via SUBCUTANEOUS
  Administered 2023-11-07: 2 [IU] via SUBCUTANEOUS
  Administered 2023-11-07: 5 [IU] via SUBCUTANEOUS
  Administered 2023-11-08 – 2023-11-09 (×7): 3 [IU] via SUBCUTANEOUS
  Administered 2023-11-09: 2 [IU] via SUBCUTANEOUS
  Administered 2023-11-09 (×2): 3 [IU] via SUBCUTANEOUS
  Administered 2023-11-09: 2 [IU] via SUBCUTANEOUS
  Administered 2023-11-09 – 2023-11-10 (×2): 3 [IU] via SUBCUTANEOUS
  Administered 2023-11-10 (×2): 2 [IU] via SUBCUTANEOUS
  Administered 2023-11-10: 3 [IU] via SUBCUTANEOUS
  Filled 2023-11-06: qty 0.15

## 2023-11-06 NOTE — ED Notes (Signed)
 Pt heading to CT for scans.

## 2023-11-06 NOTE — Progress Notes (Signed)
 Elink following for sepsis protocol.

## 2023-11-06 NOTE — ED Triage Notes (Signed)
 Pt arrived with EMS reporting fever and weakness. Sent by facility. Denies any other concerns

## 2023-11-06 NOTE — H&P (Signed)
 History and Physical    Patient: Marisa Davenport FMW:[id number] DOB: 02-16-1941 DOA: 11/06/2023 DOS: the patient was seen and examined on 11/06/2023 PCP: [facility]  Patient coming from: ALF/ILF  Chief Complaint:  Chief Complaint  Patient presents with   Altered Mental Status   Weakness   HPI: Marisa Davenport is a 83 y.o. female with medical history significant of non-Hodgkin's lymphoma in remission, T2DM, HTN, HLD, and CVA with residual left-sided weakness who presents from her assisted living facility for evaluation of fevers and generalized weakness. Per sister, patient has been less interactive with staff over the last few days.  She has also been weak and had a fever of 103.5 at her facility so EMS was called.  Reports that patient has had a dry cough since yesterday. Patient drowsy on evaluation but able to answer questions with some delayed thought process. She she reports feeling weak but denies any abdominal pain, nausea, vomiting, dysuria, chest pain or shortness of breath. Per sister, patient had a similar presentation last time she had a sepsis. Patient currently lives in assisted living facility and mostly ambulates with a wheelchair but sometimes does some walking with PT.  ED course: Initial vitals shows temp 102.3, RR 23, HR 118, BP 166/70 and SpO2 100% on room air Labs show sodium 134, K+ 3.5, glucose 279, creatinine 0.93, WBC 4.7, Hgb 7.3 (11.9 7 years ago), platelet 228, PT/INR 13.9/1.1, lactic acid 2.0->1.8, negative fecal occult test, negative flu, RSV and COVID test, UA shows significant glucosuria and moderate hemoglobinuria but no signs of infection. Chest x-ray with no active disease Sepsis protocol was activated and patient received IV fluids, IV vancomycin , cefepime  and Flagyl  TRH was consulted for admission  Review of Systems: As mentioned in the history of present illness. All other systems reviewed and are negative. Past Medical History:  Diagnosis Date    Allergy    Bilateral leg edema 03/2016   Bursitis left hip   Cat scratch of left lower leg 03/2016   Cellulitis 03/2016   LEFT LOWER LEG   Diabetes mellitus, type II (HCC)    Elevated BP    Hyperlipidemia    Non Hodgkin's lymphoma (HCC) 1992    resolved    Past Surgical History:  Procedure Laterality Date   ABDOMINAL HYSTERECTOMY     APPENDECTOMY     BILATERAL OOPHORECTOMY     colonscopy  01-13-09   per Dr. Jakie, diverticulosis only, repeat in 10 years    DIAGNOSTIC MAMMOGRAM  09/05/09   removal of basal cell carcinoma from left upper arm  2009   per Dr. Ivin FRIED CUFF REPAIR Right 2010 ?    Social History:  reports that she has quit smoking. She has never used smokeless tobacco. She reports current alcohol use of about 7.0 standard drinks of alcohol per week. She reports that she does not use drugs.  Allergies  Allergen Reactions   Penicillins Other (See Comments)    Hives    Family History  Problem Relation Age of Onset   Cancer Sister    Heart disease Other    Diabetes Other     Prior to Admission medications   Medication Sig Start Date End Date Taking? Authorizing Provider  aspirin  EC 81 MG tablet Take 81 mg by mouth daily. Swallow whole.   Yes [provider]  atorvastatin  (LIPITOR) 40 MG tablet Take 40 mg by mouth at bedtime.   Yes [provider]  clopidogrel  (PLAVIX )  75 MG tablet Take 75 mg by mouth daily.   Yes [provider]  diphenhydramine-acetaminophen  (TYLENOL  PM) 25-500 MG TABS tablet Take 1 tablet by mouth at bedtime.   Yes [provider]  insulin  degludec (TRESIBA  FLEXTOUCH) 200 UNIT/ML FlexTouch Pen Inject 18 Units into the skin at bedtime.   Yes [provider]  mupirocin ointment (BACTROBAN) 2 % Apply 1 Application topically daily. Right big toe   Yes [provider]    Physical Exam: Vitals:   11/06/23 1811 11/06/23 1815 11/06/23 1830 11/06/23 1845  BP: (!) 156/75 (!) 146/60  134/65 (!) 141/61  Pulse: (!) 106 96 (!) 102 (!) 104  Resp: 20 20 19  (!) 21  Temp:      TempSrc:      SpO2: 95% 95% 98% 96%  Weight:      Height:       General: Drowsy appearing obese elderly woman laying in bed. No acute distress. HEENT: [employer]/AT. Anicteric sclera. EOMI. PERRLA. Dry mucous membrane. CV: Tachycardic. Regular rhythm. No murmurs, rubs, or gallops. No LE edema Pulmonary: Lungs CTAB. Normal effort. No wheezing or rales. Decreased air movement throughout. Abdominal: Soft, nontender, nondistended. Normal bowel sounds. Extremities: Palpable radial and DP pulses. Normal ROM. Skin: Warm and dry. No obvious rash or lesions. Neuro: Drowsy. Oriented to self, person and place but not to time. Follows commands. Moves all extremities. Strength 4/5 in RUE and RLE, 3/5 in LUE and LLE. Normal sensation to light touch. Psych: Normal mood and affect  Data Reviewed: Labs show sodium 134, K+ 3.5, glucose 279, creatinine 0.93, WBC 4.7, Hgb 7.3 (11.9 7 years ago), platelet 228, PT/INR 13.9/1.1, lactic acid 2.0->1.8, negative fecal occult test, negative flu, RSV and COVID test, UA shows significant glucosuria and moderate hemoglobinuria but no signs of infection.  Chest x-ray with no active disease Phos 2.1, mag 1.5, TSH 2.83  CT ABDOMEN PELVIS W CONTRAST Result Date: 11/06/2023  IMPRESSION: 1. No acute or active process within the abdomen or pelvis. 2. Evidence of prior hysterectomy and prior appendectomy. 3. Aortic atherosclerosis. Aortic Atherosclerosis (ICD10-I70.0). Electronically Signed   By: Suzen Dials M.D.   On: 11/06/2023 20:27   CT HEAD WO CONTRAST ( ) Result Date: 11/06/2023  IMPRESSION: 1. No acute intracranial abnormality. 2. Generalized cerebral atrophy with chronic white matter small vessel ischemic changes. Electronically Signed   By: Suzen Dials M.D.   On: 11/06/2023 20:10   Assessment and Plan:  Marisa Davenport is a 83 y.o. female with medical history significant of  non-Hodgkin's lymphoma in remission, T2DM, HTN, HLD, and CVA with residual left-sided weakness who presents from her assisted living facility for evaluation of fevers and generalized weakness and admitted for severe sepsis  # Severe sepsis of unknown cause Obese patient with history of left hypogastric for more currently in remission presented from assisted living facility for evaluation of generalized weakness, change in mental status and fevers. She meets severe sepsis criteria with fever, tachypnea, tachycardia, lactic acidosis and change in mental status. CXR with no active disease. CT head negative. CT A/P with no acute abdominal pathology. Flu, COVID and RSV testing negative. UA with no signs of infection. No evidence of cellulitis on skin exam. Patient has no leukocytosis but remains febrile with recent temp of 101.1. The source of her sepsis is unknown at this point. -Admit to progressive bed -Continue IV vancomycin , cefepime  and Flagyl  -Continue IV LR 150 cc/h -Follow-up full respiratory panel -Follow-up vitamin B12 and vitamin D   levels -Trend CBC, fever curve -Would likely need LP if she continues to be febrile  # Acute metabolic encephalopathy Patient found to have slight change in mental status over the last few days. She is drowsy on exam, oriented to self, person and place but not to time. Able to follow commands appropriately. Change in mental status likely secondary to her sepsis of unknown etiology. Her electrolyte derangements likely contributing as well. -Manage sepsis as above -Trend and replete electrolytes as below -Delirium precautions  # Microcytic anemia Patient found to have hemoglobin of 7.3 with MCV of 62.5 and RDW of 20.8 on admission labs likely indicating iron  deficiency anemia. She has no evidence of active bleeding. Fecal acute test negative. Patient's generalized weakness likely secondary to her iron  deficiency. Repeat H&H shows slight improvement in Hgb to  7.6. -Trend CBC, transfuse for hgb >7 -Follow-up iron  studies, ferritin and vitamin B12  # T2DM Last A1c on file 8.6% 7 years ago. Blood glucose of 279 on CMP. Patient on Tresiba  18 units at bedtime. -Semglee  18 units at bedtime -Every 4 hours SSI with CBG checks -Follow-up repeat A1c  # Hypomagnesemia Mag low at 1.5 -Replete with IV mag 2 g -Follow-up repeat mag in the morning  # Hypophosphatemia Phosphorus low at 2.1 -Replete with IV potassium phosphate  -Follow-up repeat phosphorus in the morning  # HLD # Hx of CVA Per sister, patient had a mild stroke early last year and she has some residual left-sided weakness. She is currently ambulates with a wheelchair -Continue aspirin , Plavix  and atorvastatin  -Follow-up repeat lipid panel  # Generalized weakness Likely combination of her sepsis, anemia and electrolyte derangements. -PT/OT eval and treat -Fall precautions   Advance Care Planning:   Code Status: Limited: Do not attempt resuscitation (DNR) -DNR-LIMITED -Do Not Intubate/DNI    Consults: None  Family Communication: Discussed admission with sister at bedside  Severity of Illness: The appropriate patient status for this patient is INPATIENT. Inpatient status is judged to be reasonable and necessary in order to provide the required intensity of service to ensure the patient's safety. The patient's presenting symptoms, physical exam findings, and initial radiographic and laboratory data in the context of their chronic comorbidities is felt to place them at high risk for further clinical deterioration. Furthermore, it is not anticipated that the patient will be medically stable for discharge from the hospital within 2 midnights of admission.   * I certify that at the point of admission it is my clinical judgment that the patient will require inpatient hospital care spanning beyond 2 midnights from the point of admission due to high intensity of service, high risk for further  deterioration and high frequency of surveillance required.*  Author: Claretta CHRISTELLA Alderman, MD 11/06/2023 8:15 PM  For on call review [email or web address].

## 2023-11-06 NOTE — ED Provider Notes (Signed)
 [place] EMERGENCY DEPARTMENT AT [facility] Provider Note   CSN: [id number] Arrival date & time: 11/06/23  1401     History  Chief Complaint  Patient presents with   Altered Mental Status   Weakness    Marisa Davenport is a 83 y.o. female.  HPI Presents via EMS with concern for sluggishness, fever.  Patient is ANO x 3, reportedly, at baseline.  Over the past 2 days nursing home staff is noticed decreased interactivity, fever.  EMS reports the patient was febrile, tachycardic en route.  Patient herself answers questions a very long delay, but does answer them appropriately.  She denies pain, denies dyspnea, acknowledges feeling sick. No reported other changes from nursing home staff.    Home Medications Prior to Admission medications   Medication Sig Start Date End Date Taking? Authorizing Provider  aspirin  EC 81 MG tablet Take 81 mg by mouth daily. Swallow whole.   Yes [provider]  atorvastatin  (LIPITOR) 40 MG tablet Take 40 mg by mouth at bedtime.   Yes [provider]  clopidogrel  (PLAVIX ) 75 MG tablet Take 75 mg by mouth daily.   Yes [provider]  diphenhydramine-acetaminophen  (TYLENOL  PM) 25-500 MG TABS tablet Take 1 tablet by mouth at bedtime.   Yes [provider]  insulin  degludec (TRESIBA  FLEXTOUCH) 200 UNIT/ML FlexTouch Pen Inject 18 Units into the skin at bedtime.   Yes [provider]  mupirocin ointment (BACTROBAN) 2 % Apply 1 Application topically daily. Right big toe   Yes [provider]      Allergies    Penicillins    Review of Systems   Review of Systems  Physical Exam Updated Vital Signs BP (!) 141/61   Pulse (!) 104   Temp (!) 100.8 F (38.2 C) (Oral)   Resp (!) 21   Ht 5' 3 (1.6 m)   Wt 92.2 kg   SpO2 96%   BMI 36.01 kg/m  Physical Exam Vitals and nursing note reviewed.  Constitutional:      Appearance: She is well-developed. She is obese. She is ill-appearing.  HENT:      Head: Normocephalic and atraumatic.  Eyes:     Conjunctiva/sclera: Conjunctivae normal.  Cardiovascular:     Rate and Rhythm: Regular rhythm. Tachycardia present.  Pulmonary:     Effort: Pulmonary effort is normal. No respiratory distress.     Breath sounds: Normal breath sounds. No stridor.  Abdominal:     General: There is no distension.     Tenderness: There is no abdominal tenderness. There is no guarding.  Skin:    General: Skin is warm and dry.       Neurological:     Cranial Nerves: No cranial nerve deficit.     Motor: Weakness and atrophy present.  Psychiatric:        Behavior: Behavior is slowed and withdrawn.     ED Results / Procedures / Treatments   Labs (all labs ordered are listed, but only abnormal results are displayed) Labs Reviewed  COMPREHENSIVE METABOLIC PANEL - Abnormal; Notable for the following components:      Result Value   Sodium 134 (*)    Glucose, Bld 279 (*)    Calcium  8.2 (*)    All other components within normal limits  CBC WITH DIFFERENTIAL/PLATELET - Abnormal; Notable for the following components:   Hemoglobin 7.3 (*)    HCT 26.2 (*)    MCV 62.5 (*)    MCH 17.4 (*)  MCHC 27.9 (*)    RDW 20.8 (*)    nRBC 0.4 (*)    Lymphs Abs 0.6 (*)    All other components within normal limits  URINALYSIS, W/ REFLEX TO CULTURE (INFECTION SUSPECTED) - Abnormal; Notable for the following components:   Color, Urine STRAW (*)    Glucose, UA >=500 (*)    Hgb urine dipstick MODERATE (*)    All other components within normal limits  I-STAT CG4 LACTIC ACID, ED - Abnormal; Notable for the following components:   Lactic Acid, Venous 2.0 (*)    All other components within normal limits  RESP PANEL BY RT-PCR (RSV, FLU A&B, COVID)  [id number]  CULTURE, BLOOD (ROUTINE X 2)  CULTURE, BLOOD (ROUTINE X 2)  PROTIME-INR  APTT  POC OCCULT BLOOD, ED  I-STAT CG4 LACTIC ACID, ED    EKG EKG Interpretation Date/Time:  Sunday November 06 2023 14:13:10  EST Ventricular Rate:  113 PR Interval:  172 QRS Duration:  84 QT Interval:  340 QTC Calculation: 467 R Axis:   259  Text Interpretation: Sinus tachycardia Probable right ventricular hypertrophy Inferior infarct, old Confirmed by Garrick Charleston [phone]) on 11/06/2023 2:17:59 PM  Radiology DG Chest Port 1 View Result Date: 11/06/2023 CLINICAL DATA:  Questionable sepsis - evaluate for abnormality Weakness and fever. EXAM: PORTABLE CHEST 1 VIEW COMPARISON:  04/14/2016 FINDINGS: Right chest port in place. Normal heart size.The cardiomediastinal contours are normal. Aortic atherosclerosis. Pulmonary vasculature is normal. No consolidation, pleural effusion, or pneumothorax. No acute osseous abnormalities are seen. IMPRESSION: No active disease. Electronically Signed   By: Andrea Gasman M.D.   On: 11/06/2023 15:26    Procedures Procedures    Medications Ordered in ED Medications  lactated ringers  infusion (has no administration in time range)  lactated ringers  bolus 1,000 mL (0 mLs Intravenous Stopped 11/06/23 1845)    And  lactated ringers  bolus 1,000 mL (0 mLs Intravenous Stopped 11/06/23 1845)    And  lactated ringers  bolus 1,000 mL (has no administration in time range)  acetaminophen  (TYLENOL ) tablet 1,000 mg (1,000 mg Oral Given 11/06/23 1545)  sodium chloride  0.9 % bolus 1,000 mL (0 mLs Intravenous Stopped 11/06/23 1845)  metroNIDAZOLE  (FLAGYL ) IVPB 500 mg (0 mg Intravenous Stopped 11/06/23 1653)  vancomycin  (VANCOCIN ) IVPB 1000 mg/200 mL premix (1,000 mg Intravenous New Bag/Given 11/06/23 1801)  ceFEPIme  (MAXIPIME ) 2 g in sodium chloride  0.9 % 100 mL IVPB (0 g Intravenous Stopped 11/06/23 1700)    ED Course/ Medical Decision Making/ A&P                                 Medical Decision Making Elderly female with multiple medical issues including obesity, weakness, diabetes presents with decreased interactivity, fever, tachycardia, concern for bacteremia, sepsis versus infection  versus electrolyte abnormalities/dehydration.  Patient is oriented appropriately, though with substantial delay in answering questions.  Cardiac 115 sinus tach abnormal Pulse ox 96% on room air normal   Amount and/or Complexity of Data Reviewed Independent Historian: EMS    Details: As above External Data Reviewed: notes. Labs: ordered. Decision-making details documented in ED Course. Radiology: ordered and independent interpretation performed. Decision-making details documented in ED Course. ECG/medicine tests: ordered and independent interpretation performed. Decision-making details documented in ED Course.  Risk OTC drugs. Prescription drug management. Decision regarding hospitalization. Diagnosis or treatment significantly limited by social determinants of health.  Update: Patient accompanied by his sister.  We discussed  presentation, patient's history, she notes that she has had cellulitis with sepsis in the past.   7:05 PM Lactic acid now 1.8  With fluid resuscitation heart rate now less than 100, fever has diminished, and patient is awake, alert, interactive.  With protected airway, improved resuscitation following initial presentation, but with persisting concern for severe sepsis, possibly from cellulitis patient will be admitted for further monitoring, management.  CRITICAL CARE Performed by: Lamar Salen Total critical care time: 35 minutes Critical care time was exclusive of separately billable procedures and treating other patients. Critical care was necessary to treat or prevent imminent or life-threatening deterioration. Critical care was time spent personally by me on the following activities: development of treatment plan with patient and/or surrogate as well as nursing, discussions with consultants, evaluation of patient's response to treatment, examination of patient, obtaining history from patient or surrogate, ordering and performing treatments and  interventions, ordering and review of laboratory studies, ordering and review of radiographic studies, pulse oximetry and re-evaluation of patient's condition.   Final Clinical Impression(s) / ED Diagnoses Final diagnoses:  Severe sepsis [facility])     Salen Lamar, MD 11/06/23 1907

## 2023-11-06 NOTE — Progress Notes (Signed)
 Pharmacy Antibiotic Note  Marisa Davenport is a 83 y.o. female admitted on 11/06/2023 with  medical history significant of non-Hodgkin's lymphoma in remission, T2DM, HTN, HLD, and CVA with residual left-sided weakness who presents from her assisted living facility for evaluation of fevers and generalized weakness. .  Pharmacy has been consulted to dose vancomycin  and cefepime  for sepsis.  Plan: Vancomycin  1250mg  IV q36h (AUC 481.7, Scr 0.9.3) Cefepime  2gm IV q12h Follow renal function, cultures and clinical course  Height: 5' 3 (160 cm) Weight: 92.2 kg (203 lb 4.2 oz) IBW/kg (Calculated) : 52.4  Temp (24hrs), Avg:101.4 F (38.6 C), Min:100.8 F (38.2 C), Max:102.3 F (39.1 C)  Recent Labs  Lab 11/06/23 1434 11/06/23 1443 11/06/23 1851  WBC 4.7  --   --   CREATININE 0.93  --   --   LATICACIDVEN  --  2.0* 1.8    Estimated Creatinine Clearance: 52 mL/min (by C-G formula based on SCr of 0.93 mg/dL).    Allergies  Allergen Reactions   Penicillins Other (See Comments)    Hives    Antimicrobials this admission: 1/12 vanc >> 1/12 cefepime  >> 1/12 flagyl  >>  Dose adjustments this admission:   Microbiology results: 1/12 BCx:  1/12 resp PCR:  Thank you for allowing pharmacy to be a part of this patient's care. Leeroy Mace RPh 11/06/2023, 11:54 PM

## 2023-11-06 NOTE — ED Notes (Signed)
 ED TO INPATIENT HANDOFF REPORT  ED Nurse Name and Phone #: Naydeline Morace/[phone]  S Name/Age/Gender Marisa Davenport 83 y.o. female Room/Bed: WA15/WA15  Code Status   Code Status: Limited: Do not attempt resuscitation (DNR) -DNR-LIMITED -Do Not Intubate/DNI   Home/SNF/Other Skilled nursing facility Patient oriented to: self,DOB, and place Is this baseline? Yes   Triage Complete: Triage complete  Chief Complaint Sepsis [facility]) [A41.9]  Triage Note Pt arrived with EMS reporting fever and weakness. Sent by facility. Denies any other concerns   Allergies Allergies  Allergen Reactions   Penicillins Other (See Comments)    Hives    Level of Care/Admitting Diagnosis ED Disposition     ED Disposition  Admit   Condition  --   Comment  Hospital Area: [facility] [[id number]]  Level of Care: Progressive [102]  Admit to Progressive based on following criteria: MULTISYSTEM THREATS such as stable sepsis, metabolic/electrolyte imbalance with or without encephalopathy that is responding to early treatment.  May admit patient to [facility] or [facility] if equivalent level of care is available:: No  Covid Evaluation: Asymptomatic - no recent exposure (last 10 days) testing not required  Diagnosis: Sepsis [facility]) [[id number]]  Admitting Physician: LOU CLARETTA HERO [[id number]]  Attending Physician: LOU CLARETTA HERO [[id number]]  Certification:: I certify this patient will need inpatient services for at least 2 midnights  Expected Medical Readiness: 11/08/2023          B Medical/Surgery History Past Medical History:  Diagnosis Date   Allergy    Bilateral leg edema 03/2016   Bursitis left hip   Cat scratch of left lower leg 03/2016   Cellulitis 03/2016   LEFT LOWER LEG   Diabetes mellitus, type II (HCC)    Elevated BP    Hyperlipidemia    Non Hodgkin's lymphoma (HCC) 1992    resolved    Past Surgical History:  Procedure Laterality Date   ABDOMINAL HYSTERECTOMY      APPENDECTOMY     BILATERAL OOPHORECTOMY     colonscopy  01-13-09   per Dr. Jakie, diverticulosis only, repeat in 10 years    DIAGNOSTIC MAMMOGRAM  09/05/09   removal of basal cell carcinoma from left upper arm  2009   per Dr. Ivin FRIED CUFF REPAIR Right 2010 ?      A IV Location/Drains/Wounds Patient Lines/Drains/Airways Status     Active Line/Drains/Airways     Name Placement date Placement time Site Days   Peripheral IV 11/06/23 18 G Anterior;Proximal;Right Forearm 11/06/23  1500  Forearm  less than 1   Peripheral IV 11/06/23 20 G 1.25 Anterior;Right Forearm 11/06/23  1719  Forearm  less than 1   Wound / Incision (Open or Dehisced) 04/14/16 Other (Comment) Leg Left 04/14/16  --  Leg  2762            Intake/Output Last 24 hours  Intake/Output Summary (Last 24 hours) at 11/06/2023 2016 Last data filed at 11/06/2023 1910 Gross per 24 hour  Intake 3400 ml  Output --  Net 3400 ml    Labs/Imaging Results for orders placed or performed during the hospital encounter of 11/06/23 (from the past 48 hours)  Comprehensive metabolic panel     Status: Abnormal   Collection Time: 11/06/23  2:34 PM  Result Value Ref Range   Sodium 134 (L) 135 - 145 mmol/L   Potassium 3.5 3.5 - 5.1 mmol/L   Chloride 102 98 - 111 mmol/L   CO2 22 22 -  32 mmol/L   Glucose, Bld 279 (H) 70 - 99 mg/dL    Comment: Glucose reference range applies only to samples taken after fasting for at least 8 hours.   BUN 14 8 - 23 mg/dL   Creatinine, Ser 9.06 0.44 - 1.00 mg/dL   Calcium  8.2 (L) 8.9 - 10.3 mg/dL   Total Protein 7.1 6.5 - 8.1 g/dL   Albumin 3.5 3.5 - 5.0 g/dL   AST 21 15 - 41 U/L   ALT 16 0 - 44 U/L   Alkaline Phosphatase 102 38 - 126 U/L   Total Bilirubin 0.7 0.0 - 1.2 mg/dL   GFR, Estimated >39 >39 mL/min    Comment: (NOTE) Calculated using the CKD-EPI Creatinine Equation (2021)    Anion gap 10 5 - 15    Comment: Performed at [facility], [address],  [place], [place] [address]  CBC with Differential     Status: Abnormal   Collection Time: 11/06/23  2:34 PM  Result Value Ref Range   WBC 4.7 4.0 - 10.5 K/uL   RBC 4.19 3.87 - 5.11 MIL/uL   Hemoglobin 7.3 (L) 12.0 - 15.0 g/dL    Comment: Reticulocyte Hemoglobin testing may be clinically indicated, consider ordering this additional test [id number]    HCT 26.2 (L) 36.0 - 46.0 %   MCV 62.5 (L) 80.0 - 100.0 fL   MCH 17.4 (L) 26.0 - 34.0 pg   MCHC 27.9 (L) 30.0 - 36.0 g/dL   RDW 79.1 (H) 88.4 - 84.4 %   Platelets 228 150 - 400 K/uL   nRBC 0.4 (H) 0.0 - 0.2 %   Neutrophils Relative % 75 %   Neutro Abs 3.5 1.7 - 7.7 K/uL   Lymphocytes Relative 13 %   Lymphs Abs 0.6 (L) 0.7 - 4.0 K/uL   Monocytes Relative 11 %   Monocytes Absolute 0.5 0.1 - 1.0 K/uL   Eosinophils Relative 0 %   Eosinophils Absolute 0.0 0.0 - 0.5 K/uL   Basophils Relative 0 %   Basophils Absolute 0.0 0.0 - 0.1 K/uL   Immature Granulocytes 1 %   Abs Immature Granulocytes 0.03 0.00 - 0.07 K/uL    Comment: Performed at [facility], [address], [place], [place] [address]  Protime-INR     Status: None   Collection Time: 11/06/23  2:34 PM  Result Value Ref Range   Prothrombin Time 13.9 11.4 - 15.2 seconds   INR 1.1 0.8 - 1.2    Comment: (NOTE) INR goal varies based on device and disease states. Performed at [facility], [address] [place], [place] [address]   APTT     Status: None   Collection Time: 11/06/23  2:34 PM  Result Value Ref Range   aPTT 35 24 - 36 seconds    Comment: Performed at [facility], [address] [place], [place] [address]  I-Stat Lactic Acid, ED     Status: Abnormal   Collection Time: 11/06/23  2:43 PM  Result Value Ref Range   Lactic Acid, Venous 2.0 (HH) 0.5 - 1.9 mmol/L  Urinalysis, w/ Reflex to Culture (Infection Suspected) -     Status: Abnormal   Collection Time: 11/06/23  3:07 PM  Result Value Ref Range   Specimen Source  URINE, CLEAN CATCH    Color, Urine STRAW (A) YELLOW   APPearance CLEAR CLEAR   Specific Gravity, Urine 1.009 1.005 - 1.030   pH 5.0  5.0 - 8.0   Glucose, UA >=500 (A) NEGATIVE mg/dL   Hgb urine dipstick MODERATE (A) NEGATIVE   Bilirubin Urine NEGATIVE NEGATIVE   Ketones, ur NEGATIVE NEGATIVE mg/dL   Protein, ur NEGATIVE NEGATIVE mg/dL   Nitrite NEGATIVE NEGATIVE   Leukocytes,Ua NEGATIVE NEGATIVE   RBC / HPF 0-5 0 - 5 RBC/hpf   WBC, UA 0-5 0 - 5 WBC/hpf    Comment:        Reflex urine culture not performed if WBC <=10, OR if Squamous epithelial cells >5. If Squamous epithelial cells >5 suggest recollection.    Bacteria, UA NONE SEEN NONE SEEN   Squamous Epithelial / HPF 0-5 0 - 5 /HPF   Mucus PRESENT     Comment: Performed at [facility], [address] [place], [place] [address]  Resp panel by RT-PCR (RSV, Flu A&B, Covid) Anterior Nasal Swab     Status: None   Collection Time: 11/06/23  3:53 PM   Specimen: Anterior Nasal Swab  Result Value Ref Range   SARS Coronavirus 2 by RT PCR NEGATIVE NEGATIVE    Comment: (NOTE) SARS-CoV-2 target nucleic acids are NOT DETECTED.  The SARS-CoV-2 RNA is generally detectable in upper respiratory specimens during the acute phase of infection. The lowest concentration of SARS-CoV-2 viral copies this assay can detect is 138 copies/mL. A negative result does not preclude SARS-Cov-2 infection and should not be used as the sole basis for treatment or other patient management decisions. A negative result may occur with  improper specimen collection/handling, submission of specimen other than nasopharyngeal swab, presence of viral mutation(s) within the areas targeted by this assay, and inadequate number of viral copies(<138 copies/mL). A negative result must be combined with clinical observations, patient history, and epidemiological information. The expected result is Negative.  Fact Sheet for Patients:   bloggercourse.com  Fact Sheet for Healthcare Providers:  seriousbroker.it  This test is no t yet approved or cleared by the [place]  FDA and  has been authorized for detection and/or diagnosis of SARS-CoV-2 by FDA under an Emergency Use Authorization (EUA). This EUA will remain  in effect (meaning this test can be used) for the duration of the COVID-19 declaration under Section 564(b)(1) of the Act, 21 U.S.C.section 360bbb-3(b)(1), unless the authorization is terminated  or revoked sooner.       Influenza A by PCR NEGATIVE NEGATIVE   Influenza B by PCR NEGATIVE NEGATIVE    Comment: (NOTE) The Xpert Xpress SARS-CoV-2/FLU/RSV plus assay is intended as an aid in the diagnosis of influenza from Nasopharyngeal swab specimens and should not be used as a sole basis for treatment. Nasal washings and aspirates are unacceptable for Xpert Xpress SARS-CoV-2/FLU/RSV testing.  Fact Sheet for Patients: bloggercourse.com  Fact Sheet for Healthcare Providers: seriousbroker.it  This test is not yet approved or cleared by the [place]  FDA and has been authorized for detection and/or diagnosis of SARS-CoV-2 by FDA under an Emergency Use Authorization (EUA). This EUA will remain in effect (meaning this test can be used) for the duration of the COVID-19 declaration under Section 564(b)(1) of the Act, 21 U.S.C. section 360bbb-3(b)(1), unless the authorization is terminated or revoked.     Resp Syncytial Virus by PCR NEGATIVE NEGATIVE    Comment: (NOTE) Fact Sheet for Patients: bloggercourse.com  Fact Sheet for Healthcare Providers: seriousbroker.it  This test is not yet approved or cleared by the [place]  FDA and has been authorized for detection and/or diagnosis of SARS-CoV-2 by FDA  under an Emergency Use Authorization (EUA).  This EUA will remain in effect (meaning this test can be used) for the duration of the COVID-19 declaration under Section 564(b)(1) of the Act, 21 U.S.C. section 360bbb-3(b)(1), unless the authorization is terminated or revoked.  Performed at [facility], [address] [place], [place] [address]   POC occult blood, ED     Status: None   Collection Time: 11/06/23  4:25 PM  Result Value Ref Range   Fecal Occult Bld NEGATIVE NEGATIVE  I-Stat Lactic Acid, ED     Status: None   Collection Time: 11/06/23  6:51 PM  Result Value Ref Range   Lactic Acid, Venous 1.8 0.5 - 1.9 mmol/L   CT HEAD WO CONTRAST ( ) Result Date: 11/06/2023 CLINICAL DATA:  Fever and weakness. EXAM: CT HEAD WITHOUT CONTRAST TECHNIQUE: Contiguous axial images were obtained from the base of the skull through the vertex without intravenous contrast. RADIATION DOSE REDUCTION: This exam was performed according to the departmental dose-optimization program which includes automated exposure control, adjustment of the mA and/or kV according to patient size and/or use of iterative reconstruction technique. COMPARISON:  April 14, 2016 FINDINGS: Brain: There is moderate severity cerebral atrophy with widening of the extra-axial spaces and ventricular dilatation. There are areas of decreased attenuation within the white matter tracts of the supratentorial brain, consistent with microvascular disease changes. Vascular: Mild to moderate severity bilateral cavernous carotid artery calcification is noted. Skull: Negative for acute fracture or focal lesion. Sinuses/Orbits: No acute finding. Other: None. IMPRESSION: 1. No acute intracranial abnormality. 2. Generalized cerebral atrophy with chronic white matter small vessel ischemic changes. Electronically Signed   By: Suzen Dials M.D.   On: 11/06/2023 20:10   DG Chest Port 1 View Result Date: 11/06/2023 CLINICAL DATA:  Questionable sepsis - evaluate for abnormality  Weakness and fever. EXAM: PORTABLE CHEST 1 VIEW COMPARISON:  04/14/2016 FINDINGS: Right chest port in place. Normal heart size.The cardiomediastinal contours are normal. Aortic atherosclerosis. Pulmonary vasculature is normal. No consolidation, pleural effusion, or pneumothorax. No acute osseous abnormalities are seen. IMPRESSION: No active disease. Electronically Signed   By: Andrea Gasman M.D.   On: 11/06/2023 15:26    Pending Labs Unresulted Labs (From admission, onward)     Start     Ordered   11/06/23 1936  VITAMIN D  25 Hydroxy (Vit-D Deficiency, Fractures)  Once,   R        11/06/23 1936   11/06/23 1936  Respiratory (~20 pathogens) panel by PCR  (Respiratory panel by PCR (~20 pathogens, ~24 hr TAT)  w precautions)  Once,   R        11/06/23 1936   11/06/23 1936  Phosphorus  Once,   R        11/06/23 1936   11/06/23 1935  Iron  and TIBC  Once,   R        11/06/23 1936   11/06/23 1935  Ferritin  Once,   R        11/06/23 1936   11/06/23 1935  TSH  Once,   R        11/06/23 1936   11/06/23 1935  Magnesium   Once,   R        11/06/23 1936   11/06/23 1935  Vitamin B12  Once,   R        11/06/23 1936   11/06/23 1414  Blood Culture (routine x 2)  (Undifferentiated presentation (screening labs and basic  nursing orders))  BLOOD CULTURE X 2,   STAT      11/06/23 1413            Vitals/Pain Today's Vitals   11/06/23 1811 11/06/23 1815 11/06/23 1830 11/06/23 1845  BP: (!) 156/75 (!) 146/60 134/65 (!) 141/61  Pulse: (!) 106 96 (!) 102 (!) 104  Resp: 20 20 19  (!) 21  Temp:      TempSrc:      SpO2: 95% 95% 98% 96%  Weight:      Height:      PainSc:        Isolation Precautions Droplet precaution  Medications Medications  lactated ringers  infusion (has no administration in time range)  lactated ringers  bolus 1,000 mL (0 mLs Intravenous Stopped 11/06/23 1845)    And  lactated ringers  bolus 1,000 mL (0 mLs Intravenous Stopped 11/06/23 1845)    And  lactated ringers  bolus  1,000 mL (has no administration in time range)  acetaminophen  (TYLENOL ) tablet 650 mg (has no administration in time range)    Or  acetaminophen  (TYLENOL ) suppository 650 mg (has no administration in time range)  senna-docusate (Senokot-S) tablet 1 tablet (has no administration in time range)  ondansetron  (ZOFRAN ) tablet 4 mg (has no administration in time range)    Or  ondansetron  (ZOFRAN ) injection 4 mg (has no administration in time range)  acetaminophen  (TYLENOL ) tablet 1,000 mg (1,000 mg Oral Given 11/06/23 1545)  sodium chloride  0.9 % bolus 1,000 mL (0 mLs Intravenous Stopped 11/06/23 1845)  metroNIDAZOLE  (FLAGYL ) IVPB 500 mg (0 mg Intravenous Stopped 11/06/23 1653)  vancomycin  (VANCOCIN ) IVPB 1000 mg/200 mL premix (0 mg Intravenous Stopped 11/06/23 1910)  ceFEPIme  (MAXIPIME ) 2 g in sodium chloride  0.9 % 100 mL IVPB (0 g Intravenous Stopped 11/06/23 1700)  iohexol  (OMNIPAQUE ) 300 MG/ML solution 100 mL (100 mLs Intravenous Contrast Given 11/06/23 1947)    Mobility walks with walker and x2 assist d/t generalized weakness       R Report given to:

## 2023-11-07 DIAGNOSIS — G9341 Metabolic encephalopathy: Secondary | ICD-10-CM | POA: Diagnosis not present

## 2023-11-07 DIAGNOSIS — A419 Sepsis, unspecified organism: Secondary | ICD-10-CM | POA: Diagnosis not present

## 2023-11-07 DIAGNOSIS — J101 Influenza due to other identified influenza virus with other respiratory manifestations: Secondary | ICD-10-CM

## 2023-11-07 LAB — BASIC METABOLIC PANEL
Anion gap: 9 (ref 5–15)
BUN: 10 mg/dL (ref 8–23)
CO2: 22 mmol/L (ref 22–32)
Calcium: 8 mg/dL — ABNORMAL LOW (ref 8.9–10.3)
Chloride: 105 mmol/L (ref 98–111)
Creatinine, Ser: 0.69 mg/dL (ref 0.44–1.00)
GFR, Estimated: >60 mL/min (ref >60)
Glucose, Bld: 214 mg/dL — ABNORMAL HIGH (ref 70–99)
Potassium: 3.4 mmol/L — ABNORMAL LOW (ref 3.5–5.1)
Sodium: 136 mmol/L (ref 135–145)

## 2023-11-07 LAB — CBC
HCT: 24.6 % — ABNORMAL LOW (ref 36.0–46.0)
Hemoglobin: 6.8 g/dL — CL (ref 12.0–15.0)
MCH: 17.1 pg — ABNORMAL LOW (ref 26.0–34.0)
MCHC: 27.6 g/dL — ABNORMAL LOW (ref 30.0–36.0)
MCV: 62 fL — ABNORMAL LOW (ref 80.0–100.0)
Platelets: 196 10*3/uL (ref 150–400)
RBC: 3.97 MIL/uL (ref 3.87–5.11)
RDW: 20.5 % — ABNORMAL HIGH (ref 11.5–15.5)
WBC: 3 10*3/uL — ABNORMAL LOW (ref 4.0–10.5)
nRBC: 0 % (ref 0.0–0.2)

## 2023-11-07 LAB — LIPID PANEL
Cholesterol: 75 mg/dL (ref 0–200)
HDL: 36 mg/dL — ABNORMAL LOW (ref >40)
LDL Cholesterol: 19 mg/dL (ref 0–99)
Total CHOL/HDL Ratio: 2.1 {ratio}
Triglycerides: 98 mg/dL (ref <150)
VLDL: 20 mg/dL (ref 0–40)

## 2023-11-07 LAB — RESPIRATORY PANEL BY PCR

## 2023-11-07 LAB — GLUCOSE, CAPILLARY
Glucose-Capillary: 207 mg/dL — ABNORMAL HIGH (ref 70–99)
Glucose-Capillary: 149 mg/dL — ABNORMAL HIGH (ref 70–99)
Glucose-Capillary: 140 mg/dL — ABNORMAL HIGH (ref 70–99)
Glucose-Capillary: 162 mg/dL — ABNORMAL HIGH (ref 70–99)
Glucose-Capillary: 206 mg/dL — ABNORMAL HIGH (ref 70–99)

## 2023-11-07 LAB — PHOSPHORUS: Phosphorus: 3.7 mg/dL (ref 2.5–4.6)

## 2023-11-07 LAB — MAGNESIUM: Magnesium: 1.8 mg/dL (ref 1.7–2.4)

## 2023-11-07 LAB — PROCALCITONIN: Procalcitonin: <0.1 ng/mL

## 2023-11-07 LAB — PREPARE RBC (CROSSMATCH)

## 2023-11-07 LAB — HEMOGLOBIN A1C
Hgb A1c MFr Bld: 10.4 % — ABNORMAL HIGH (ref 4.8–5.6)
Mean Plasma Glucose: 251.78 mg/dL

## 2023-11-07 LAB — ABO/RH: ABO/RH(D): A NEG

## 2023-11-07 MED ORDER — PNEUMOCOCCAL 20-VAL CONJ VACC 0.5 ML IM SUSY
0.5000 mL | PREFILLED_SYRINGE | INTRAMUSCULAR | Status: DC
  Filled 2023-11-07: qty 0.5

## 2023-11-07 MED ORDER — LABETALOL HCL 5 MG/ML IV SOLN: 10.0000 mg | INTRAVENOUS | Status: DC | PRN

## 2023-11-07 MED ORDER — SODIUM CHLORIDE 0.9% IV SOLUTION: Freq: Once | INTRAVENOUS | Status: AC

## 2023-11-07 MED ORDER — IRON SUCROSE 300 MG IVPB - SIMPLE MED
300.0000 mg | Freq: Once | Status: AC
  Administered 2023-11-07: 300 mg via INTRAVENOUS
  Filled 2023-11-07: qty 300

## 2023-11-07 MED ORDER — INFLUENZA VAC A&B SURF ANT ADJ 0.5 ML IM SUSY
0.5000 mL | PREFILLED_SYRINGE | INTRAMUSCULAR | Status: DC
  Filled 2023-11-07: qty 0.5

## 2023-11-07 MED ORDER — AMLODIPINE BESYLATE 5 MG PO TABS
5.0000 mg | ORAL_TABLET | Freq: Every day | ORAL | Status: DC
  Filled 2023-11-07: qty 1

## 2023-11-07 MED ORDER — OSELTAMIVIR PHOSPHATE 30 MG PO CAPS
30.0000 mg | ORAL_CAPSULE | Freq: Two times a day (BID) | ORAL | Status: DC
  Administered 2023-11-07 – 2023-11-10 (×7): 30 mg via ORAL
  Filled 2023-11-07 (×7): qty 1

## 2023-11-07 MED ORDER — POTASSIUM CHLORIDE CRYS ER 20 MEQ PO TBCR
60.0000 meq | EXTENDED_RELEASE_TABLET | Freq: Once | ORAL | Status: AC
  Administered 2023-11-07: 60 meq via ORAL
  Filled 2023-11-07: qty 3

## 2023-11-07 NOTE — Progress Notes (Signed)
 PROGRESS NOTE  Marisa Davenport FMW:[id number] DOB: [birth year]   PCP: [facility]  Patient is from: ALF.  Wheelchair dependent for most part.  Uses rolling walker with assistance  DOA: 11/06/2023 LOS: 1  Chief complaints Chief Complaint  Patient presents with   Altered Mental Status   Weakness     Brief Narrative / Interim history: 83 year old F with PMH of non-Hodgkin's lymphoma in remission, CVA with left-sided weakness, DM-2, HTN, HLD CAD presenting with fever, altered mental status and general weakness, and admitted with concern for severe sepsis.  Poorly febrile to 103.5 at facility.  In ED, she was febrile to 102.3.  Was tachycardic and tachypneic but not hypotensive.  No leukocytosis.  Anemic to 7.3 (11.9 about 7 years ago).  Hemoccult negative.  Take acid 2.0.  COVID-19, influenza and RSV PCR nonreactive.  CXR without acute finding.  Patient was started on broad-spectrum antibiotics.  And admitted with working diagnosis of severe sepsis without clear source.    The next day, procalcitonin negative.  Blood cultures NGTD.  Twenty pathogen RVP positive for influenza A infection.  Started on Tamiflu .  Antibiotics discontinued.  Subjective: Seen and examined earlier this morning.  Spiked fever to 102.5 about 4 AM this morning.  She is somewhat sleepy but wakes to voice.  She is oriented x 4 except day and year.  Follows commands.  Has no complaints.  Objective: Vitals:   11/07/23 0348 11/07/23 0510 11/07/23 1037 11/07/23 1108  BP: (!) 167/70 (!) 142/63 120/69 (!) 119/52  Pulse: (!) 110 (!) 106 95 99  Resp: 18 16 20 19   Temp: (!) 102.5 F (39.2 C) 100 F (37.8 C) 99.1 F (37.3 C) 99.5 F (37.5 C)  TempSrc: Oral Oral Oral Oral  SpO2: 93% 96% 96% 100%  Weight: 87 kg     Height:        Examination:  GENERAL: No apparent distress.  Nontoxic. HEENT: MMM.  Vision and hearing grossly intact.  NECK: Supple.  No apparent JVD.  RESP:  No IWOB.  Fair aeration  bilaterally. CVS:  RRR. Heart sounds normal.  ABD/GI/GU: BS+. Abd soft, NTND.  MSK/EXT:  Moves extremities. No apparent deformity. No edema.  SKIN: no apparent skin lesion or wound NEURO: Sleepy but wakes to voice.  Oriented x 4 except date and year.  Follows commands.  No apparent focal neuro deficit. PSYCH: Calm. Normal affect.   Procedures:  None  Microbiology summarized: A 20 pathogen RVP positive for influenza A.   COVID-19, RSV P and influenza B PCR negative. Cultures NGTD  Assessment and plan: Severe sepsis due to influenza A infection: Surprisingly, no respiratory symptoms other than tachypnea.  She had SIRS with fever, tachypnea and tachycardia.  Has lactic acidosis and mental status change as evidence of endorgan damage.  CXR without acute finding.  CT abdomen and pelvis unrevealing.  No skin break or signs of cellulitis.  Culture data as above.  Procalcitonin negative.  No leukocytosis. -Discontinue broad-spectrum antibiotics -Start Tamiflu  30 mg twice daily for 5 days -Decrease IV fluid    Acute metabolic encephalopathy: Likely due to the above.  Has no focal neurodeficit.  CT head, TSH and B12 unrevealing.  Low suspicion for seizure.  Takes Tylenol  PM at home. -Treat treatable causes -Avoid sedating medications -Reorientation and delirium precaution -Fall and aspiration precaution -PT/OT   Microcytic iron  deficiency anemia: Hgb dropped to 6.8.  Unknown baseline.  Hemoccult negative.  No overt bleeding.  Drop in Hgb  likely dilutional.  Anemia panel with severe iron  deficiency. Recent Labs    11/06/23 1434 11/06/23 2222 11/07/23 0504  HGB 7.3* 7.6* 6.8*  -One unit of PRBC ordered overnight -IV Venofer  300 mg x 1 -Monitor H&H   IDDM-2 with hyperglycemia and hyperlipidemia: A1c 10.4% (no recent value for comparison).  Uses Tresiba  18 units daily at home. Recent Labs  Lab 11/06/23 2335 11/07/23 0359 11/07/23 0725 11/07/23 1138  GLUCAP 163* 207* 149* 140*   -Continue SSI-moderate -Continue Semglee  18 units at bedtime -Carb modified diet -Continue Lipitor  History of CVA with some residual left-sided weakness.  Was unable to appreciate but limited exam due to mental status.  CT head without acute finding. -Continue aspirin , Plavix  and atorvastatin  -Follow-up repeat lipid panel   Hypokalemia/hypomagnesemia/hypophosphatemia -Monitor replenish as appropriate   Generalized weakness: Multifactorial including sepsis, dehydration, anemia and electrolyte derangement -Treat treatable causes -PT/OT  Dehydration: In the setting of acute illness.  Improved. -Decrease LR from 150 cc to 100 cc an hour  Obesity Body mass index is 33.98 kg/m.          DVT prophylaxis:  SCDs Start: 11/06/23 1931  Code Status: DNR/DNI Family Communication: Updated patient's sister over the phone Level of care: Progressive Status is: Inpatient Remains inpatient appropriate because: Severe sepsis due to influenza A infection, dehydration and electrolyte derangements   Final disposition: Likely back to ALF Consultants:  None  55 minutes with more than 50% spent in reviewing records, counseling patient/family and coordinating care.   Sch Meds:  Scheduled Meds:  aspirin  EC  81 mg Oral Daily   atorvastatin   40 mg Oral QHS   clopidogrel   75 mg Oral Daily   insulin  aspart  0-15 Units Subcutaneous Q4H   insulin  glargine-yfgn  18 Units Subcutaneous QHS   oseltamivir   30 mg Oral BID   Continuous Infusions:  iron  sucrose     PRN Meds:.acetaminophen  **OR** acetaminophen , ondansetron  **OR** ondansetron  (ZOFRAN ) IV, senna-docusate  Antimicrobials: Anti-infectives (From admission, onward)    Start     Dose/Rate Route Frequency Ordered Stop   11/08/23 0600  vancomycin  (VANCOREADY) IVPB 1250 mg/250 mL  Status:  Discontinued        1,250 mg 166.7 mL/hr over 90 Minutes Intravenous Every 36 hours 11/06/23 2357 11/07/23 1116   11/07/23 1000  oseltamivir   (TAMIFLU ) capsule 30 mg        30 mg Oral 2 times daily 11/07/23 0836 11/12/23 0959   11/07/23 0400  metroNIDAZOLE  (FLAGYL ) IVPB 500 mg  Status:  Discontinued        500 mg 100 mL/hr over 60 Minutes Intravenous Every 12 hours 11/06/23 2250 11/07/23 1116   11/07/23 0400  ceFEPIme  (MAXIPIME ) 2 g in sodium chloride  0.9 % 100 mL IVPB  Status:  Discontinued        2 g 200 mL/hr over 30 Minutes Intravenous Every 12 hours 11/06/23 2301 11/07/23 1116   11/06/23 1630  ceFEPIme  (MAXIPIME ) 2 g in sodium chloride  0.9 % 100 mL IVPB        2 g 200 mL/hr over 30 Minutes Intravenous STAT 11/06/23 1619 11/06/23 1700   11/06/23 1545  aztreonam (AZACTAM) 2 g in sodium chloride  0.9 % 100 mL IVPB  Status:  Discontinued        2 g 200 mL/hr over 30 Minutes Intravenous  Once 11/06/23 1540 11/06/23 1619   11/06/23 1545  metroNIDAZOLE  (FLAGYL ) IVPB 500 mg        500 mg 100 mL/hr  over 60 Minutes Intravenous  Once 11/06/23 1540 11/06/23 1653   11/06/23 1545  vancomycin  (VANCOCIN ) IVPB 1000 mg/200 mL premix        1,000 mg 200 mL/hr over 60 Minutes Intravenous  Once 11/06/23 1540 11/06/23 1910        I have personally reviewed the following labs and images: CBC: Recent Labs  Lab 11/06/23 1434 11/06/23 2222 11/07/23 0504  WBC 4.7  --  3.0*  NEUTROABS 3.5  --   --   HGB 7.3* 7.6* 6.8*  HCT 26.2* 27.0* 24.6*  MCV 62.5*  --  62.0*  PLT 228  --  196   BMP &GFR Recent Labs  Lab 11/06/23 1434 11/06/23 2124 11/07/23 0504  NA 134*  --  136  K 3.5  --  3.4*  CL 102  --  105  CO2 22  --  22  GLUCOSE 279*  --  214*  BUN 14  --  10  CREATININE 0.93  --  0.69  CALCIUM  8.2*  --  8.0*  MG  --  1.5* 1.8  PHOS  --  2.1* 3.7   Estimated Creatinine Clearance: 58.6 mL/min (by C-G formula based on SCr of 0.69 mg/dL). Liver & Pancreas: Recent Labs  Lab 11/06/23 1434  AST 21  ALT 16  ALKPHOS 102  BILITOT 0.7  PROT 7.1  ALBUMIN 3.5   No results for input(s): LIPASE, AMYLASE in the last 168  hours. No results for input(s): AMMONIA in the last 168 hours. Diabetic: Recent Labs    11/07/23 0504  HGBA1C 10.4*   Recent Labs  Lab 11/06/23 2335 11/07/23 0359 11/07/23 0725 11/07/23 1138  GLUCAP 163* 207* 149* 140*   Cardiac Enzymes: No results for input(s): CKTOTAL, CKMB, CKMBINDEX, TROPONINI in the last 168 hours. No results for input(s): PROBNP in the last 8760 hours. Coagulation Profile: Recent Labs  Lab 11/06/23 1434  INR 1.1   Thyroid  Function Tests: Recent Labs    11/06/23 2125  TSH 2.828   Lipid Profile: Recent Labs    11/07/23 0504  CHOL 75  HDL 36*  LDLCALC 19  TRIG 98  CHOLHDL 2.1   Anemia Panel: Recent Labs    11/06/23 2124  VITAMINB12 216  FERRITIN 6*  TIBC 419  IRON  22*   Urine analysis:    Component Value Date/Time   COLORURINE STRAW (A) 11/06/2023 1507   APPEARANCEUR CLEAR 11/06/2023 1507   LABSPEC 1.009 11/06/2023 1507   PHURINE 5.0 11/06/2023 1507   GLUCOSEU >=500 (A) 11/06/2023 1507   HGBUR MODERATE (A) 11/06/2023 1507   HGBUR negative 07/14/2010 0944   BILIRUBINUR NEGATIVE 11/06/2023 1507   BILIRUBINUR n 08/18/2012 1114   KETONESUR NEGATIVE 11/06/2023 1507   PROTEINUR NEGATIVE 11/06/2023 1507   UROBILINOGEN 0.2 08/18/2012 1114   UROBILINOGEN 0.2 07/14/2010 0944   NITRITE NEGATIVE 11/06/2023 1507   LEUKOCYTESUR NEGATIVE 11/06/2023 1507   Sepsis Labs: Invalid input(s): PROCALCITONIN, LACTICIDVEN  Microbiology: Recent Results (from the past 240 hours)  Blood Culture (routine x 2)     Status: None (Preliminary result)   Collection Time: 11/06/23  2:34 PM   Specimen: BLOOD  Result Value Ref Range Status   Specimen Description   Final    BLOOD BLOOD LEFT HAND Performed at [facility], [address], [place], [place] [address]    Special Requests   Final    BOTTLES DRAWN AEROBIC AND ANAEROBIC Blood Culture results may not be optimal due to an inadequate  volume of blood received in  culture bottles Performed at [facility], [address] [place], [place] [address]    Culture   Final    NO GROWTH < 24 HOURS Performed at [facility], [address] [place], [place] [address]    Report Status PENDING  Incomplete  Blood Culture (routine x 2)     Status: None (Preliminary result)   Collection Time: 11/06/23  2:34 PM   Specimen: BLOOD  Result Value Ref Range Status   Specimen Description   Final    BLOOD BLOOD RIGHT ARM Performed at [facility], [address], [place], [place] [address]    Special Requests   Final    BOTTLES DRAWN AEROBIC AND ANAEROBIC Blood Culture results may not be optimal due to an inadequate volume of blood received in culture bottles Performed at [facility], [address] [place], [place] [address]    Culture   Final    NO GROWTH < 24 HOURS Performed at [facility], [address] [place], [place] [address]    Report Status PENDING  Incomplete  Resp panel by RT-PCR (RSV, Flu A&B, Covid) Anterior Nasal Swab     Status: None   Collection Time: 11/06/23  3:53 PM   Specimen: Anterior Nasal Swab  Result Value Ref Range Status   SARS Coronavirus 2 by RT PCR NEGATIVE NEGATIVE Final    Comment: (NOTE) SARS-CoV-2 target nucleic acids are NOT DETECTED.  The SARS-CoV-2 RNA is generally detectable in upper respiratory specimens during the acute phase of infection. The lowest concentration of SARS-CoV-2 viral copies this assay can detect is 138 copies/mL. A negative result does not preclude SARS-Cov-2 infection and should not be used as the sole basis for treatment or other patient management decisions. A negative result may occur with  improper specimen collection/handling, submission of specimen other than nasopharyngeal swab, presence of viral mutation(s) within the areas targeted by this assay, and inadequate number of viral copies(<138 copies/mL). A negative  result must be combined with clinical observations, patient history, and epidemiological information. The expected result is Negative.  Fact Sheet for Patients:  bloggercourse.com  Fact Sheet for Healthcare Providers:  seriousbroker.it  This test is no t yet approved or cleared by the [place]  FDA and  has been authorized for detection and/or diagnosis of SARS-CoV-2 by FDA under an Emergency Use Authorization (EUA). This EUA will remain  in effect (meaning this test can be used) for the duration of the COVID-19 declaration under Section 564(b)(1) of the Act, 21 U.S.C.section 360bbb-3(b)(1), unless the authorization is terminated  or revoked sooner.       Influenza A by PCR NEGATIVE NEGATIVE Final   Influenza B by PCR NEGATIVE NEGATIVE Final    Comment: (NOTE) The Xpert Xpress SARS-CoV-2/FLU/RSV plus assay is intended as an aid in the diagnosis of influenza from Nasopharyngeal swab specimens and should not be used as a sole basis for treatment. Nasal washings and aspirates are unacceptable for Xpert Xpress SARS-CoV-2/FLU/RSV testing.  Fact Sheet for Patients: bloggercourse.com  Fact Sheet for Healthcare Providers: seriousbroker.it  This test is not yet approved or cleared by the [place]  FDA and has been authorized for detection and/or diagnosis of SARS-CoV-2 by FDA under an Emergency Use Authorization (EUA). This EUA will remain in effect (meaning this test can be used) for the duration of the COVID-19 declaration under Section 564(b)(1) of the Act, 21 U.S.C. section 360bbb-3(b)(1),  unless the authorization is terminated or revoked.     Resp Syncytial Virus by PCR NEGATIVE NEGATIVE Final    Comment: (NOTE) Fact Sheet for Patients: bloggercourse.com  Fact Sheet for Healthcare Providers: seriousbroker.it  This  test is not yet approved or cleared by the [place]  FDA and has been authorized for detection and/or diagnosis of SARS-CoV-2 by FDA under an Emergency Use Authorization (EUA). This EUA will remain in effect (meaning this test can be used) for the duration of the COVID-19 declaration under Section 564(b)(1) of the Act, 21 U.S.C. section 360bbb-3(b)(1), unless the authorization is terminated or revoked.  Performed at [facility], [address] [place], [place] [address]   Respiratory (~20 pathogens) panel by PCR     Status: Abnormal   Collection Time: 11/06/23  9:23 PM   Specimen: Nasopharyngeal Swab; Respiratory  Result Value Ref Range Status   Adenovirus NOT DETECTED NOT DETECTED Final   Coronavirus 229E NOT DETECTED NOT DETECTED Final    Comment: (NOTE) The Coronavirus on the Respiratory Panel, DOES NOT test for the novel  Coronavirus (2019 nCoV)    Coronavirus HKU1 NOT DETECTED NOT DETECTED Final   Coronavirus NL63 NOT DETECTED NOT DETECTED Final   Coronavirus OC43 NOT DETECTED NOT DETECTED Final   Metapneumovirus NOT DETECTED NOT DETECTED Final   Rhinovirus / Enterovirus NOT DETECTED NOT DETECTED Final   Influenza A H1 2009 DETECTED (A) NOT DETECTED Final   Influenza B NOT DETECTED NOT DETECTED Final   Parainfluenza Virus 1 NOT DETECTED NOT DETECTED Final   Parainfluenza Virus 2 NOT DETECTED NOT DETECTED Final   Parainfluenza Virus 3 NOT DETECTED NOT DETECTED Final   Parainfluenza Virus 4 NOT DETECTED NOT DETECTED Final   Respiratory Syncytial Virus NOT DETECTED NOT DETECTED Final   Bordetella pertussis NOT DETECTED NOT DETECTED Final   Bordetella Parapertussis NOT DETECTED NOT DETECTED Final   Chlamydophila pneumoniae NOT DETECTED NOT DETECTED Final   Mycoplasma pneumoniae NOT DETECTED NOT DETECTED Final    Comment: Performed at [facility], [address] [place], [place] [address]    Radiology Studies: CT ABDOMEN PELVIS W  CONTRAST Result Date: 11/06/2023 CLINICAL DATA:  Fever and weakness. EXAM: CT ABDOMEN AND PELVIS WITH CONTRAST TECHNIQUE: Multidetector CT imaging of the abdomen and pelvis was performed using the standard protocol following bolus administration of intravenous contrast. RADIATION DOSE REDUCTION: This exam was performed according to the departmental dose-optimization program which includes automated exposure control, adjustment of the mA and/or kV according to patient size and/or use of iterative reconstruction technique. CONTRAST:  OMNIPAQUE  IOHEXOL  300 MG/ML  SOLN COMPARISON:  April 20, 2016 FINDINGS: Lower chest: Mild scarring and/or atelectasis is seen within the posterior aspect of the bilateral lung bases. Hepatobiliary: No focal liver abnormality is seen. No gallstones, gallbladder wall thickening, or biliary dilatation. Pancreas: Unremarkable. No pancreatic ductal dilatation or surrounding inflammatory changes. Spleen: Normal in size without focal abnormality. Adrenals/Urinary Tract: Adrenal glands are unremarkable. Kidneys are normal in size, without focal lesions. Stable prominence of an extrarenal pelvis is noted on the right. No renal calculi are identified. Bladder is unremarkable. Stomach/Bowel: Stomach is within normal limits. The appendix is surgically absent. No evidence of bowel wall thickening, distention, or inflammatory changes. Vascular/Lymphatic: Aortic atherosclerosis with marked severity calcification of the bilateral common iliac arteries. No enlarged abdominal or pelvic lymph nodes. Reproductive: Status post hysterectomy. No adnexal masses. Other: No abdominal wall hernia or abnormality. No abdominopelvic ascites. Musculoskeletal: Marked severity  multilevel degenerative changes are seen throughout the lumbar spine. IMPRESSION: 1. No acute or active process within the abdomen or pelvis. 2. Evidence of prior hysterectomy and prior appendectomy. 3. Aortic atherosclerosis. Aortic  Atherosclerosis (ICD10-I70.0). Electronically Signed   By: Suzen Dials M.D.   On: 11/06/2023 20:27   CT HEAD WO CONTRAST ( ) Result Date: 11/06/2023 CLINICAL DATA:  Fever and weakness. EXAM: CT HEAD WITHOUT CONTRAST TECHNIQUE: Contiguous axial images were obtained from the base of the skull through the vertex without intravenous contrast. RADIATION DOSE REDUCTION: This exam was performed according to the departmental dose-optimization program which includes automated exposure control, adjustment of the mA and/or kV according to patient size and/or use of iterative reconstruction technique. COMPARISON:  April 14, 2016 FINDINGS: Brain: There is moderate severity cerebral atrophy with widening of the extra-axial spaces and ventricular dilatation. There are areas of decreased attenuation within the white matter tracts of the supratentorial brain, consistent with microvascular disease changes. Vascular: Mild to moderate severity bilateral cavernous carotid artery calcification is noted. Skull: Negative for acute fracture or focal lesion. Sinuses/Orbits: No acute finding. Other: None. IMPRESSION: 1. No acute intracranial abnormality. 2. Generalized cerebral atrophy with chronic white matter small vessel ischemic changes. Electronically Signed   By: Suzen Dials M.D.   On: 11/06/2023 20:10   DG Chest Port 1 View Result Date: 11/06/2023 CLINICAL DATA:  Questionable sepsis - evaluate for abnormality Weakness and fever. EXAM: PORTABLE CHEST 1 VIEW COMPARISON:  04/14/2016 FINDINGS: Right chest port in place. Normal heart size.The cardiomediastinal contours are normal. Aortic atherosclerosis. Pulmonary vasculature is normal. No consolidation, pleural effusion, or pneumothorax. No acute osseous abnormalities are seen. IMPRESSION: No active disease. Electronically Signed   By: Andrea Gasman M.D.   On: 11/06/2023 15:26      Marisa Davenport T. Ardis [facility]  If 7PM-7AM, please contact  night-coverage [email or web address] 11/07/2023, 12:21 PM

## 2023-11-07 NOTE — Progress Notes (Signed)
 OT Cancellation Note  Patient Details Name: Marisa Davenport MRN: [id number] DOB: [birth year]   Cancelled Treatment:    Reason Eval/Treat Not Completed: Medical issues which prohibited therapy. Orders received, chart reviewed. Pt noted with hemoglobin 6.8. OT will hold at this time and re-attempt when medically appropriate.   Javad Salva L. Sister Carbone, OTR/L  11/07/23, 8:37 AM

## 2023-11-07 NOTE — Progress Notes (Signed)
 PT Cancellation Note  Patient Details Name: Marisa Davenport MRN: [id number] DOB: 1941-01-10   Cancelled Treatment:    Reason Eval/Treat Not Completed: Medical issues which prohibited therapy Low HGB, getting  blood products today. Darice Potters PT Acute Rehabilitation Services Office [phone] Weekend [phone]   Potters Darice Norris 11/07/2023, 11:16 AM

## 2023-11-07 NOTE — TOC Initial Note (Signed)
 Transition of Care [facility]) - Initial/Assessment Note   Patient Details  Name: Marisa Davenport MRN: [id number] Date of Birth: 17-Dec-1940  Transition of Care [facility]) CM/SW Contact:    Duwaine GORMAN Aran, LCSW Phone Number: 11/07/2023, 12:55 PM  Clinical Narrative: Patient is from [facility]. PT/OT have been consulted, but PT is unable to assess patient today as patient is receiving blood products. TOC awaiting PT/OT evaluations.              Expected Discharge Plan:  (TBD) Barriers to Discharge: Continued Medical Work up  Expected Discharge Plan and Services In-house Referral: Clinical Social Work Living arrangements for the past 2 months: Assisted Living Facility  Prior Living Arrangements/Services Living arrangements for the past 2 months: Assisted Living Facility Lives with:: Facility Resident Patient language and need for interpreter reviewed:: Yes Do you feel safe going back to the place where you live?: Yes      Need for Family Participation in Patient Care: Yes (Comment) (Patient is oriented x2.) Care giver support system in place?: Yes (comment) Criminal Activity/Legal Involvement Pertinent to Current Situation/Hospitalization: No - Comment as needed  Activities of Daily Living ADL Screening (condition at time of admission) Independently performs ADLs?: No Does the patient have a NEW difficulty with bathing/dressing/toileting/self-feeding that is expected to last >3 days?: Yes (Initiates electronic notice to provider for possible OT consult) Does the patient have a NEW difficulty with getting in/out of bed, walking, or climbing stairs that is expected to last >3 days?: Yes (Initiates electronic notice to provider for possible PT consult) Does the patient have a NEW difficulty with communication that is expected to last >3 days?: No Is the patient deaf or have difficulty hearing?: No Does the patient have difficulty seeing, even when wearing glasses/contacts?: No Does the patient  have difficulty concentrating, remembering, or making decisions?: Yes  Emotional Assessment Orientation: : Oriented to Self, Oriented to Place Alcohol / Substance Use: Not Applicable Psych Involvement: No (comment)  Admission diagnosis:  Sepsis (HCC) [A41.9] Severe sepsis (HCC) [A41.9, R65.20] Patient Active Problem List   Diagnosis Date Noted   Influenza A H1N1 infection 11/07/2023   Severe sepsis (HCC) 11/06/2023   Microcytic anemia 11/06/2023   Acute metabolic encephalopathy 11/06/2023   Hypomagnesemia 11/06/2023   Hypophosphatemia 11/06/2023   Cellulitis and abscess of lower extremity    Bacteremia due to Streptococcus 04/16/2016   Cellulitis of leg, left 04/16/2016   T2DM (type 2 diabetes mellitus) (HCC) 04/16/2016   Generalized weakness 04/14/2016   Urinary incontinence 08/18/2012   Backache 07/14/2010   Type II or unspecified type diabetes mellitus without mention of complication, not stated as uncontrolled 08/13/2008   HYPERLIPIDEMIA 08/13/2008   Disorder of skin or subcutaneous tissue 06/19/2008   DE QUERVAIN'S TENOSYNOVITIS 06/19/2008   Allergic rhinitis 05/17/2007   LEG EDEMA, BILATERAL 05/17/2007   NON-HODGKIN'S LYMPHOMA, HX OF 05/17/2007   PCP:  [facility]:   [id number] [place], [place] - [address] AT [facility] OF [facility] [address] [place] [place] [address] Phone: [phone] Fax: [phone]  [facility] #[id number] - [place], [place] - [address] AT [facility] OF [address] [place] [address] Phone: [phone] Fax: [phone]  Social Drivers of Health (SDOH) Social History: SDOH Screenings   Food Insecurity: No Food Insecurity (11/07/2023)  Housing: Low Risk  (11/07/2023)  Social Connections: Socially Isolated (11/07/2023)  Tobacco  Use: Medium Risk (11/06/2023)   SDOH Interventions:    Readmission Risk Interventions     No data to display

## 2023-11-07 NOTE — Plan of Care (Signed)
  Problem: Coping: Goal: Ability to adjust to condition or change in health will improve Outcome: Progressing   Problem: Fluid Volume: Goal: Ability to maintain a balanced intake and output will improve Outcome: Progressing   Problem: Metabolic: Goal: Ability to maintain appropriate glucose levels will improve Outcome: Progressing   Problem: Nutritional: Goal: Maintenance of adequate nutrition will improve Outcome: Progressing Goal: Progress toward achieving an optimal weight will improve Outcome: Progressing   Problem: Tissue Perfusion: Goal: Adequacy of tissue perfusion will improve Outcome: Progressing   Problem: Education: Goal: Knowledge of General Education information will improve Description: Including pain rating scale, medication(s)/side effects and non-pharmacologic comfort measures Outcome: Progressing   Problem: Activity: Goal: Risk for activity intolerance will decrease Outcome: Progressing   Problem: Nutrition: Goal: Adequate nutrition will be maintained Outcome: Progressing   Problem: Coping: Goal: Level of anxiety will decrease Outcome: Progressing   Problem: Safety: Goal: Ability to remain free from injury will improve Outcome: Progressing   Problem: Education: Goal: Ability to describe self-care measures that may prevent or decrease complications (Diabetes Survival Skills Education) will improve Outcome: Not Progressing   Problem: Skin Integrity: Goal: Risk for impaired skin integrity will decrease Outcome: Adequate for Discharge   Problem: Skin Integrity: Goal: Risk for impaired skin integrity will decrease Outcome: Adequate for Discharge

## 2023-11-07 NOTE — Progress Notes (Signed)
 Marisa Davenport was doing well during blood transfusion and no negative reaction. Her VS was good, but her temperature went to high 103 and on 1/12 her temp was high also. After gave her tylenol  and temp went back down to 98.8. She ate supper well 60% and took 480 ml fluid. She is alert and oriented x3.

## 2023-11-08 DIAGNOSIS — G9341 Metabolic encephalopathy: Secondary | ICD-10-CM | POA: Diagnosis not present

## 2023-11-08 DIAGNOSIS — A419 Sepsis, unspecified organism: Secondary | ICD-10-CM | POA: Diagnosis not present

## 2023-11-08 LAB — TYPE AND SCREEN
ABO/RH(D): A NEG
Antibody Screen: NEGATIVE
Unit division: 0

## 2023-11-08 LAB — BPAM RBC
Blood Product Expiration Date: 202502162359
ISSUE DATE / TIME: 202501131032
Unit Type and Rh: 600

## 2023-11-08 LAB — GLUCOSE, CAPILLARY
Glucose-Capillary: 159 mg/dL — ABNORMAL HIGH (ref 70–99)
Glucose-Capillary: 160 mg/dL — ABNORMAL HIGH (ref 70–99)
Glucose-Capillary: 161 mg/dL — ABNORMAL HIGH (ref 70–99)
Glucose-Capillary: 164 mg/dL — ABNORMAL HIGH (ref 70–99)
Glucose-Capillary: 171 mg/dL — ABNORMAL HIGH (ref 70–99)
Glucose-Capillary: 187 mg/dL — ABNORMAL HIGH (ref 70–99)
Glucose-Capillary: 198 mg/dL — ABNORMAL HIGH (ref 70–99)

## 2023-11-08 LAB — PROCALCITONIN: Procalcitonin: 0.31 ng/mL

## 2023-11-08 LAB — CBC WITH DIFFERENTIAL/PLATELET
Abs Immature Granulocytes: 0.02 10*3/uL (ref 0.00–0.07)
Basophils Absolute: 0 10*3/uL (ref 0.0–0.1)
Basophils Relative: 1 %
Eosinophils Absolute: 0 10*3/uL (ref 0.0–0.5)
Eosinophils Relative: 0 %
HCT: 30.3 % — ABNORMAL LOW (ref 36.0–46.0)
Hemoglobin: 8.6 g/dL — ABNORMAL LOW (ref 12.0–15.0)
Immature Granulocytes: 1 %
Lymphocytes Relative: 28 %
Lymphs Abs: 1.1 10*3/uL (ref 0.7–4.0)
MCH: 18.9 pg — ABNORMAL LOW (ref 26.0–34.0)
MCHC: 28.4 g/dL — ABNORMAL LOW (ref 30.0–36.0)
MCV: 66.4 fL — ABNORMAL LOW (ref 80.0–100.0)
Monocytes Absolute: 0.5 10*3/uL (ref 0.1–1.0)
Monocytes Relative: 14 %
Neutro Abs: 2.2 10*3/uL (ref 1.7–7.7)
Neutrophils Relative %: 56 %
Platelets: 171 10*3/uL (ref 150–400)
RBC: 4.56 MIL/uL (ref 3.87–5.11)
RDW: 25.5 % — ABNORMAL HIGH (ref 11.5–15.5)
WBC: 3.9 10*3/uL — ABNORMAL LOW (ref 4.0–10.5)
nRBC: 0 % (ref 0.0–0.2)

## 2023-11-08 LAB — RENAL FUNCTION PANEL
Albumin: 3 g/dL — ABNORMAL LOW (ref 3.5–5.0)
Anion gap: 7 (ref 5–15)
BUN: 12 mg/dL (ref 8–23)
CO2: 25 mmol/L (ref 22–32)
Calcium: 8 mg/dL — ABNORMAL LOW (ref 8.9–10.3)
Chloride: 102 mmol/L (ref 98–111)
Creatinine, Ser: 0.81 mg/dL (ref 0.44–1.00)
GFR, Estimated: >60 mL/min (ref >60)
Glucose, Bld: 172 mg/dL — ABNORMAL HIGH (ref 70–99)
Phosphorus: 2.9 mg/dL (ref 2.5–4.6)
Potassium: 3.4 mmol/L — ABNORMAL LOW (ref 3.5–5.1)
Sodium: 134 mmol/L — ABNORMAL LOW (ref 135–145)

## 2023-11-08 LAB — C DIFFICILE QUICK SCREEN W PCR REFLEX
C Diff antigen: NEGATIVE
C Diff interpretation: NOT DETECTED
C Diff toxin: NEGATIVE

## 2023-11-08 LAB — MAGNESIUM: Magnesium: 2 mg/dL (ref 1.7–2.4)

## 2023-11-08 MED ORDER — INSULIN GLARGINE-YFGN 100 UNIT/ML ~~LOC~~ SOLN
22.0000 [IU] | Freq: Every day | SUBCUTANEOUS | Status: DC
  Administered 2023-11-09 – 2023-11-10 (×2): 22 [IU] via SUBCUTANEOUS
  Filled 2023-11-08 (×2): qty 0.22

## 2023-11-08 MED ORDER — PNEUMOCOCCAL 20-VAL CONJ VACC 0.5 ML IM SUSY
0.5000 mL | PREFILLED_SYRINGE | INTRAMUSCULAR | Status: AC
  Administered 2023-11-10: 0.5 mL via INTRAMUSCULAR
  Filled 2023-11-08: qty 0.5

## 2023-11-08 MED ORDER — INFLUENZA VAC A&B SURF ANT ADJ 0.5 ML IM SUSY
0.5000 mL | PREFILLED_SYRINGE | INTRAMUSCULAR | Status: AC
  Administered 2023-11-10: 0.5 mL via INTRAMUSCULAR
  Filled 2023-11-08: qty 0.5

## 2023-11-08 MED ORDER — POTASSIUM CHLORIDE CRYS ER 20 MEQ PO TBCR
60.0000 meq | EXTENDED_RELEASE_TABLET | Freq: Once | ORAL | Status: AC
Start: 2023-11-08 — End: 2023-11-08
  Administered 2023-11-08: 60 meq via ORAL
  Filled 2023-11-08: qty 3

## 2023-11-08 NOTE — Plan of Care (Signed)
  Problem: Education: Goal: Ability to describe self-care measures that may prevent or decrease complications (Diabetes Survival Skills Education) will improve Outcome: Progressing   Problem: Safety: Goal: Ability to remain free from injury will improve Outcome: Progressing   Problem: Skin Integrity: Goal: Risk for impaired skin integrity will decrease Outcome: Progressing

## 2023-11-08 NOTE — Progress Notes (Addendum)
 PROGRESS NOTE  Marisa Davenport FMW:[id number] DOB: 1941-04-06   PCP: [facility]  Patient is from: ALF.  Wheelchair dependent for most part.  Uses rolling walker with assistance  DOA: 11/06/2023 LOS: 2  Chief complaints Chief Complaint  Patient presents with   Altered Mental Status   Weakness     Brief Narrative / Interim history: 83 year old F with PMH of non-Hodgkin's lymphoma in remission, CVA with left-sided weakness, DM-2, HTN, HLD CAD presenting with fever, altered mental status and general weakness, and admitted with concern for severe sepsis.  Poorly febrile to 103.5 at facility.  In ED, she was febrile to 102.3.  Was tachycardic and tachypneic but not hypotensive.  No leukocytosis.  Anemic to 7.3 (11.9 about 7 years ago).  Hemoccult negative.  Take acid 2.0.  COVID-19, influenza and RSV PCR nonreactive.  CXR without acute finding.  Patient was started on broad-spectrum antibiotics.  And admitted with working diagnosis of severe sepsis without clear source.    The next day, procalcitonin negative.  Blood cultures NGTD.  Full RVP positive for influenza A infection.  Started on Tamiflu .  Antibiotics discontinued.  Improving.  Fever seems to be resolving.  Subjective: Seen and examined earlier this morning.  No major events overnight of this morning.  No further fevers since yesterday afternoon.  No complaints.  She is sleepy but wakes to voice.  She is oriented x 4 except the date.  Objective: Vitals:   11/07/23 2019 11/08/23 0413 11/08/23 0730 11/08/23 0949  BP: 126/63 139/72 (!) 115/55   Pulse: 95 (!) 102 94   Resp: 18 18 19    Temp: 99.7 F (37.6 C) 99.1 F (37.3 C) 99 F (37.2 C) 98.4 F (36.9 C)  TempSrc: Oral Oral Oral Oral  SpO2: 90% 94% 94%   Weight:      Height:        Examination:  GENERAL: No apparent distress.  Nontoxic. HEENT: MMM.  Vision and hearing grossly intact.  NECK: Supple.  No apparent JVD.  RESP:  No IWOB.  Fair aeration  bilaterally. CVS:  RRR. Heart sounds normal.  ABD/GI/GU: BS+. Abd soft, NTND.  MSK/EXT:  Moves extremities. No apparent deformity. No edema.  SKIN: no apparent skin lesion or wound NEURO: Sleepy but wakes to voice.  Oriented x 4 except date.  Follows commands.  No apparent focal neuro deficit. PSYCH: Calm. Normal affect.   Procedures:  None  Microbiology summarized: A 20 pathogen RVP positive for influenza A.   COVID-19, RSV P and influenza B PCR negative. Cultures NGTD  Assessment and plan: Severe sepsis due to influenza A infection: Surprisingly, no respiratory symptoms other than tachypnea.  She had SIRS with fever, tachypnea and tachycardia.  Has lactic acidosis and mental status change as evidence of endorgan damage.  CXR without acute finding.  CT abdomen and pelvis unrevealing.  No skin break or signs of cellulitis.  Culture data as above.  Procalcitonin negative.  No leukocytosis.  Fever curve downtrending. -Antibiotics discontinued on 1/13. -Continue Tamiflu  30 mg twice daily for 5 days 1/13>> -IV fluid discontinued.    Acute metabolic encephalopathy: Likely due to the above.  Has no focal neurodeficit.  CT head, TSH and B12 unrevealing.  Low suspicion for seizure.  Takes Tylenol  PM at home.  Improving. -Treat treatable causes -Avoid sedating medications -Reorientation and delirium precaution -Fall and aspiration precaution -PT/OT   Microcytic iron  deficiency anemia: Hgb dropped to 6.8.  Unknown baseline.  Hemoccult negative.  No overt bleeding.  Drop in Hgb likely dilutional.  Anemia panel with severe iron  deficiency.  Improved after 1 unit on 1/13.  Received IV Venofer  300 mg x 1 on 1/13. Recent Labs    11/06/23 1434 11/06/23 2222 11/07/23 0504 11/08/23 0509  HGB 7.3* 7.6* 6.8* 8.6*  -Monitor H&H   IDDM-2 with hyperglycemia and hyperlipidemia: A1c 10.4% (no recent value for comparison).  Uses Tresiba  18 units daily at home. Recent Labs  Lab 11/07/23 1943  11/08/23 0003 11/08/23 0415 11/08/23 0728 11/08/23 1127  GLUCAP 206* 198* 171* 161* 187*  -Continue SSI-moderate -Increase Semglee  from 18 to 22 units daily -Carb modified diet -Continue Lipitor  History of CVA with some residual left-sided weakness.  No focal neurodeficit on my exam.  CT head without acute finding. -Continue aspirin , Plavix  and atorvastatin  -Follow-up repeat lipid panel   Hypokalemia/hypomagnesemia/hypophosphatemia -Monitor replenish as appropriate   Generalized weakness: Multifactorial including sepsis, dehydration, anemia and electrolyte derangement -Treat treatable causes -PT/OT  Dehydration: In the setting of acute illness.  Resolved -Discontinue IV fluid  Hyponatremia: Mild -Continue monitoring  Obesity class I  Body mass index is 33.98 kg/m.          DVT prophylaxis:  SCDs Start: 11/06/23 1931  Code Status: DNR/DNI Family Communication: Updated patient's sister over the phone Level of care: Progressive Status is: Inpatient Remains inpatient appropriate because: Severe sepsis due to influenza A infection, dehydration and electrolyte derangements   Final disposition: Likely back to ALF in the next 24 to 48 hours Consultants:  None  55 minutes with more than 50% spent in reviewing records, counseling patient/family and coordinating care.   Sch Meds:  Scheduled Meds:  aspirin  EC  81 mg Oral Daily   atorvastatin   40 mg Oral QHS   clopidogrel   75 mg Oral Daily   [START ON 11/09/2023] influenza vaccine adjuvanted  0.5 mL Intramuscular Tomorrow-1000   insulin  aspart  0-15 Units Subcutaneous Q4H   [START ON 11/09/2023] insulin  glargine-yfgn  22 Units Subcutaneous Daily   oseltamivir   30 mg Oral BID   [START ON 11/09/2023] pneumococcal 20-valent conjugate vaccine  0.5 mL Intramuscular Tomorrow-1000   Continuous Infusions:   PRN Meds:.acetaminophen  **OR** acetaminophen , labetalol , ondansetron  **OR** ondansetron  (ZOFRAN ) IV,  senna-docusate  Antimicrobials: Anti-infectives (From admission, onward)    Start     Dose/Rate Route Frequency Ordered Stop   11/08/23 0600  vancomycin  (VANCOREADY) IVPB 1250 mg/250 mL  Status:  Discontinued        1,250 mg 166.7 mL/hr over 90 Minutes Intravenous Every 36 hours 11/06/23 2357 11/07/23 1116   11/07/23 1000  oseltamivir  (TAMIFLU ) capsule 30 mg        30 mg Oral 2 times daily 11/07/23 0836 11/12/23 0959   11/07/23 0400  metroNIDAZOLE  (FLAGYL ) IVPB 500 mg  Status:  Discontinued        500 mg 100 mL/hr over 60 Minutes Intravenous Every 12 hours 11/06/23 2250 11/07/23 1116   11/07/23 0400  ceFEPIme  (MAXIPIME ) 2 g in sodium chloride  0.9 % 100 mL IVPB  Status:  Discontinued        2 g 200 mL/hr over 30 Minutes Intravenous Every 12 hours 11/06/23 2301 11/07/23 1116   11/06/23 1630  ceFEPIme  (MAXIPIME ) 2 g in sodium chloride  0.9 % 100 mL IVPB        2 g 200 mL/hr over 30 Minutes Intravenous STAT 11/06/23 1619 11/06/23 1700   11/06/23 1545  aztreonam (AZACTAM) 2 g in sodium chloride  0.9 %  100 mL IVPB  Status:  Discontinued        2 g 200 mL/hr over 30 Minutes Intravenous  Once 11/06/23 1540 11/06/23 1619   11/06/23 1545  metroNIDAZOLE  (FLAGYL ) IVPB 500 mg        500 mg 100 mL/hr over 60 Minutes Intravenous  Once 11/06/23 1540 11/06/23 1653   11/06/23 1545  vancomycin  (VANCOCIN ) IVPB 1000 mg/200 mL premix        1,000 mg 200 mL/hr over 60 Minutes Intravenous  Once 11/06/23 1540 11/06/23 1910        I have personally reviewed the following labs and images: CBC: Recent Labs  Lab 11/06/23 1434 11/06/23 2222 11/07/23 0504 11/08/23 0509  WBC 4.7  --  3.0* 3.9*  NEUTROABS 3.5  --   --  2.2  HGB 7.3* 7.6* 6.8* 8.6*  HCT 26.2* 27.0* 24.6* 30.3*  MCV 62.5*  --  62.0* 66.4*  PLT 228  --  196 171   BMP &GFR Recent Labs  Lab 11/06/23 1434 11/06/23 2124 11/07/23 0504 11/08/23 0509  NA 134*  --  136 134*  K 3.5  --  3.4* 3.4*  CL 102  --  105 102  CO2 22  --  22 25   GLUCOSE 279*  --  214* 172*  BUN 14  --  10 12  CREATININE 0.93  --  0.69 0.81  CALCIUM  8.2*  --  8.0* 8.0*  MG  --  1.5* 1.8 2.0  PHOS  --  2.1* 3.7 2.9   Estimated Creatinine Clearance: 57.9 mL/min (by C-G formula based on SCr of 0.81 mg/dL). Liver & Pancreas: Recent Labs  Lab 11/06/23 1434 11/08/23 0509  AST 21  --   ALT 16  --   ALKPHOS 102  --   BILITOT 0.7  --   PROT 7.1  --   ALBUMIN 3.5 3.0*   No results for input(s): LIPASE, AMYLASE in the last 168 hours. No results for input(s): AMMONIA in the last 168 hours. Diabetic: Recent Labs    11/07/23 0504  HGBA1C 10.4*   Recent Labs  Lab 11/07/23 1943 11/08/23 0003 11/08/23 0415 11/08/23 0728 11/08/23 1127  GLUCAP 206* 198* 171* 161* 187*   Cardiac Enzymes: No results for input(s): CKTOTAL, CKMB, CKMBINDEX, TROPONINI in the last 168 hours. No results for input(s): PROBNP in the last 8760 hours. Coagulation Profile: Recent Labs  Lab 11/06/23 1434  INR 1.1   Thyroid  Function Tests: Recent Labs    11/06/23 2125  TSH 2.828   Lipid Profile: Recent Labs    11/07/23 0504  CHOL 75  HDL 36*  LDLCALC 19  TRIG 98  CHOLHDL 2.1   Anemia Panel: Recent Labs    11/06/23 2124  VITAMINB12 216  FERRITIN 6*  TIBC 419  IRON  22*   Urine analysis:    Component Value Date/Time   COLORURINE STRAW (A) 11/06/2023 1507   APPEARANCEUR CLEAR 11/06/2023 1507   LABSPEC 1.009 11/06/2023 1507   PHURINE 5.0 11/06/2023 1507   GLUCOSEU >=500 (A) 11/06/2023 1507   HGBUR MODERATE (A) 11/06/2023 1507   HGBUR negative 07/14/2010 0944   BILIRUBINUR NEGATIVE 11/06/2023 1507   BILIRUBINUR n 08/18/2012 1114   KETONESUR NEGATIVE 11/06/2023 1507   PROTEINUR NEGATIVE 11/06/2023 1507   UROBILINOGEN 0.2 08/18/2012 1114   UROBILINOGEN 0.2 07/14/2010 0944   NITRITE NEGATIVE 11/06/2023 1507   LEUKOCYTESUR NEGATIVE 11/06/2023 1507   Sepsis Labs: Invalid input(s): PROCALCITONIN,  LACTICIDVEN  Microbiology: Recent Results (  from the past 240 hours)  Blood Culture (routine x 2)     Status: None (Preliminary result)   Collection Time: 11/06/23  2:34 PM   Specimen: BLOOD  Result Value Ref Range Status   Specimen Description   Final    BLOOD BLOOD LEFT HAND Performed at [facility], [address] [place], [place] [address]    Special Requests   Final    BOTTLES DRAWN AEROBIC AND ANAEROBIC Blood Culture results may not be optimal due to an inadequate volume of blood received in culture bottles Performed at [facility], [address], [place], [place] [address]    Culture   Final    NO GROWTH 2 DAYS Performed at [facility], [address] [place], [place] [address]    Report Status PENDING  Incomplete  Blood Culture (routine x 2)     Status: None (Preliminary result)   Collection Time: 11/06/23  2:34 PM   Specimen: BLOOD  Result Value Ref Range Status   Specimen Description   Final    BLOOD BLOOD RIGHT ARM Performed at [facility], [address], [place], [place] [address]    Special Requests   Final    BOTTLES DRAWN AEROBIC AND ANAEROBIC Blood Culture results may not be optimal due to an inadequate volume of blood received in culture bottles Performed at [facility], [address], [place], [place] [address]    Culture   Final    NO GROWTH 2 DAYS Performed at [facility], [address] [place], [place] [address]    Report Status PENDING  Incomplete  Resp panel by RT-PCR (RSV, Flu A&B, Covid) Anterior Nasal Swab     Status: None   Collection Time: 11/06/23  3:53 PM   Specimen: Anterior Nasal Swab  Result Value Ref Range Status   SARS Coronavirus 2 by RT PCR NEGATIVE NEGATIVE Final    Comment: (NOTE) SARS-CoV-2 target nucleic acids are NOT DETECTED.  The SARS-CoV-2 RNA is generally detectable in upper respiratory specimens during the acute phase of  infection. The lowest concentration of SARS-CoV-2 viral copies this assay can detect is 138 copies/mL. A negative result does not preclude SARS-Cov-2 infection and should not be used as the sole basis for treatment or other patient management decisions. A negative result may occur with  improper specimen collection/handling, submission of specimen other than nasopharyngeal swab, presence of viral mutation(s) within the areas targeted by this assay, and inadequate number of viral copies(<138 copies/mL). A negative result must be combined with clinical observations, patient history, and epidemiological information. The expected result is Negative.  Fact Sheet for Patients:  bloggercourse.com  Fact Sheet for Healthcare Providers:  seriousbroker.it  This test is no t yet approved or cleared by the [place]  FDA and  has been authorized for detection and/or diagnosis of SARS-CoV-2 by FDA under an Emergency Use Authorization (EUA). This EUA will remain  in effect (meaning this test can be used) for the duration of the COVID-19 declaration under Section 564(b)(1) of the Act, 21 U.S.C.section 360bbb-3(b)(1), unless the authorization is terminated  or revoked sooner.       Influenza A by PCR NEGATIVE NEGATIVE Final   Influenza B by PCR NEGATIVE NEGATIVE Final    Comment: (NOTE) The Xpert Xpress SARS-CoV-2/FLU/RSV plus assay is intended as an aid in the diagnosis of influenza from Nasopharyngeal swab specimens and should not be used as a sole  basis for treatment. Nasal washings and aspirates are unacceptable for Xpert Xpress SARS-CoV-2/FLU/RSV testing.  Fact Sheet for Patients: bloggercourse.com  Fact Sheet for Healthcare Providers: seriousbroker.it  This test is not yet approved or cleared by the [place]  FDA and has been authorized for detection and/or diagnosis of SARS-CoV-2  by FDA under an Emergency Use Authorization (EUA). This EUA will remain in effect (meaning this test can be used) for the duration of the COVID-19 declaration under Section 564(b)(1) of the Act, 21 U.S.C. section 360bbb-3(b)(1), unless the authorization is terminated or revoked.     Resp Syncytial Virus by PCR NEGATIVE NEGATIVE Final    Comment: (NOTE) Fact Sheet for Patients: bloggercourse.com  Fact Sheet for Healthcare Providers: seriousbroker.it  This test is not yet approved or cleared by the [place]  FDA and has been authorized for detection and/or diagnosis of SARS-CoV-2 by FDA under an Emergency Use Authorization (EUA). This EUA will remain in effect (meaning this test can be used) for the duration of the COVID-19 declaration under Section 564(b)(1) of the Act, 21 U.S.C. section 360bbb-3(b)(1), unless the authorization is terminated or revoked.  Performed at [facility], [address], [place], [place] [address]   Respiratory (~20 pathogens) panel by PCR     Status: Abnormal   Collection Time: 11/06/23  9:23 PM   Specimen: Nasopharyngeal Swab; Respiratory  Result Value Ref Range Status   Adenovirus NOT DETECTED NOT DETECTED Final   Coronavirus 229E NOT DETECTED NOT DETECTED Final    Comment: (NOTE) The Coronavirus on the Respiratory Panel, DOES NOT test for the novel  Coronavirus (2019 nCoV)    Coronavirus HKU1 NOT DETECTED NOT DETECTED Final   Coronavirus NL63 NOT DETECTED NOT DETECTED Final   Coronavirus OC43 NOT DETECTED NOT DETECTED Final   Metapneumovirus NOT DETECTED NOT DETECTED Final   Rhinovirus / Enterovirus NOT DETECTED NOT DETECTED Final   Influenza A H1 2009 DETECTED (A) NOT DETECTED Final   Influenza B NOT DETECTED NOT DETECTED Final   Parainfluenza Virus 1 NOT DETECTED NOT DETECTED Final   Parainfluenza Virus 2 NOT DETECTED NOT DETECTED Final   Parainfluenza Virus 3 NOT  DETECTED NOT DETECTED Final   Parainfluenza Virus 4 NOT DETECTED NOT DETECTED Final   Respiratory Syncytial Virus NOT DETECTED NOT DETECTED Final   Bordetella pertussis NOT DETECTED NOT DETECTED Final   Bordetella Parapertussis NOT DETECTED NOT DETECTED Final   Chlamydophila pneumoniae NOT DETECTED NOT DETECTED Final   Mycoplasma pneumoniae NOT DETECTED NOT DETECTED Final    Comment: Performed at [facility], [address] [place], [place] [address]    Radiology Studies: No results found.     Woodley Petzold T. Caelen Higinbotham [facility]  If 7PM-7AM, please contact night-coverage [email or web address] 11/08/2023, 1:05 PM

## 2023-11-08 NOTE — Progress Notes (Signed)
 Pt had 8 loose BM between type 6 and type 7 today that caused some redness on buttocks and perineum area. used purple bottle lotion each time after cleaned up and put foam on, but not staying on well due to cream. Notified Dr. Gonfa and ordered the lab. Will keep checking on pt and reposition pt every two hours.

## 2023-11-08 NOTE — Plan of Care (Signed)
  Problem: Education: Goal: Ability to describe self-care measures that may prevent or decrease complications (Diabetes Survival Skills Education) will improve Outcome: Progressing   Problem: Coping: Goal: Ability to adjust to condition or change in health will improve Outcome: Progressing   Problem: Fluid Volume: Goal: Ability to maintain a balanced intake and output will improve Outcome: Progressing   Problem: Health Behavior/Discharge Planning: Goal: Ability to identify and utilize available resources and services will improve Outcome: Progressing Goal: Ability to manage health-related needs will improve Outcome: Progressing   Problem: Metabolic: Goal: Ability to maintain appropriate glucose levels will improve Outcome: Progressing   Problem: Nutritional: Goal: Maintenance of adequate nutrition will improve Outcome: Progressing Goal: Progress toward achieving an optimal weight will improve Outcome: Progressing   Problem: Tissue Perfusion: Goal: Adequacy of tissue perfusion will improve Outcome: Progressing   Problem: Education: Goal: Knowledge of General Education information will improve Description: Including pain rating scale, medication(s)/side effects and non-pharmacologic comfort measures Outcome: Progressing   Problem: Clinical Measurements: Goal: Will remain free from infection Outcome: Progressing Goal: Diagnostic test results will improve Outcome: Progressing   Problem: Activity: Goal: Risk for activity intolerance will decrease Outcome: Progressing   Problem: Nutrition: Goal: Adequate nutrition will be maintained Outcome: Progressing   Problem: Elimination: Goal: Will not experience complications related to bowel motility Outcome: Progressing Goal: Will not experience complications related to urinary retention Outcome: Progressing   Problem: Safety: Goal: Ability to remain free from injury will improve Outcome: Progressing   Problem: Skin  Integrity: Goal: Risk for impaired skin integrity will decrease Outcome: Adequate for Discharge   Problem: Clinical Measurements: Goal: Respiratory complications will improve Outcome: Adequate for Discharge Goal: Cardiovascular complication will be avoided Outcome: Adequate for Discharge   Problem: Pain Management: Goal: General experience of comfort will improve Outcome: Adequate for Discharge   Problem: Skin Integrity: Goal: Risk for impaired skin integrity will decrease Outcome: Adequate for Discharge

## 2023-11-08 NOTE — TOC Progression Note (Signed)
 Transition of Care [facility]) - Progression Note   Patient Details  Name: Marisa Davenport MRN: [id number] Date of Birth: [birth year]  Transition of Care [facility]) CM/SW Contact  Duwaine GORMAN Aran, LCSW Phone Number: 11/08/2023, 2:20 PM  Clinical Narrative: PT/OT evaluations recommended [facility] services. CSW spoke with patient's sister/POA, Marisa Davenport, regarding recommendations. Per sister, patient was receiving therapy at [facility] but was unsure if it was in-house or set up through another agency. Sister confirmed patient's wheelchair is at the ALF, so patient will need PTAR at discharge. CSW spoke with Garrie at [facility] and confirmed the therapies are provided in-house and patient can resume once she returns. Facility will need an updated FL2 and discharge summary prior to her return. TOC to follow.  Expected Discharge Plan: Assisted Living Barriers to Discharge: Continued Medical Work up  Expected Discharge Plan and Services In-house Referral: Clinical Social Work Post Acute Care Choice: NA Living arrangements for the past 2 months: Assisted Living Facility          DME Arranged: N/A DME Agency: NA  Social Determinants of Health (SDOH) Interventions SDOH Screenings   Food Insecurity: No Food Insecurity (11/07/2023)  Housing: Low Risk  (11/07/2023)  Transportation Needs: No Transportation Needs (11/07/2023)  Utilities: Not At Risk (11/07/2023)  Social Connections: Socially Isolated (11/07/2023)  Tobacco Use: Medium Risk (11/06/2023)   Readmission Risk Interventions     No data to display

## 2023-11-08 NOTE — Evaluation (Signed)
 Physical Therapy Evaluation Patient Details Name: Marisa Davenport MRN: [id number] DOB: [birth year] Today's Date: 11/08/2023  History of Present Illness  Patient is a 83 year old female who presented on 1/12 with AMS and fever. Patient was admitted with influenza A, severe sepsis, and acute metabolic encephalopathy. PMH: IDDM with hyperglycemia and hyperlipidemia, CVA L side weakness, obesity, dehydration  Clinical Impression  The patient is  pleasant and mobilizes with 1 person assist to trnasfer  to recliner..  Patient reports  transfers with +1  at ALF, and mobilizes in  a WC. Patient  should be able to return to ALF.    Pt admitted with above diagnosis.  Pt currently with functional limitations due to the deficits listed below (see PT Problem List). Pt will benefit from acute skilled PT to increase their independence and safety with mobility to allow discharge.       If plan is discharge home, recommend the following: A little help with walking and/or transfers;A little help with bathing/dressing/bathroom;Assist for transportation;Assistance with cooking/housework;Help with stairs or ramp for entrance   Can travel by private vehicle        Equipment Recommendations None recommended by PT  Recommendations for Other Services       Functional Status Assessment Patient has had a recent decline in their functional status and/or demonstrates limited ability to make significant improvements in function in a reasonable and predictable amount of time     Precautions / Restrictions Precautions Precautions: Fall Precaution Comments: L side weakness Restrictions Weight Bearing Restrictions Per Provider Order: No      Mobility  Bed Mobility               General bed mobility comments: with increased time, assistance to move  trunk to upright    Transfers Overall transfer level: Needs assistance Equipment used: 1 person hand held assist Transfers: Sit to/from Stand, Bed to  chair/wheelchair/BSC Sit to Stand: Min assist   Step pivot transfers: Mod assist       General transfer comment: stands from bed with HHA , steps to recliner, short shuffle step. Able to lift  self to scoot iver in the recliner    Ambulation/Gait                  Stairs            Wheelchair Mobility     Tilt Bed    Modified Rankin (Stroke Patients Only)       Balance Overall balance assessment: Mild deficits observed, not formally tested                                           Pertinent Vitals/Pain Pain Assessment Pain Assessment: No/denies pain    Home Living Family/patient expects to be discharged to:: Assisted living                 Home Equipment: Wheelchair - manual      Prior Function Prior Level of Function : Needs assist               ADLs Comments: has A for transfers with one HHA     Extremity/Trunk Assessment   Upper Extremity Assessment Upper Extremity Assessment: Defer to OT evaluation RUE Deficits / Details: patient was noted to ROM to about 70 ABduction. RUE Coordination: decreased fine motor LUE Deficits / Details: L side  weakness h/o stroke.    Lower Extremity Assessment Lower Extremity Assessment: LLE deficits/detail LLE Deficits / Details: able to lift leg, bear weight  to stand and step to transfer    Cervical / Trunk Assessment Cervical / Trunk Assessment: Normal  Communication   Communication Communication: No apparent difficulties  Cognition Arousal: Alert Behavior During Therapy: WFL for tasks assessed/performed Overall Cognitive Status: Within Functional Limits for tasks assessed                                 General Comments: plesant and cooperative        General Comments      Exercises     Assessment/Plan    PT Assessment Patient needs continued PT services  PT Problem List Decreased activity tolerance;Decreased safety awareness;Decreased  knowledge of precautions;Decreased balance       PT Treatment Interventions Therapeutic activities;Therapeutic exercise;Functional mobility training;Patient/family education    PT Goals (Current goals can be found in the Care Plan section)  Acute Rehab PT Goals Patient Stated Goal: go back to [employer] PT Goal Formulation: With patient Time For Goal Achievement: 11/22/23 Potential to Achieve Goals: Good    Frequency Min 1X/week     Co-evaluation PT/OT/SLP Co-Evaluation/Treatment: Yes Reason for Co-Treatment: For patient/therapist safety;To address functional/ADL transfers PT goals addressed during session: Mobility/safety with mobility         AM-PAC PT 6 Clicks Mobility  Outcome Measure Help needed turning from your back to your side while in a flat bed without using bedrails?: A Lot Help needed moving from lying on your back to sitting on the side of a flat bed without using bedrails?: A Lot Help needed moving to and from a bed to a chair (including a wheelchair)?: A Lot Help needed standing up from a chair using your arms (e.g., wheelchair or bedside chair)?: A Lot Help needed to walk in hospital room?: Total Help needed climbing 3-5 steps with a railing? : Total 6 Click Score: 10    End of Session Equipment Utilized During Treatment: Gait belt Activity Tolerance: Patient tolerated treatment well Patient left: in chair;with call bell/phone within reach;with chair alarm set;with nursing/sitter in room Nurse Communication: Mobility status PT Visit Diagnosis: Unsteadiness on feet (R26.81);Other symptoms and signs involving the nervous system (R29.898);Difficulty in walking, not elsewhere classified (R26.2)    Time: 9145-9083 PT Time Calculation (min) (ACUTE ONLY): 22 min   Charges:   PT Evaluation $PT Eval Low Complexity: 1 Low   PT General Charges $$ ACUTE PT VISIT: 1 Visit         Marisa Davenport PT Acute Rehabilitation Services Office [phone] Weekend  pager-[phone]   Marisa Davenport 11/08/2023, 1:47 PM

## 2023-11-08 NOTE — Evaluation (Signed)
 Occupational Therapy Evaluation Patient Details Name: Marisa Davenport MRN: [id number] DOB: Oct 02, 1941 Today's Date: 11/08/2023   History of Present Illness Patient is a 83 year old female who presented on 1/12 with AMS and fever. Patient was admitted with influenza A, severe sepsis, and acute metabolic encephalopathy. PMH: IDDM with hyperglycemia and hyperlipidemia, CVA L side weakness, obesity, dehydration   Clinical Impression   Patient is a 83 year old female who was admitted for above. Patient was living at ALF with assistance from caregivers for transfers and LB bath/dressing tasks. Patient was min A to transfer to recliner in room with increased time and cues with HHA. Patient plans to d/c back to ALF with HH when medially stable. Patient would continue to benefit from skilled OT services at this time while admitted and after d/c to address noted deficits in order to improve overall safety and independence in ADLs.        If plan is discharge home, recommend the following: A little help with walking and/or transfers;A lot of help with bathing/dressing/bathroom;Assistance with cooking/housework;Direct supervision/assist for medications management;Assist for transportation;Help with stairs or ramp for entrance;Direct supervision/assist for financial management    Functional Status Assessment  Patient has had a recent decline in their functional status and demonstrates the ability to make significant improvements in function in a reasonable and predictable amount of time.  Equipment Recommendations  None recommended by OT       Precautions / Restrictions Precautions Precaution Comments: L side weakness Restrictions Weight Bearing Restrictions Per Provider Order: No      Mobility Bed Mobility Overal bed mobility: Needs Assistance Bed Mobility: Supine to Sit     Supine to sit: Mod assist     General bed mobility comments: with increased time    Transfers                           Balance Overall balance assessment: Mild deficits observed, not formally tested             ADL either performed or assessed with clinical judgement   ADL Overall ADL's : Needs assistance/impaired Eating/Feeding: Set up;Sitting   Grooming: Sitting;Set up   Upper Body Bathing: Minimal assistance;Sitting   Lower Body Bathing: Sitting/lateral leans;Maximal assistance   Upper Body Dressing : Sitting;Minimal assistance   Lower Body Dressing: Sitting/lateral leans;Maximal assistance   Toilet Transfer: Minimal assistance;Stand-pivot Toilet Transfer Details (indicate cue type and reason): HHA to transfer to recliner with increased time. Toileting- Clothing Manipulation and Hygiene: Bed level;Total assistance Toileting - Clothing Manipulation Details (indicate cue type and reason): noted to be saturated from pure wic malfunction upon entrance to room. TD for hygiene. nurse made aware                          Pertinent Vitals/Pain Pain Assessment Pain Assessment: No/denies pain     Extremity/Trunk Assessment Upper Extremity Assessment Upper Extremity Assessment: RUE deficits/detail;LUE deficits/detail RUE Deficits / Details: patient was noted to ROM to about 70 ABduction. RUE Coordination: decreased fine motor LUE Deficits / Details: L side weakness h/o stroke.   Lower Extremity Assessment Lower Extremity Assessment: Defer to PT evaluation          Cognition Arousal: Alert Behavior During Therapy: WFL for tasks assessed/performed Overall Cognitive Status: Within Functional Limits for tasks assessed         General Comments: plesant and cooperative  Home Living Family/patient expects to be discharged to:: Assisted living           Home Equipment: Wheelchair - manual          Prior Functioning/Environment Prior Level of Function : Needs assist               ADLs Comments: has A for transfers with one HHA         OT Problem List: Impaired balance (sitting and/or standing);Decreased safety awareness;Decreased activity tolerance;Impaired UE functional use;Decreased knowledge of precautions      OT Treatment/Interventions: Self-care/ADL training;DME and/or AE instruction;Therapeutic activities;Balance training;Patient/family education    OT Goals(Current goals can be found in the care plan section) Acute Rehab OT Goals Patient Stated Goal: to get better OT Goal Formulation: With patient Time For Goal Achievement: 11/22/23 Potential to Achieve Goals: Fair  OT Frequency: Min 1X/week       AM-PAC OT 6 Clicks Daily Activity     Outcome Measure Help from another person eating meals?: A Little Help from another person taking care of personal grooming?: A Little Help from another person toileting, which includes using toliet, bedpan, or urinal?: A Lot Help from another person bathing (including washing, rinsing, drying)?: A Lot Help from another person to put on and taking off regular upper body clothing?: A Little Help from another person to put on and taking off regular lower body clothing?: A Lot 6 Click Score: 15   End of Session Equipment Utilized During Treatment: Gait belt Nurse Communication: Other (comment) (in room at end of session)  Activity Tolerance: Patient tolerated treatment well Patient left: in chair;with call bell/phone within reach;with chair alarm set  OT Visit Diagnosis: Unsteadiness on feet (R26.81);Other abnormalities of gait and mobility (R26.89)                Time: 9146-9083 OT Time Calculation (min): 23 min Charges:  OT General Charges $OT Visit: 1 Visit OT Evaluation $OT Eval Low Complexity: 1 Low  Gresham Caetano OTR/L, MS Acute Rehabilitation Department Office# [phone]   Marisa Davenport 11/08/2023, 1:41 PM

## 2023-11-09 DIAGNOSIS — R6520 Severe sepsis without septic shock: Secondary | ICD-10-CM | POA: Diagnosis not present

## 2023-11-09 DIAGNOSIS — A419 Sepsis, unspecified organism: Secondary | ICD-10-CM | POA: Diagnosis not present

## 2023-11-09 LAB — CBC
HCT: 30.3 % — ABNORMAL LOW (ref 36.0–46.0)
Hemoglobin: 8.6 g/dL — ABNORMAL LOW (ref 12.0–15.0)
MCH: 19 pg — ABNORMAL LOW (ref 26.0–34.0)
MCHC: 28.4 g/dL — ABNORMAL LOW (ref 30.0–36.0)
MCV: 67 fL — ABNORMAL LOW (ref 80.0–100.0)
Platelets: 181 10*3/uL (ref 150–400)
RBC: 4.52 MIL/uL (ref 3.87–5.11)
RDW: 25.6 % — ABNORMAL HIGH (ref 11.5–15.5)
WBC: 3 10*3/uL — ABNORMAL LOW (ref 4.0–10.5)
nRBC: 0.7 % — ABNORMAL HIGH (ref 0.0–0.2)

## 2023-11-09 LAB — GLUCOSE, CAPILLARY
Glucose-Capillary: 143 mg/dL — ABNORMAL HIGH (ref 70–99)
Glucose-Capillary: 142 mg/dL — ABNORMAL HIGH (ref 70–99)
Glucose-Capillary: 152 mg/dL — ABNORMAL HIGH (ref 70–99)
Glucose-Capillary: 164 mg/dL — ABNORMAL HIGH (ref 70–99)
Glucose-Capillary: 166 mg/dL — ABNORMAL HIGH (ref 70–99)

## 2023-11-09 LAB — GASTROINTESTINAL PANEL BY PCR, STOOL (REPLACES STOOL CULTURE)

## 2023-11-09 LAB — RENAL FUNCTION PANEL
Albumin: 3 g/dL — ABNORMAL LOW (ref 3.5–5.0)
Anion gap: 8 (ref 5–15)
BUN: 14 mg/dL (ref 8–23)
CO2: 23 mmol/L (ref 22–32)
Calcium: 8.4 mg/dL — ABNORMAL LOW (ref 8.9–10.3)
Chloride: 106 mmol/L (ref 98–111)
Creatinine, Ser: 0.95 mg/dL (ref 0.44–1.00)
GFR, Estimated: >60 mL/min (ref >60)
Glucose, Bld: 151 mg/dL — ABNORMAL HIGH (ref 70–99)
Phosphorus: 3.5 mg/dL (ref 2.5–4.6)
Potassium: 4.2 mmol/L (ref 3.5–5.1)
Sodium: 137 mmol/L (ref 135–145)

## 2023-11-09 LAB — MAGNESIUM: Magnesium: 2 mg/dL (ref 1.7–2.4)

## 2023-11-09 MED ORDER — POTASSIUM CHLORIDE CRYS ER 20 MEQ PO TBCR
40.0000 meq | EXTENDED_RELEASE_TABLET | ORAL | Status: AC
Start: 2023-11-09 — End: 2023-11-09
  Administered 2023-11-09 (×2): 40 meq via ORAL
  Filled 2023-11-09 (×2): qty 2

## 2023-11-09 NOTE — Progress Notes (Signed)
 Mobility Specialist - Progress Note   11/09/23 1327  Mobility  Activity Transferred from bed to chair  Level of Assistance Moderate assist, patient does 50-74%  Assistive Device Front wheel walker  Activity Response Tolerated well  Mobility Referral Yes  Mobility visit 1 Mobility  Mobility Specialist Start Time (ACUTE ONLY) 1313  Mobility Specialist Stop Time (ACUTE ONLY) 1326  Mobility Specialist Time Calculation (min) (ACUTE ONLY) 13 min   Pt received in bed and agreeable to transfer to recliner. Pt was modA from supine>sitting & modA from STS. No complaints during transfer.  Pt to recliner after session with all needs met.    [facility]

## 2023-11-09 NOTE — Progress Notes (Signed)
 [facility] Progress Note  Patient: Marisa Davenport     WUJ:[id number]  DOA: 11/06/2023   PCP: [facility]       Brief hospital course: This is an 83 year old female with non-Hodgkin's lymphoma (in remission) CVA, diabetes mellitus, hypertension, hyperlipidemia, coronary artery disease who presented to the hospital with a temperature of 103.5, tachycardia, tachypnea, elevated lactic acid and a hemoglobin of 7.3. She was found to be positive for influenza.  Subjective:  She has no complaints.   Assessment and Plan: Principal Problem:   Severe sepsis -secondary to influenza A - Continue Tamiflu   Active Problems:    Microcytic anemia -No overt bleeding noted but hemoglobin dropped to 6.8 and was given 1 unit of packed red blood cells and a dose of Venofer  on 1/13 -Iron  saturation was 5, ferritin was 6 - Hemoglobin is about 8-9 now  Diarrhea -?  If secondary to viral infection -C. difficile ordered and is pending    Acute metabolic encephalopathy- -improving    Hypomagnesemia   Hypophosphatemia Hypokalemia -Replacing  Diabetes mellitus, uncontrolled - Continue long and short acting insulin  -Sugars are reasonably controlled but last hemoglobin A1c on 1/13 was noted to be elevated at 10.4       Code Status: Limited: Do not attempt resuscitation (DNR) -DNR-LIMITED -Do Not Intubate/DNI  Total time on patient care: 35 min DVT prophylaxis:  SCDs Start: 11/06/23 1931     Objective:   Vitals:   11/08/23 1649 11/08/23 1939 11/09/23 0439 11/09/23 1233  BP: (!) 124/56 129/64 113/68 121/70  Pulse: 91 99 92 95  Resp: (!) 21 16 19 20   Temp: 97.6 F (36.4 C) 99.4 F (37.4 C) 98.8 F (37.1 C) 98.3 F (36.8 C)  TempSrc: Oral Oral Oral Oral  SpO2: 95% 94% 96% 95%  Weight:      Height:       Filed Weights   11/06/23 1447 11/07/23 0348  Weight: 92.2 kg 87 kg   Exam: General exam: Appears comfortable  HEENT: oral mucosa moist Respiratory system:  b/l wheezing and rhonchi Cardiovascular system: S1 & S2 heard  Gastrointestinal system: Abdomen soft, non-tender, nondistended. Normal bowel sounds   Extremities: No cyanosis, clubbing or edema Psychiatry:  Mood & affect appropriate.      CBC: Recent Labs  Lab 11/06/23 1434 11/06/23 2222 11/07/23 0504 11/08/23 0509 11/09/23 0526  WBC 4.7  --  3.0* 3.9* 3.0*  NEUTROABS 3.5  --   --  2.2  --   HGB 7.3* 7.6* 6.8* 8.6* 8.6*  HCT 26.2* 27.0* 24.6* 30.3* 30.3*  MCV 62.5*  --  62.0* 66.4* 67.0*  PLT 228  --  196 171 181   Basic Metabolic Panel: Recent Labs  Lab 11/06/23 1434 11/06/23 2124 11/07/23 0504 11/08/23 0509 11/09/23 0526  NA 134*  --  136 134* 137  K 3.5  --  3.4* 3.4* 4.2  CL 102  --  105 102 106  CO2 22  --  22 25 23   GLUCOSE 279*  --  214* 172* 151*  BUN 14  --  10 12 14   CREATININE 0.93  --  0.69 0.81 0.95  CALCIUM  8.2*  --  8.0* 8.0* 8.4*  MG  --  1.5* 1.8 2.0 2.0  PHOS  --  2.1* 3.7 2.9 3.5     Scheduled Meds:  aspirin  EC  81 mg Oral Daily   atorvastatin   40 mg Oral QHS   clopidogrel   75 mg Oral Daily  influenza vaccine adjuvanted  0.5 mL Intramuscular Tomorrow-1000   insulin  aspart  0-15 Units Subcutaneous Q4H   insulin  glargine-yfgn  22 Units Subcutaneous Daily   oseltamivir   30 mg Oral BID   pneumococcal 20-valent conjugate vaccine  0.5 mL Intramuscular Tomorrow-1000    Imaging and lab data personally reviewed   Author: Khloey Chern  11/09/2023 5:18 PM  To contact [facility]>   Check the care team in [facility] and look for the attending/consulting [facility] provider listed  Log into [email or web address] and use [address]'s universal password   Go to> "[facility]"  and find provider  If you still have difficulty reaching the provider, please page the [facility] (Director on Call) for the Hospitalists listed on amion

## 2023-11-09 NOTE — Plan of Care (Signed)
   Problem: Clinical Measurements: Goal: Ability to maintain clinical measurements within normal limits will improve Outcome: Progressing   Problem: Safety: Goal: Ability to remain free from injury will improve Outcome: Progressing   Problem: Skin Integrity: Goal: Risk for impaired skin integrity will decrease Outcome: Progressing

## 2023-11-10 DIAGNOSIS — A419 Sepsis, unspecified organism: Secondary | ICD-10-CM | POA: Diagnosis not present

## 2023-11-10 DIAGNOSIS — R6520 Severe sepsis without septic shock: Secondary | ICD-10-CM | POA: Diagnosis not present

## 2023-11-10 LAB — GLUCOSE, CAPILLARY
Glucose-Capillary: 136 mg/dL — ABNORMAL HIGH (ref 70–99)
Glucose-Capillary: 146 mg/dL — ABNORMAL HIGH (ref 70–99)
Glucose-Capillary: 170 mg/dL — ABNORMAL HIGH (ref 70–99)
Glucose-Capillary: 172 mg/dL — ABNORMAL HIGH (ref 70–99)

## 2023-11-10 MED ORDER — OSELTAMIVIR PHOSPHATE 30 MG PO CAPS: 30.0000 mg | ORAL_CAPSULE | Freq: Two times a day (BID) | ORAL | Status: DC

## 2023-11-10 MED ORDER — TRESIBA FLEXTOUCH 200 UNIT/ML ~~LOC~~ SOPN: 18.0000 [IU] | PEN_INJECTOR | Freq: Every day | SUBCUTANEOUS | Status: AC

## 2023-11-10 MED ORDER — FERROUS GLUCONATE 240 (27 FE) MG PO TABS: 240.0000 mg | ORAL_TABLET | Freq: Two times a day (BID) | ORAL | 0 refills | Status: AC

## 2023-11-10 MED ORDER — OSELTAMIVIR PHOSPHATE 30 MG PO CAPS: 30.0000 mg | ORAL_CAPSULE | Freq: Two times a day (BID) | ORAL | 0 refills | Status: AC

## 2023-11-10 NOTE — TOC Transition Note (Signed)
Transition of Care [facility]) - Discharge Note  Patient Details  Name: Marisa Davenport MRN: [id number] Date of Birth: 1940-11-27  [facility]) CM/SW Contact:  Ewing Schlein, LCSW Phone Number: 11/10/2023, 12:57 PM  Clinical Narrative: Patient is medically ready for discharge back to [facility]. FL2 completed. CSW notified Alexia Freestone and Tomma Lightning at [facility] that patient is medically ready. Discharge summary and FL2 faxed to facility for review [phone]). Tomma Lightning called CSW and confirmed the paperwork has been reviewed and patient can return today. The number for report is [phone]. Medical necessity form done; PTAR scheduled. Discharge packet completed. CSW notified sister, Alen Blew, regarding transportation being set up. RN updated. TOC signing off.    Final next level of care: Assisted Living Barriers to Discharge: Barriers Resolved  Patient Goals and CMS Choice Patient states their goals for this hospitalization and ongoing recovery are:: Return to [facility] Choice offered to / list presented to : NA  Discharge Placement         Patient chooses bed at: [facility] Patient to be transferred to facility by: PTAR Name of family member notified: Alen Blew (sister) Patient and family notified of of transfer: 11/10/23  Discharge Plan and Services Additional resources added to the After Visit Summary for   In-house Referral: Clinical Social Work Post Acute Care Choice: NA          DME Arranged: N/A DME Agency: NA  Social Drivers of Health (SDOH) Interventions SDOH Screenings   Food Insecurity: No Food Insecurity (11/07/2023)  Housing: Low Risk  (11/07/2023)  Transportation Needs: No Transportation Needs (11/07/2023)  Utilities: Not At Risk (11/07/2023)  Social Connections: Socially Isolated (11/07/2023)  Tobacco Use: Medium Risk (11/06/2023)   Readmission Risk Interventions     No data to display

## 2023-11-10 NOTE — Plan of Care (Signed)
  Problem: Coping: Goal: Ability to adjust to condition or change in health will improve Outcome: Progressing   Problem: Coping: Goal: Level of anxiety will decrease Outcome: Progressing   Problem: Safety: Goal: Ability to remain free from injury will improve Outcome: Progressing

## 2023-11-10 NOTE — [place] FL2 (Signed)
[place] MEDICAID FL2 LEVEL OF CARE FORM     IDENTIFICATION  Patient Name: Marisa Davenport Birthdate: 1941/09/21 Sex: female Admission Date (Current Location): 11/06/2023  [facility] and [place] Number:  Producer, television/film/video and Address:  [facility],  [address] [place], [place] [address]      Provider Number: [id number]  Attending Physician Name and Address:  Calvert Cantor, MD  Relative Name and Phone Number:  Alen Blew (sister) Ph: [phone]    Current Level of Care: Hospital Recommended Level of Care: Assisted Living Facility Prior Approval Number:    Date Approved/Denied:   PASRR Number:    Discharge Plan: Other (Comment) ([facility])    Current Diagnoses: Patient Active Problem List   Diagnosis Date Noted   Influenza A H1N1 infection 11/07/2023   Severe sepsis (HCC) 11/06/2023   Microcytic anemia 11/06/2023   Acute metabolic encephalopathy 11/06/2023   Hypomagnesemia 11/06/2023   Hypophosphatemia 11/06/2023   Cellulitis and abscess of lower extremity    Bacteremia due to Streptococcus 04/16/2016   Cellulitis of leg, left 04/16/2016   T2DM (type 2 diabetes mellitus) (HCC) 04/16/2016   Urinary incontinence 08/18/2012   Backache 07/14/2010   Type II or unspecified type diabetes mellitus without mention of complication, not stated as uncontrolled 08/13/2008   HYPERLIPIDEMIA 08/13/2008   Disorder of skin or subcutaneous tissue 06/19/2008   DE QUERVAIN'S TENOSYNOVITIS 06/19/2008   Allergic rhinitis 05/17/2007   LEG EDEMA, BILATERAL 05/17/2007   NON-HODGKIN'S LYMPHOMA, HX OF 05/17/2007    Orientation RESPIRATION BLADDER Height & Weight     Self, Place  Normal Incontinent Weight: 191 lb 12.8 oz (87 kg) Height:  5\' 3"  (160 cm)  BEHAVIORAL SYMPTOMS/MOOD NEUROLOGICAL BOWEL NUTRITION STATUS      Continent Diet (Carb modified diet)  AMBULATORY STATUS COMMUNICATION OF NEEDS Skin   Extensive Assist Verbally Skin abrasions, Other (Comment)  (Abrasion: left arm; Hematoma: left elbow; Erythema: abdomen, perineum; Ecchymosis: scattered)                       Personal Care Assistance Level of Assistance  Bathing, Feeding, Dressing Bathing Assistance: Limited assistance Feeding assistance: Limited assistance Dressing Assistance: Limited assistance     Functional Limitations Info  Sight, Hearing, Speech Sight Info: Impaired Hearing Info: Adequate Speech Info: Adequate    SPECIAL CARE FACTORS FREQUENCY  OT (By licensed OT), PT (By licensed PT)     PT Frequency: Min 1X/week OT Frequency: Min 1X/week            Contractures Contractures Info: Not present    Additional Factors Info  Code Status, Allergies, Insulin Sliding Scale Code Status Info: DNR Allergies Info: Penicillins   Insulin Sliding Scale Info: See discharge summary       Current Medications (11/10/2023):  This is the current hospital active medication list Current Facility-Administered Medications  Medication Dose Route Frequency Provider Last Rate Last Admin   acetaminophen (TYLENOL) tablet 650 mg  650 mg Oral Q6H PRN Steffanie Rainwater, MD   650 mg at 11/07/23 1308   Or   acetaminophen (TYLENOL) suppository 650 mg  650 mg Rectal Q6H PRN Steffanie Rainwater, MD   650 mg at 11/07/23 0231   aspirin EC tablet 81 mg  81 mg Oral Daily Steffanie Rainwater, MD   81 mg at 11/10/23 0831   atorvastatin (LIPITOR) tablet 40 mg  40 mg Oral QHS Steffanie Rainwater, MD   40 mg at 11/09/23 2106  clopidogrel (PLAVIX) tablet 75 mg  75 mg Oral Daily Steffanie Rainwater, MD   75 mg at 11/10/23 0831   insulin aspart (novoLOG) injection 0-15 Units  0-15 Units Subcutaneous Q4H Steffanie Rainwater, MD   2 Units at 11/10/23 0830   insulin glargine-yfgn (SEMGLEE) injection 22 Units  22 Units Subcutaneous Daily Candelaria Stagers T, MD   22 Units at 11/10/23 0838   labetalol (NORMODYNE) injection 10 mg  10 mg Intravenous Q2H PRN Almon Hercules, MD       ondansetron (ZOFRAN)  tablet 4 mg  4 mg Oral Q6H PRN Steffanie Rainwater, MD       Or   ondansetron (ZOFRAN) injection 4 mg  4 mg Intravenous Q6H PRN Steffanie Rainwater, MD       oseltamivir (TAMIFLU) capsule 30 mg  30 mg Oral BID Candelaria Stagers T, MD   30 mg at 11/10/23 0831   senna-docusate (Senokot-S) tablet 1 tablet  1 tablet Oral QHS PRN Steffanie Rainwater, MD         Discharge Medications: Please see discharge summary for a list of discharge medications.  aspirin EC 81 MG tablet Take 81 mg by mouth daily. Swallow whole.    atorvastatin 40 MG tablet Commonly known as: LIPITOR Take 40 mg by mouth at bedtime.    clopidogrel 75 MG tablet Commonly known as: PLAVIX Take 75 mg by mouth daily.    diphenhydramine-acetaminophen 25-500 MG Tabs tablet Commonly known as: TYLENOL PM Take 1 tablet by mouth at bedtime.    ferrous gluconate 240 (27 FE) MG tablet Commonly known as: FERGON Take 1 tablet (240 mg total) by mouth 2 (two) times daily before a meal.    mupirocin ointment 2 % Commonly known as: BACTROBAN Apply 1 Application topically daily. Right big toe    oseltamivir 30 MG capsule Commonly known as: TAMIFLU Take 1 capsule (30 mg total) by mouth 2 (two) times daily for 3 doses.    [name] FlexTouch 200 UNIT/ML FlexTouch Pen Generic drug: insulin degludec Inject 18 Units into the skin at bedtime.    Relevant Imaging Results:  Relevant Lab Results:   Additional Information SSN: [id number]  Ewing Schlein, LCSW

## 2023-11-10 NOTE — Plan of Care (Signed)
  Problem: Education: Goal: Knowledge of General Education information will improve Description: Including pain rating scale, medication(s)/side effects and non-pharmacologic comfort measures Outcome: Progressing   Problem: Clinical Measurements: Goal: Diagnostic test results will improve Outcome: Progressing Goal: Respiratory complications will improve Outcome: Progressing   Problem: Nutrition: Goal: Adequate nutrition will be maintained Outcome: Progressing   Problem: Coping: Goal: Level of anxiety will decrease Outcome: Progressing

## 2023-11-10 NOTE — Discharge Summary (Signed)
Physician Discharge Summary  Marisa Davenport ZOX:[id number] DOB: [birth year] DOA: 11/06/2023  PCP: [facility]  Admit date: 11/06/2023 Discharge date: 11/10/2023 Discharging to: ALF Recommendations for Outpatient Follow-up:  F/u on glucose control and anemia     Discharge Diagnoses:   Principal Problem:   Severe sepsis (HCC) Active Problems:   Influenza A H1N1 infection   Microcytic anemia   Acute metabolic encephalopathy   Hypomagnesemia   Hypophosphatemia     Hospital Course:  This is an 83 year old female with non-Hodgkin's lymphoma (in remission), h/o CVA, diabetes mellitus, hypertension, hyperlipidemia, coronary artery disease who presented to the hospital with a temperature of 103.5, tachycardia, tachypnea, elevated lactic acid at 2.0 and a hemoglobin of 7.3. She was found to be positive for influenza.  Principal Problem:   Severe sepsis -secondary to influenza A - currently no dyspnea or cough noted and sepsis symptoms resolved - Continue Tamiflu x 5 days- she has one more day remaining   Active Problems:     Microcytic anemia -No overt bleeding noted but hemoglobin dropped from 7.3 to 6.8 and was given 1 unit of packed red blood cells and a dose of Venofer on 1/13 -Iron saturation was 5, ferritin was 6 - Hemoglobin is about 8-9 now - Oral Iron ordered   Diarrhea -?  If secondary to viral infection- has resolved -C. difficile & GI pathogen panel negative     Acute metabolic encephalopathy- -improved and very oriented but still cannot remember why she was hospitalized     Hypomagnesemia   Hypophosphatemia   Hypokalemia -Replaced and likely secondary to acute infection   Diabetes mellitus, uncontrolled -Sugars are reasonably controlled but last hemoglobin A1c on 1/13 was noted to be elevated at 10.4 - long acting insulin increased from 18- 20 U  Obesity  Body mass index is 33.98 kg/m.            Discharge Instructions  Discharge  Instructions     Diet - low sodium heart healthy   Complete by: As directed    Diet Carb Modified   Complete by: As directed    Increase activity slowly   Complete by: As directed       Allergies as of 11/10/2023       Reactions   Penicillins Other (See Comments)   Hives        Medication List     TAKE these medications    aspirin EC 81 MG tablet Take 81 mg by mouth daily. Swallow whole.   atorvastatin 40 MG tablet Commonly known as: LIPITOR Take 40 mg by mouth at bedtime.   clopidogrel 75 MG tablet Commonly known as: PLAVIX Take 75 mg by mouth daily.   diphenhydramine-acetaminophen 25-500 MG Tabs tablet Commonly known as: TYLENOL PM Take 1 tablet by mouth at bedtime.   ferrous gluconate 240 (27 FE) MG tablet Commonly known as: FERGON Take 1 tablet (240 mg total) by mouth 2 (two) times daily before a meal.   mupirocin ointment 2 % Commonly known as: BACTROBAN Apply 1 Application topically daily. Right big toe   oseltamivir 30 MG capsule Commonly known as: TAMIFLU Take 1 capsule (30 mg total) by mouth 2 (two) times daily for 3 doses.   [name] FlexTouch 200 UNIT/ML FlexTouch Pen Generic drug: insulin degludec Inject 18 Units into the skin at bedtime.            The results of significant diagnostics from this hospitalization (including imaging, microbiology, ancillary and laboratory)  are listed below for reference.    CT ABDOMEN PELVIS W CONTRAST Result Date: 11/06/2023 CLINICAL DATA:  Fever and weakness. EXAM: CT ABDOMEN AND PELVIS WITH CONTRAST TECHNIQUE: Multidetector CT imaging of the abdomen and pelvis was performed using the standard protocol following bolus administration of intravenous contrast. RADIATION DOSE REDUCTION: This exam was performed according to the departmental dose-optimization program which includes automated exposure control, adjustment of the mA and/or kV according to patient size and/or use of iterative reconstruction technique.  CONTRAST:  OMNIPAQUE IOHEXOL 300 MG/ML  SOLN COMPARISON:  April 20, 2016 FINDINGS: Lower chest: Mild scarring and/or atelectasis is seen within the posterior aspect of the bilateral lung bases. Hepatobiliary: No focal liver abnormality is seen. No gallstones, gallbladder wall thickening, or biliary dilatation. Pancreas: Unremarkable. No pancreatic ductal dilatation or surrounding inflammatory changes. Spleen: Normal in size without focal abnormality. Adrenals/Urinary Tract: Adrenal glands are unremarkable. Kidneys are normal in size, without focal lesions. Stable prominence of an extrarenal pelvis is noted on the right. No renal calculi are identified. Bladder is unremarkable. Stomach/Bowel: Stomach is within normal limits. The appendix is surgically absent. No evidence of bowel wall thickening, distention, or inflammatory changes. Vascular/Lymphatic: Aortic atherosclerosis with marked severity calcification of the bilateral common iliac arteries. No enlarged abdominal or pelvic lymph nodes. Reproductive: Status post hysterectomy. No adnexal masses. Other: No abdominal wall hernia or abnormality. No abdominopelvic ascites. Musculoskeletal: Marked severity multilevel degenerative changes are seen throughout the lumbar spine. IMPRESSION: 1. No acute or active process within the abdomen or pelvis. 2. Evidence of prior hysterectomy and prior appendectomy. 3. Aortic atherosclerosis. Aortic Atherosclerosis (ICD10-I70.0). Electronically Signed   By: Aram Candela M.D.   On: 11/06/2023 20:27   CT HEAD WO CONTRAST ( ) Result Date: 11/06/2023 CLINICAL DATA:  Fever and weakness. EXAM: CT HEAD WITHOUT CONTRAST TECHNIQUE: Contiguous axial images were obtained from the base of the skull through the vertex without intravenous contrast. RADIATION DOSE REDUCTION: This exam was performed according to the departmental dose-optimization program which includes automated exposure control, adjustment of the mA and/or kV  according to patient size and/or use of iterative reconstruction technique. COMPARISON:  April 14, 2016 FINDINGS: Brain: There is moderate severity cerebral atrophy with widening of the extra-axial spaces and ventricular dilatation. There are areas of decreased attenuation within the white matter tracts of the supratentorial brain, consistent with microvascular disease changes. Vascular: Mild to moderate severity bilateral cavernous carotid artery calcification is noted. Skull: Negative for acute fracture or focal lesion. Sinuses/Orbits: No acute finding. Other: None. IMPRESSION: 1. No acute intracranial abnormality. 2. Generalized cerebral atrophy with chronic white matter small vessel ischemic changes. Electronically Signed   By: Aram Candela M.D.   On: 11/06/2023 20:10   DG Chest Port 1 View Result Date: 11/06/2023 CLINICAL DATA:  Questionable sepsis - evaluate for abnormality Weakness and fever. EXAM: PORTABLE CHEST 1 VIEW COMPARISON:  04/14/2016 FINDINGS: Right chest port in place. Normal heart size.The cardiomediastinal contours are normal. Aortic atherosclerosis. Pulmonary vasculature is normal. No consolidation, pleural effusion, or pneumothorax. No acute osseous abnormalities are seen. IMPRESSION: No active disease. Electronically Signed   By: Narda Rutherford M.D.   On: 11/06/2023 15:26   Labs:   Basic Metabolic Panel: Recent Labs  Lab 11/06/23 1434 11/06/23 2124 11/07/23 0504 11/08/23 0509 11/09/23 0526  NA 134*  --  136 134* 137  K 3.5  --  3.4* 3.4* 4.2  CL 102  --  105 102 106  CO2 22  --  22 25 23   GLUCOSE 279*  --  214* 172* 151*  BUN 14  --  10 12 14   CREATININE 0.93  --  0.69 0.81 0.95  CALCIUM 8.2*  --  8.0* 8.0* 8.4*  MG  --  1.5* 1.8 2.0 2.0  PHOS  --  2.1* 3.7 2.9 3.5     CBC: Recent Labs  Lab 11/06/23 1434 11/06/23 2222 11/07/23 0504 11/08/23 0509 11/09/23 0526  WBC 4.7  --  3.0* 3.9* 3.0*  NEUTROABS 3.5  --   --  2.2  --   HGB 7.3* 7.6* 6.8* 8.6* 8.6*   HCT 26.2* 27.0* 24.6* 30.3* 30.3*  MCV 62.5*  --  62.0* 66.4* 67.0*  PLT 228  --  196 171 181         SIGNED:   Calvert Cantor, MD  [facility] 11/10/2023, 10:26 AM

## 2023-11-11 LAB — CULTURE, BLOOD (ROUTINE X 2)
Culture: NO GROWTH
Culture: NO GROWTH
